# Patient Record
Sex: Male | Born: 1935 | Race: White | Hispanic: No | Marital: Married | Smoking: Former smoker
Health system: Southern US, Community
[De-identification: ages and names within clinical notes are randomized; demographics above are authoritative.]

## PROBLEM LIST (undated history)

## (undated) DIAGNOSIS — M19011 Primary osteoarthritis, right shoulder: Secondary | ICD-10-CM

## (undated) DIAGNOSIS — IMO0001 Reserved for inherently not codable concepts without codable children: Secondary | ICD-10-CM

## (undated) DIAGNOSIS — R2689 Other abnormalities of gait and mobility: Secondary | ICD-10-CM

## (undated) DIAGNOSIS — R058 Other specified cough: Secondary | ICD-10-CM

## (undated) DIAGNOSIS — R112 Nausea with vomiting, unspecified: Secondary | ICD-10-CM

## (undated) DIAGNOSIS — E785 Hyperlipidemia, unspecified: Secondary | ICD-10-CM

## (undated) DIAGNOSIS — M19012 Primary osteoarthritis, left shoulder: Secondary | ICD-10-CM

## (undated) DIAGNOSIS — J449 Chronic obstructive pulmonary disease, unspecified: Secondary | ICD-10-CM

## (undated) DIAGNOSIS — R251 Tremor, unspecified: Secondary | ICD-10-CM

## (undated) DIAGNOSIS — N400 Enlarged prostate without lower urinary tract symptoms: Secondary | ICD-10-CM

## (undated) DIAGNOSIS — J439 Emphysema, unspecified: Secondary | ICD-10-CM

## (undated) DIAGNOSIS — R350 Frequency of micturition: Secondary | ICD-10-CM

## (undated) DIAGNOSIS — I10 Essential (primary) hypertension: Secondary | ICD-10-CM

## (undated) DIAGNOSIS — J302 Other seasonal allergic rhinitis: Secondary | ICD-10-CM

## (undated) DIAGNOSIS — E039 Hypothyroidism, unspecified: Secondary | ICD-10-CM

## (undated) DIAGNOSIS — M2550 Pain in unspecified joint: Secondary | ICD-10-CM

## (undated) DIAGNOSIS — F419 Anxiety disorder, unspecified: Secondary | ICD-10-CM

## (undated) DIAGNOSIS — M1990 Unspecified osteoarthritis, unspecified site: Secondary | ICD-10-CM

## (undated) DIAGNOSIS — Z9289 Personal history of other medical treatment: Secondary | ICD-10-CM

## (undated) DIAGNOSIS — T8859XA Other complications of anesthesia, initial encounter: Secondary | ICD-10-CM

## (undated) DIAGNOSIS — T4145XA Adverse effect of unspecified anesthetic, initial encounter: Secondary | ICD-10-CM

## (undated) DIAGNOSIS — I2510 Atherosclerotic heart disease of native coronary artery without angina pectoris: Secondary | ICD-10-CM

## (undated) DIAGNOSIS — K5900 Constipation, unspecified: Secondary | ICD-10-CM

## (undated) DIAGNOSIS — R05 Cough: Secondary | ICD-10-CM

## (undated) DIAGNOSIS — Z9889 Other specified postprocedural states: Secondary | ICD-10-CM

## (undated) HISTORY — PX: CATARACT EXTRACTION: SUR2

## (undated) HISTORY — DX: Atherosclerotic heart disease of native coronary artery without angina pectoris: I25.10

## (undated) HISTORY — DX: Hyperlipidemia, unspecified: E78.5

## (undated) HISTORY — PX: HERNIA REPAIR: SHX51

---

## 1969-06-28 HISTORY — PX: OTHER SURGICAL HISTORY: SHX169

## 1993-08-28 HISTORY — PX: OTHER SURGICAL HISTORY: SHX169

## 2003-09-29 HISTORY — PX: LIPOMA EXCISION: SHX5283

## 2004-11-28 HISTORY — PX: PROSTATE BIOPSY: SHX241

## 2007-04-29 HISTORY — PX: COLONOSCOPY: SHX174

## 2008-11-28 HISTORY — PX: IRRIGATION AND DEBRIDEMENT SEBACEOUS CYST: SHX5255

## 2014-01-22 ENCOUNTER — Ambulatory Visit: Payer: Self-pay | Admitting: Cardiovascular Disease

## 2014-01-29 ENCOUNTER — Ambulatory Visit (INDEPENDENT_AMBULATORY_CARE_PROVIDER_SITE_OTHER): Payer: Managed Care, Other (non HMO) | Admitting: Cardiovascular Disease

## 2014-01-29 ENCOUNTER — Encounter: Payer: Self-pay | Admitting: Cardiovascular Disease

## 2014-01-29 VITALS — BP 121/70 | HR 62 | Ht 73.5 in | Wt 181.8 lb

## 2014-01-29 DIAGNOSIS — R079 Chest pain, unspecified: Secondary | ICD-10-CM

## 2014-01-29 DIAGNOSIS — R251 Tremor, unspecified: Secondary | ICD-10-CM

## 2014-01-29 DIAGNOSIS — R259 Unspecified abnormal involuntary movements: Secondary | ICD-10-CM

## 2014-01-29 DIAGNOSIS — I10 Essential (primary) hypertension: Secondary | ICD-10-CM | POA: Insufficient documentation

## 2014-01-29 DIAGNOSIS — I2510 Atherosclerotic heart disease of native coronary artery without angina pectoris: Secondary | ICD-10-CM | POA: Insufficient documentation

## 2014-01-29 MED ORDER — NITROGLYCERIN 0.4 MG SL SUBL: 0.4000 mg | SUBLINGUAL_TABLET | SUBLINGUAL | Status: DC | PRN

## 2014-01-29 NOTE — Assessment & Plan Note (Signed)
Fairly significant  In both Hands  Strong family history including brother with tremor No other signs of parkinsons  Offerred him referral to Dr Tat and he will let us know if he wants to be reevaluated

## 2014-01-29 NOTE — Assessment & Plan Note (Signed)
Distant history of cath with nonobstructive disease.  ASA and beta blocker.  No chest pain and normal ECG stable

## 2014-01-29 NOTE — Patient Instructions (Signed)
Your physician wants you to follow-up in:  6 MONTHS WITH DR NISHAN  You will receive a reminder letter in the mail two months in advance. If you don't receive a letter, please call our office to schedule the follow-up appointment. Your physician recommends that you continue on your current medications as directed. Please refer to the Current Medication list given to you today. 

## 2014-01-29 NOTE — Assessment & Plan Note (Signed)
Well controlled.  Continue current medications and low sodium Dash type diet.    

## 2014-01-29 NOTE — Progress Notes (Signed)
Patient ID: Jay Bradshaw, male   DOB: 06-Dec-1934, 79 y.o.   MRN: [id number]    79 yo moving from [place]  Wanted to "establish" with heart doctor.  Describes having cath about 8 years ago  No intervention done  ? Moderate disease.  No repeat cath since that time.  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  Has been on beta blocker for HTN and since heart cath.  Takes ASA  No dyspnea, palpitations or syncope Compliant with meds.  Does not smoke or drink  No chest pain      ROS: Denies fever, malais, weight loss, blurry vision, decreased visual acuity, cough, sputum, SOB, hemoptysis, pleuritic pain, palpitaitons, heartburn, abdominal pain, melena, lower extremity edema, claudication, or rash.  All other systems reviewed and negative   General: Affect appropriate Healthy:  appears stated age 79: normal Neck supple with no adenopathy JVP normal no bruits no thyromegaly Lungs clear with no wheezing and good diaphragmatic motion Heart:  S1/S2 no murmur,rub, gallop or click PMI normal Abdomen: benighn, BS positve, no tenderness, no AAA no bruit.  No HSM or HJR Distal pulses intact with no bruits No edema Neuro non-focal  Intention tremor both UEls  Skin warm and dry No muscular weakness  Medications Current Outpatient Prescriptions  Medication Sig Dispense Refill  . nitroGLYCERIN (NITROSTAT) 0.4 MG SL tablet Place 1 tablet (0.4 mg total) under the tongue every 5 (five) minutes as needed for chest pain.  25 tablet  4  . alendronate (FOSAMAX) 70 MG tablet Take 70 mg by mouth once a week. Take with a full glass of water on an empty stomach.      Marland Kitchen aspirin 81 MG tablet Take 81 mg by mouth daily.      Marland Kitchen CALCIUM-MAGNESIUM PO Take 500-600 mg by mouth 2 (two) times daily.      . Cholecalciferol (VITAMIN D-3 PO) Take 1,000 Units by mouth 2 (two)  times daily.      . Coenzyme Q10 (CO Q10) 100 MG CAPS Take 200 mg by mouth daily.      Marland Kitchen levothyroxine (SYNTHROID, LEVOTHROID) 88 MCG tablet Take 88 mcg by mouth daily before breakfast.      . lovastatin (MEVACOR) 20 MG tablet Take 20 mg by mouth at bedtime.      . LUTEIN PO Take 200 mg by mouth daily.      . metoprolol succinate (TOPROL-XL) 50 MG 24 hr tablet Take 50 mg by mouth daily. Take with or immediately following a meal.      . Multiple Vitamin (MULTIVITAMIN) capsule Take 1 capsule by mouth daily.      Marland Kitchen oxybutynin (DITROPAN) 5 MG tablet Take 2.5 mg by mouth 2 (two) times daily.      . PSYLLIUM HUSK PO Take by mouth. 3-6 per day      . saw palmetto 160 MG capsule Take 160 mg by mouth 3 (three) times daily.      . tamsulosin (FLOMAX) 0.4 MG CAPS capsule Take 0.4 mg by mouth daily.      . vitamin C (ASCORBIC ACID) 500 MG tablet Take 500 mg by mouth daily.      . vitamin E (VITAMIN E) 400 UNIT capsule Take 300 Units by mouth daily.       No  current facility-administered medications for this visit.    Allergies Review of patient's allergies indicates no known allergies.  Family History: Family History  Problem Relation Age of Onset  . Hypertension Mother     Social History: History   Social History  . Marital Status: Married    Spouse Name: N/A    Number of Children: N/A  . Years of Education: N/A   Occupational History  . Not on file.   Social History Main Topics  . Smoking status: Former Research scientist (life sciences)  . Smokeless tobacco: Not on file     Comment: quit 1977  . Alcohol Use: Not on file  . Drug Use: Not on file  . Sexual Activity: Not on file   Other Topics Concern  . Not on file   Social History Narrative  . No narrative on file    Electrocardiogram:  SR rate 62 normal ECG   Assessment and Plan

## 2014-08-18 ENCOUNTER — Ambulatory Visit: Payer: Managed Care, Other (non HMO) | Admitting: Cardiovascular Disease

## 2014-08-28 ENCOUNTER — Other Ambulatory Visit: Payer: Self-pay | Admitting: Orthopedic Surgery

## 2014-08-28 DIAGNOSIS — M25512 Pain in left shoulder: Secondary | ICD-10-CM

## 2014-08-28 DIAGNOSIS — M25511 Pain in right shoulder: Secondary | ICD-10-CM

## 2014-09-03 ENCOUNTER — Other Ambulatory Visit: Payer: Managed Care, Other (non HMO)

## 2014-09-03 ENCOUNTER — Ambulatory Visit
Admission: RE | Admit: 2014-09-03 | Discharge: 2014-09-03 | Disposition: A | Discharge status: Home / Self Care | Payer: Managed Care, Other (non HMO) | Source: Ambulatory Visit | Attending: Orthopedic Surgery | Admitting: Orthopedic Surgery

## 2014-09-03 ENCOUNTER — Other Ambulatory Visit: Payer: Self-pay | Admitting: Orthopedic Surgery

## 2014-09-03 DIAGNOSIS — M25512 Pain in left shoulder: Secondary | ICD-10-CM

## 2014-09-03 DIAGNOSIS — M25511 Pain in right shoulder: Secondary | ICD-10-CM

## 2014-09-17 ENCOUNTER — Encounter: Payer: Self-pay | Admitting: Cardiovascular Disease

## 2014-09-17 ENCOUNTER — Ambulatory Visit (INDEPENDENT_AMBULATORY_CARE_PROVIDER_SITE_OTHER): Payer: Managed Care, Other (non HMO) | Admitting: Cardiovascular Disease

## 2014-09-17 VITALS — BP 122/58 | HR 63 | Ht 73.0 in | Wt 176.0 lb

## 2014-09-17 DIAGNOSIS — Z01810 Encounter for preprocedural cardiovascular examination: Secondary | ICD-10-CM

## 2014-09-17 DIAGNOSIS — R251 Tremor, unspecified: Secondary | ICD-10-CM

## 2014-09-17 DIAGNOSIS — I2583 Coronary atherosclerosis due to lipid rich plaque: Secondary | ICD-10-CM

## 2014-09-17 DIAGNOSIS — I2510 Atherosclerotic heart disease of native coronary artery without angina pectoris: Secondary | ICD-10-CM

## 2014-09-17 DIAGNOSIS — I159 Secondary hypertension, unspecified: Secondary | ICD-10-CM

## 2014-09-17 NOTE — Assessment & Plan Note (Signed)
Well controlled.  Continue current medications and low sodium Dash type diet.    

## 2014-09-17 NOTE — Assessment & Plan Note (Signed)
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

## 2014-09-17 NOTE — Assessment & Plan Note (Signed)
Stable with no angina and good activity level.  Continue medical Rx Never documented to be obstructive

## 2014-09-17 NOTE — Assessment & Plan Note (Signed)
No symptoms, normal ECG and cardiac exam clear to have general anesthesia and shoulder surgery with DR Mardelle Matte

## 2014-09-17 NOTE — Patient Instructions (Signed)
Your physician wants you to follow-up in: YEAR WITH DR NISHAN  You will receive a reminder letter in the mail two months in advance. If you don't receive a letter, please call our office to schedule the follow-up appointment.  Your physician recommends that you continue on your current medications as directed. Please refer to the Current Medication list given to you today. 

## 2014-09-17 NOTE — Progress Notes (Signed)
Patient ID: Jay Bradshaw, male   DOB: 1935-03-15, 79 y.o.   MRN: [id number] 79 yo moving from [place] Wanted to "establish" with heart doctor. Describes having cath about 8 years ago No intervention done ? Moderate disease. No repeat cath since that time. 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 Has been on beta blocker for HTN and since heart cath. Takes ASA No dyspnea, palpitations or syncope Compliant with meds. Does not smoke or drink No chest pain    2/15 TC 147  LDL 87  Normal LFTls   Needs shoulder surgery with Dr Mardelle Matte in January Ok from cardiac standpoint    ROS: Denies fever, malais, weight loss, blurry vision, decreased visual acuity, cough, sputum, SOB, hemoptysis, pleuritic pain, palpitaitons, heartburn, abdominal pain, melena, lower extremity edema, claudication, or rash.  All other systems reviewed and negative  General: Affect appropriate Healthy:  appears stated age 79: normal Neck supple with no adenopathy JVP normal no bruits no thyromegaly Lungs clear with no wheezing and good diaphragmatic motion Heart:  S1/S2 no murmur, no rub, gallop or click PMI normal Abdomen: benighn, BS positve, no tenderness, no AAA no bruit.  No HSM or HJR Distal pulses intact with no bruits No edema Neuro non-focal UE tremor  Skin warm and dry Decreased ROM UE;s right greater than left    Current Outpatient Prescriptions  Medication Sig Dispense Refill  . alendronate (FOSAMAX) 70 MG tablet Take 70 mg by mouth once a week. Take with a full glass of water on an empty stomach.      Marland Kitchen aspirin 81 MG tablet Take 81 mg by mouth daily.      Marland Kitchen CALCIUM-MAGNESIUM PO Take 500-600 mg by mouth 2 (two) times daily.      . Cholecalciferol (VITAMIN D-3 PO) Take 1,000 Units by mouth 2 (two) times daily.      . Coenzyme Q10 (CO Q10) 100 MG CAPS Take 200 mg  by mouth daily.      Marland Kitchen levothyroxine (SYNTHROID, LEVOTHROID) 88 MCG tablet Take 88 mcg by mouth daily before breakfast.      . lovastatin (MEVACOR) 20 MG tablet Take 20 mg by mouth at bedtime.      . LUTEIN PO Take 200 mg by mouth daily.      . metoprolol succinate (TOPROL-XL) 50 MG 24 hr tablet Take 50 mg by mouth daily. Take with or immediately following a meal.      . Multiple Vitamin (MULTIVITAMIN) capsule Take 1 capsule by mouth daily.      . nitroGLYCERIN (NITROSTAT) 0.4 MG SL tablet Place 1 tablet (0.4 mg total) under the tongue every 5 (five) minutes as needed for chest pain.  25 tablet  4  . oxybutynin (DITROPAN) 5 MG tablet Take 2.5 mg by mouth 2 (two) times daily.      . PSYLLIUM HUSK PO Take by mouth. 3-6 per day      . saw palmetto 160 MG capsule Take 160 mg by mouth 3 (three) times daily.      . tamsulosin (FLOMAX) 0.4 MG CAPS capsule Take 0.4 mg by mouth daily.      . vitamin C (ASCORBIC ACID) 500 MG tablet Take 500 mg by mouth daily.      . vitamin E (VITAMIN E) 400 UNIT capsule Take 300 Units  by mouth daily.       No current facility-administered medications for this visit.    Allergies  Review of patient's allergies indicates no known allergies.  Electrocardiogram:  NSR normal ECG 2014  Today NSR rate 61 normal no change   Assessment and Plan

## 2014-09-18 ENCOUNTER — Telehealth: Payer: Self-pay | Admitting: Cardiovascular Disease

## 2014-09-18 NOTE — Telephone Encounter (Signed)
Received request from Nurse fax box, documents faxed for surgical clearance. To: Raliegh Ip  Fax number: [phone] Attention:  [phone]/[id number].[place]

## 2014-11-17 ENCOUNTER — Telehealth: Payer: Self-pay | Admitting: Cardiovascular Disease

## 2014-11-17 NOTE — Telephone Encounter (Signed)
ROI faxed to [facility] Dr.[facility] @ [phone] [place]

## 2014-12-02 ENCOUNTER — Telehealth: Payer: Self-pay | Admitting: Cardiovascular Disease

## 2014-12-02 NOTE — Telephone Encounter (Signed)
Records received from [facility] gave to Chart prep team

## 2014-12-02 NOTE — Pre-Procedure Instructions (Signed)
Jay Bradshaw  12/02/2014   Your procedure is scheduled on:  12/16/14  Report to [facility] Admitting at 530 AM.  Call this number if you have problems the morning of surgery: [phone]   Remember:   Do not eat food or drink liquids after midnight.   Take these medicines the morning of surgery with A SIP OF WATER: synthroid,metoprolol,ditropan,flomax   Do not wear jewelry, make-up or nail polish.  Do not wear lotions, powders, or perfumes. You may wear deodorant.  Do not shave 48 hours prior to surgery. Men may shave face and neck.  Do not bring valuables to the hospital.  [facility] is not responsible                  for any belongings or valuables.               Contacts, dentures or bridgework may not be worn into surgery.  Leave suitcase in the car. After surgery it may be brought to your room.  For patients admitted to the hospital, discharge time is determined by your                treatment team.               Patients discharged the day of surgery will not be allowed to drive  home.  Name and phone number of your driver: family  Special Instructions: Incentive Spirometry - Practice and bring it with you on the day of surgery.   Please read over the following fact sheets that you were given: Pain Booklet, Coughing and Deep Breathing, Blood Transfusion Information and MRSA Information

## 2014-12-03 ENCOUNTER — Encounter (HOSPITAL_COMMUNITY): Payer: Self-pay

## 2014-12-03 ENCOUNTER — Encounter (HOSPITAL_COMMUNITY)
Admission: RE | Admit: 2014-12-03 | Discharge: 2014-12-03 | Disposition: A | Discharge status: Home / Self Care | Payer: Medicare HMO | Source: Ambulatory Visit | Attending: Orthopedic Surgery | Admitting: Orthopedic Surgery

## 2014-12-03 DIAGNOSIS — Z01812 Encounter for preprocedural laboratory examination: Secondary | ICD-10-CM | POA: Insufficient documentation

## 2014-12-03 HISTORY — DX: Unspecified osteoarthritis, unspecified site: M19.90

## 2014-12-03 HISTORY — DX: Tremor, unspecified: R25.1

## 2014-12-03 HISTORY — DX: Chronic obstructive pulmonary disease, unspecified: J44.9

## 2014-12-03 HISTORY — DX: Reserved for inherently not codable concepts without codable children: IMO0001

## 2014-12-03 HISTORY — DX: Hypothyroidism, unspecified: E03.9

## 2014-12-03 LAB — CBC
HCT: 41.7 % (ref 39.0–52.0)
HEMOGLOBIN: 13.9 g/dL (ref 13.0–17.0)
MCH: 30.6 pg (ref 26.0–34.0)
MCHC: 33.3 g/dL (ref 30.0–36.0)
MCV: 91.9 fL (ref 78.0–100.0)
Platelets: 258 10*3/uL (ref 150–400)
RBC: 4.54 MIL/uL (ref 4.22–5.81)
RDW: 13.6 % (ref 11.5–15.5)
WBC: 6.7 10*3/uL (ref 4.0–10.5)

## 2014-12-03 LAB — BASIC METABOLIC PANEL
Anion gap: 5 (ref 5–15)
BUN: 25 mg/dL — ABNORMAL HIGH (ref 6–23)
CALCIUM: 9.2 mg/dL (ref 8.4–10.5)
CHLORIDE: 106 meq/L (ref 96–112)
CO2: 29 mmol/L (ref 19–32)
Creatinine, Ser: 0.98 mg/dL (ref 0.50–1.35)
GFR calc Af Amer: 90 mL/min — ABNORMAL LOW (ref >90)
GFR, EST NON AFRICAN AMERICAN: 77 mL/min — AB (ref >90)
Glucose, Bld: 89 mg/dL (ref 70–99)
Potassium: 4.6 mmol/L (ref 3.5–5.1)
SODIUM: 140 mmol/L (ref 135–145)

## 2014-12-03 LAB — SURGICAL PCR SCREEN
MRSA, PCR: NEGATIVE
STAPHYLOCOCCUS AUREUS: POSITIVE — AB

## 2014-12-15 MED ORDER — CEFAZOLIN SODIUM-DEXTROSE 2-3 GM-% IV SOLR
2.0000 g | INTRAVENOUS | Status: AC
Start: 2014-12-16 — End: 2014-12-16
  Administered 2014-12-16: 2 g via INTRAVENOUS
  Filled 2014-12-15: qty 50

## 2014-12-16 ENCOUNTER — Inpatient Hospital Stay (HOSPITAL_COMMUNITY): Payer: Medicare HMO

## 2014-12-16 ENCOUNTER — Inpatient Hospital Stay (HOSPITAL_COMMUNITY)
Admission: RE | Admit: 2014-12-16 | Discharge: 2014-12-18 | DRG: 483 | Disposition: A | Discharge status: Home Health | Payer: Medicare HMO | Source: Ambulatory Visit | Attending: Orthopedic Surgery | Admitting: Orthopedic Surgery

## 2014-12-16 ENCOUNTER — Inpatient Hospital Stay (HOSPITAL_COMMUNITY): Payer: Medicare HMO | Admitting: Anesthesiology

## 2014-12-16 ENCOUNTER — Encounter (HOSPITAL_COMMUNITY): Payer: Self-pay | Admitting: *Deleted

## 2014-12-16 ENCOUNTER — Encounter (HOSPITAL_COMMUNITY)
Admission: RE | Disposition: A | Discharge status: Home Health | Payer: Self-pay | Source: Ambulatory Visit | Attending: Orthopedic Surgery

## 2014-12-16 DIAGNOSIS — Z79899 Other long term (current) drug therapy: Secondary | ICD-10-CM | POA: Diagnosis not present

## 2014-12-16 DIAGNOSIS — Z7982 Long term (current) use of aspirin: Secondary | ICD-10-CM

## 2014-12-16 DIAGNOSIS — R338 Other retention of urine: Secondary | ICD-10-CM | POA: Diagnosis present

## 2014-12-16 DIAGNOSIS — I10 Essential (primary) hypertension: Secondary | ICD-10-CM | POA: Diagnosis present

## 2014-12-16 DIAGNOSIS — Z87891 Personal history of nicotine dependence: Secondary | ICD-10-CM | POA: Diagnosis not present

## 2014-12-16 DIAGNOSIS — N9989 Other postprocedural complications and disorders of genitourinary system: Secondary | ICD-10-CM | POA: Diagnosis present

## 2014-12-16 DIAGNOSIS — Z96611 Presence of right artificial shoulder joint: Secondary | ICD-10-CM

## 2014-12-16 DIAGNOSIS — Z8249 Family history of ischemic heart disease and other diseases of the circulatory system: Secondary | ICD-10-CM

## 2014-12-16 DIAGNOSIS — Z96619 Presence of unspecified artificial shoulder joint: Secondary | ICD-10-CM

## 2014-12-16 DIAGNOSIS — I2510 Atherosclerotic heart disease of native coronary artery without angina pectoris: Secondary | ICD-10-CM | POA: Diagnosis present

## 2014-12-16 DIAGNOSIS — J449 Chronic obstructive pulmonary disease, unspecified: Secondary | ICD-10-CM | POA: Diagnosis present

## 2014-12-16 DIAGNOSIS — M19011 Primary osteoarthritis, right shoulder: Secondary | ICD-10-CM | POA: Diagnosis present

## 2014-12-16 DIAGNOSIS — E785 Hyperlipidemia, unspecified: Secondary | ICD-10-CM | POA: Diagnosis present

## 2014-12-16 DIAGNOSIS — E039 Hypothyroidism, unspecified: Secondary | ICD-10-CM | POA: Diagnosis present

## 2014-12-16 HISTORY — PX: SHOULDER HEMI-ARTHROPLASTY: SHX5049

## 2014-12-16 HISTORY — PX: SHOULDER SURGERY: SHX246

## 2014-12-16 HISTORY — DX: Primary osteoarthritis, right shoulder: M19.011

## 2014-12-16 HISTORY — DX: Other complications of anesthesia, initial encounter: T88.59XA

## 2014-12-16 HISTORY — DX: Adverse effect of unspecified anesthetic, initial encounter: T41.45XA

## 2014-12-16 HISTORY — PX: TOTAL SHOULDER ARTHROPLASTY: SHX126

## 2014-12-16 HISTORY — DX: Other specified postprocedural states: Z98.890

## 2014-12-16 HISTORY — DX: Nausea with vomiting, unspecified: R11.2

## 2014-12-16 SURGERY — ARTHROPLASTY, SHOULDER, TOTAL
Anesthesia: Regional | Site: Shoulder | Laterality: Right

## 2014-12-16 MED ORDER — METOCLOPRAMIDE HCL 5 MG/ML IJ SOLN: 5.0000 mg | Freq: Three times a day (TID) | INTRAMUSCULAR | Status: DC | PRN

## 2014-12-16 MED ORDER — POTASSIUM CHLORIDE IN NACL 20-0.45 MEQ/L-% IV SOLN
INTRAVENOUS | Status: DC
  Administered 2014-12-16: 20:00:00 via INTRAVENOUS
  Filled 2014-12-16 (×6): qty 1000

## 2014-12-16 MED ORDER — ONDANSETRON HCL 4 MG/2ML IJ SOLN
INTRAMUSCULAR | Status: AC
  Filled 2014-12-16: qty 2

## 2014-12-16 MED ORDER — LEVOTHYROXINE SODIUM 88 MCG PO TABS
88.0000 ug | ORAL_TABLET | Freq: Every day | ORAL | Status: DC
  Administered 2014-12-16 – 2014-12-17 (×2): 88 ug via ORAL
  Filled 2014-12-16 (×4): qty 1

## 2014-12-16 MED ORDER — CEFAZOLIN SODIUM 1-5 GM-% IV SOLN
1.0000 g | Freq: Four times a day (QID) | INTRAVENOUS | Status: AC
  Administered 2014-12-16 – 2014-12-17 (×3): 1 g via INTRAVENOUS
  Filled 2014-12-16 (×3): qty 50

## 2014-12-16 MED ORDER — VITAMIN E 45 MG (100 UNIT) PO CAPS
300.0000 [IU] | ORAL_CAPSULE | ORAL | Status: DC
  Administered 2014-12-17: 300 [IU] via ORAL
  Filled 2014-12-16: qty 3

## 2014-12-16 MED ORDER — MIDAZOLAM HCL 5 MG/5ML IJ SOLN
INTRAMUSCULAR | Status: DC | PRN
  Administered 2014-12-16 (×2): 1 mg via INTRAVENOUS

## 2014-12-16 MED ORDER — LIDOCAINE HCL (CARDIAC) 20 MG/ML IV SOLN
INTRAVENOUS | Status: DC | PRN
  Administered 2014-12-16: 100 mg via INTRAVENOUS

## 2014-12-16 MED ORDER — GLYCOPYRROLATE 0.2 MG/ML IJ SOLN
INTRAMUSCULAR | Status: AC
  Filled 2014-12-16: qty 2

## 2014-12-16 MED ORDER — FENTANYL CITRATE 0.05 MG/ML IJ SOLN
INTRAMUSCULAR | Status: AC
  Filled 2014-12-16: qty 5

## 2014-12-16 MED ORDER — LIDOCAINE HCL (CARDIAC) 20 MG/ML IV SOLN
INTRAVENOUS | Status: AC
  Filled 2014-12-16: qty 5

## 2014-12-16 MED ORDER — ONDANSETRON HCL 4 MG PO TABS
4.0000 mg | ORAL_TABLET | Freq: Four times a day (QID) | ORAL | Status: DC | PRN
  Administered 2014-12-18: 4 mg via ORAL
  Filled 2014-12-16: qty 1

## 2014-12-16 MED ORDER — MAGNESIUM CITRATE PO SOLN: 1.0000 | Freq: Once | ORAL | Status: AC | PRN

## 2014-12-16 MED ORDER — METHOCARBAMOL 1000 MG/10ML IJ SOLN: 500.0000 mg | Freq: Four times a day (QID) | INTRAVENOUS | Status: DC | PRN

## 2014-12-16 MED ORDER — OXYBUTYNIN CHLORIDE 5 MG PO TABS
2.5000 mg | ORAL_TABLET | Freq: Two times a day (BID) | ORAL | Status: DC
  Filled 2014-12-16 (×7): qty 0.5

## 2014-12-16 MED ORDER — DEXAMETHASONE SODIUM PHOSPHATE 4 MG/ML IJ SOLN
INTRAMUSCULAR | Status: DC | PRN
  Administered 2014-12-16: 8 mg via INTRAVENOUS

## 2014-12-16 MED ORDER — GLYCOPYRROLATE 0.2 MG/ML IJ SOLN
INTRAMUSCULAR | Status: DC | PRN
  Administered 2014-12-16: 0.2 mg via INTRAVENOUS
  Administered 2014-12-16: 0.4 mg via INTRAVENOUS

## 2014-12-16 MED ORDER — MELATONIN 3 MG PO TABS: 1.0000 | ORAL_TABLET | Freq: Every evening | ORAL | Status: DC | PRN

## 2014-12-16 MED ORDER — ACETAMINOPHEN 650 MG RE SUPP: 650.0000 mg | Freq: Four times a day (QID) | RECTAL | Status: DC | PRN

## 2014-12-16 MED ORDER — METOPROLOL SUCCINATE ER 50 MG PO TB24
50.0000 mg | ORAL_TABLET | Freq: Every day | ORAL | Status: DC
  Administered 2014-12-16 – 2014-12-18 (×3): 50 mg via ORAL
  Filled 2014-12-16 (×3): qty 1

## 2014-12-16 MED ORDER — EPHEDRINE SULFATE 50 MG/ML IJ SOLN
INTRAMUSCULAR | Status: DC | PRN
  Administered 2014-12-16 (×5): 10 mg via INTRAVENOUS

## 2014-12-16 MED ORDER — ONDANSETRON HCL 4 MG/2ML IJ SOLN
4.0000 mg | Freq: Four times a day (QID) | INTRAMUSCULAR | Status: DC | PRN
  Administered 2014-12-16: 4 mg via INTRAVENOUS
  Filled 2014-12-16: qty 2

## 2014-12-16 MED ORDER — NITROGLYCERIN 0.4 MG SL SUBL: 0.4000 mg | SUBLINGUAL_TABLET | SUBLINGUAL | Status: DC | PRN

## 2014-12-16 MED ORDER — FENTANYL CITRATE 0.05 MG/ML IJ SOLN
INTRAMUSCULAR | Status: DC | PRN
  Administered 2014-12-16 (×2): 50 ug via INTRAVENOUS

## 2014-12-16 MED ORDER — MENTHOL 3 MG MT LOZG
1.0000 | LOZENGE | OROMUCOSAL | Status: DC | PRN
  Administered 2014-12-16: 1 via ORAL
  Filled 2014-12-16: qty 9

## 2014-12-16 MED ORDER — SENNA-DOCUSATE SODIUM 8.6-50 MG PO TABS: 2.0000 | ORAL_TABLET | Freq: Every day | ORAL | Status: DC

## 2014-12-16 MED ORDER — VITAMIN C 500 MG PO TABS
500.0000 mg | ORAL_TABLET | ORAL | Status: DC
  Administered 2014-12-17: 500 mg via ORAL
  Filled 2014-12-16: qty 1

## 2014-12-16 MED ORDER — OXYCODONE-ACETAMINOPHEN 10-325 MG PO TABS: 1.0000 | ORAL_TABLET | Freq: Four times a day (QID) | ORAL | Status: DC | PRN

## 2014-12-16 MED ORDER — NEOSTIGMINE METHYLSULFATE 10 MG/10ML IV SOLN
INTRAVENOUS | Status: DC | PRN
  Administered 2014-12-16: 3 mg via INTRAVENOUS

## 2014-12-16 MED ORDER — NEOSTIGMINE METHYLSULFATE 10 MG/10ML IV SOLN
INTRAVENOUS | Status: AC
  Filled 2014-12-16: qty 1

## 2014-12-16 MED ORDER — MIDAZOLAM HCL 2 MG/2ML IJ SOLN
INTRAMUSCULAR | Status: AC
  Filled 2014-12-16: qty 2

## 2014-12-16 MED ORDER — BUPIVACAINE-EPINEPHRINE (PF) 0.25% -1:200000 IJ SOLN
INTRAMUSCULAR | Status: AC
  Filled 2014-12-16: qty 30

## 2014-12-16 MED ORDER — ONDANSETRON HCL 4 MG/2ML IJ SOLN
INTRAMUSCULAR | Status: DC | PRN
  Administered 2014-12-16: 4 mg via INTRAVENOUS

## 2014-12-16 MED ORDER — SODIUM CHLORIDE 0.9 % IJ SOLN
INTRAMUSCULAR | Status: AC
  Filled 2014-12-16: qty 10

## 2014-12-16 MED ORDER — HYDROMORPHONE HCL 1 MG/ML IJ SOLN: 0.2500 mg | INTRAMUSCULAR | Status: DC | PRN

## 2014-12-16 MED ORDER — TAMSULOSIN HCL 0.4 MG PO CAPS
0.4000 mg | ORAL_CAPSULE | Freq: Every day | ORAL | Status: DC
  Administered 2014-12-17 – 2014-12-18 (×2): 0.4 mg via ORAL
  Filled 2014-12-16 (×3): qty 1

## 2014-12-16 MED ORDER — OXYCODONE HCL 5 MG PO TABS
5.0000 mg | ORAL_TABLET | ORAL | Status: DC | PRN
  Administered 2014-12-16 – 2014-12-17 (×4): 10 mg via ORAL
  Administered 2014-12-17 – 2014-12-18 (×3): 5 mg via ORAL
  Administered 2014-12-18: 10 mg via ORAL
  Filled 2014-12-16: qty 1
  Filled 2014-12-16: qty 2
  Filled 2014-12-16: qty 1
  Filled 2014-12-16 (×4): qty 2
  Filled 2014-12-16: qty 1

## 2014-12-16 MED ORDER — SENNA 8.6 MG PO TABS
1.0000 | ORAL_TABLET | Freq: Two times a day (BID) | ORAL | Status: DC
  Administered 2014-12-16 – 2014-12-18 (×4): 8.6 mg via ORAL
  Filled 2014-12-16 (×6): qty 1

## 2014-12-16 MED ORDER — ONDANSETRON HCL 4 MG PO TABS: 4.0000 mg | ORAL_TABLET | Freq: Three times a day (TID) | ORAL | Status: DC | PRN

## 2014-12-16 MED ORDER — BACLOFEN 10 MG PO TABS: 10.0000 mg | ORAL_TABLET | Freq: Three times a day (TID) | ORAL | Status: DC

## 2014-12-16 MED ORDER — PRAVASTATIN SODIUM 20 MG PO TABS
20.0000 mg | ORAL_TABLET | Freq: Every day | ORAL | Status: DC
  Administered 2014-12-17: 20 mg via ORAL
  Filled 2014-12-16 (×3): qty 1

## 2014-12-16 MED ORDER — DIPHENHYDRAMINE HCL 12.5 MG/5ML PO ELIX: 12.5000 mg | ORAL_SOLUTION | ORAL | Status: DC | PRN

## 2014-12-16 MED ORDER — ROCURONIUM BROMIDE 100 MG/10ML IV SOLN
INTRAVENOUS | Status: DC | PRN
  Administered 2014-12-16: 10 mg via INTRAVENOUS
  Administered 2014-12-16: 30 mg via INTRAVENOUS

## 2014-12-16 MED ORDER — METHOCARBAMOL 500 MG PO TABS
500.0000 mg | ORAL_TABLET | Freq: Four times a day (QID) | ORAL | Status: DC | PRN
  Administered 2014-12-16 – 2014-12-17 (×2): 500 mg via ORAL
  Filled 2014-12-16 (×2): qty 1

## 2014-12-16 MED ORDER — DOCUSATE SODIUM 100 MG PO CAPS
100.0000 mg | ORAL_CAPSULE | Freq: Two times a day (BID) | ORAL | Status: DC
  Administered 2014-12-16 – 2014-12-18 (×4): 100 mg via ORAL
  Filled 2014-12-16 (×4): qty 1

## 2014-12-16 MED ORDER — METHOCARBAMOL 1000 MG/10ML IJ SOLN
500.0000 mg | INTRAVENOUS | Status: DC
  Filled 2014-12-16: qty 5

## 2014-12-16 MED ORDER — HYDROMORPHONE HCL 1 MG/ML IJ SOLN: 0.5000 mg | INTRAMUSCULAR | Status: DC | PRN

## 2014-12-16 MED ORDER — ACETAMINOPHEN 325 MG PO TABS
650.0000 mg | ORAL_TABLET | Freq: Four times a day (QID) | ORAL | Status: DC | PRN
  Administered 2014-12-17: 650 mg via ORAL
  Filled 2014-12-16: qty 2

## 2014-12-16 MED ORDER — PHENYLEPHRINE HCL 10 MG/ML IJ SOLN
INTRAMUSCULAR | Status: DC | PRN
  Administered 2014-12-16 (×2): 80 ug via INTRAVENOUS

## 2014-12-16 MED ORDER — PROPOFOL 10 MG/ML IV BOLUS
INTRAVENOUS | Status: DC | PRN
  Administered 2014-12-16: 150 mg via INTRAVENOUS

## 2014-12-16 MED ORDER — POLYETHYLENE GLYCOL 3350 17 G PO PACK: 17.0000 g | PACK | Freq: Every day | ORAL | Status: DC | PRN

## 2014-12-16 MED ORDER — METOCLOPRAMIDE HCL 5 MG PO TABS
5.0000 mg | ORAL_TABLET | Freq: Three times a day (TID) | ORAL | Status: DC | PRN
  Filled 2014-12-16: qty 2

## 2014-12-16 MED ORDER — BISACODYL 10 MG RE SUPP: 10.0000 mg | Freq: Every day | RECTAL | Status: DC | PRN

## 2014-12-16 MED ORDER — PROPOFOL 10 MG/ML IV BOLUS
INTRAVENOUS | Status: AC
  Filled 2014-12-16: qty 20

## 2014-12-16 MED ORDER — PHENOL 1.4 % MT LIQD: 1.0000 | OROMUCOSAL | Status: DC | PRN

## 2014-12-16 MED ORDER — LACTATED RINGERS IV SOLN
INTRAVENOUS | Status: DC | PRN
  Administered 2014-12-16 (×2): via INTRAVENOUS

## 2014-12-16 MED ORDER — EPHEDRINE SULFATE 50 MG/ML IJ SOLN
INTRAMUSCULAR | Status: AC
  Filled 2014-12-16: qty 1

## 2014-12-16 MED ORDER — ROCURONIUM BROMIDE 50 MG/5ML IV SOLN
INTRAVENOUS | Status: AC
  Filled 2014-12-16: qty 1

## 2014-12-16 MED ORDER — PHENYLEPHRINE HCL 10 MG/ML IJ SOLN
10.0000 mg | INTRAVENOUS | Status: DC | PRN
  Administered 2014-12-16: 50 ug/min via INTRAVENOUS

## 2014-12-16 MED ORDER — ASPIRIN 81 MG PO CHEW
81.0000 mg | CHEWABLE_TABLET | Freq: Every day | ORAL | Status: DC
Start: 2014-12-16 — End: 2014-12-18
  Administered 2014-12-16 – 2014-12-18 (×3): 81 mg via ORAL
  Filled 2014-12-16 (×3): qty 1

## 2014-12-16 MED ORDER — ALUM & MAG HYDROXIDE-SIMETH 200-200-20 MG/5ML PO SUSP: 30.0000 mL | ORAL | Status: DC | PRN

## 2014-12-16 MED ORDER — ALENDRONATE SODIUM 70 MG PO TABS: 70.0000 mg | ORAL_TABLET | ORAL | Status: DC

## 2014-12-16 MED ORDER — DEXAMETHASONE SODIUM PHOSPHATE 4 MG/ML IJ SOLN
INTRAMUSCULAR | Status: AC
  Filled 2014-12-16: qty 2

## 2014-12-16 MED ORDER — SODIUM CHLORIDE 0.9 % IR SOLN
Status: DC | PRN
  Administered 2014-12-16: 1000 mL

## 2014-12-16 MED ORDER — OXYCODONE-ACETAMINOPHEN 5-325 MG PO TABS
1.0000 | ORAL_TABLET | ORAL | Status: DC | PRN
  Administered 2014-12-17: 2 via ORAL
  Filled 2014-12-16 (×2): qty 2

## 2014-12-16 SURGICAL SUPPLY — 66 items
BENZOIN TINCTURE PRP APPL 2/3 (GAUZE/BANDAGES/DRESSINGS) ×2 IMPLANT
BIT DRILL 5/64X5 DISP (BIT) ×2 IMPLANT
BLADE SAW SGTL MED 73X18.5 STR (BLADE) ×2 IMPLANT
BOWL SMART MIX CTS (DISPOSABLE) IMPLANT
BRUSH FEMORAL CANAL (MISCELLANEOUS) IMPLANT
CAPT SHOULDER PARTIAL 2 ×2 IMPLANT
CLSR STERI-STRIP ANTIMIC 1/2X4 (GAUZE/BANDAGES/DRESSINGS) ×2 IMPLANT
COVER SURGICAL LIGHT HANDLE (MISCELLANEOUS) ×2 IMPLANT
COVER TABLE BACK 60X90 (DRAPES) IMPLANT
DRAPE C-ARM 42X72 X-RAY (DRAPES) IMPLANT
DRAPE IMP U-DRAPE 54X76 (DRAPES) ×2 IMPLANT
DRAPE INCISE IOBAN 66X45 STRL (DRAPES) ×2 IMPLANT
DRAPE PROXIMA HALF (DRAPES) ×2 IMPLANT
DRAPE U-SHAPE 47X51 STRL (DRAPES) ×2 IMPLANT
DRSG MEPILEX BORDER 4X8 (GAUZE/BANDAGES/DRESSINGS) ×2 IMPLANT
DURAPREP 26ML APPLICATOR (WOUND CARE) ×2 IMPLANT
ELECT REM PT RETURN 9FT ADLT (ELECTROSURGICAL) ×2
ELECTRODE REM PT RTRN 9FT ADLT (ELECTROSURGICAL) ×1 IMPLANT
EVACUATOR 1/8 PVC DRAIN (DRAIN) IMPLANT
FACESHIELD WRAPAROUND (MASK) IMPLANT
GAUZE SPONGE 4X4 12PLY STRL (GAUZE/BANDAGES/DRESSINGS) ×2 IMPLANT
GLOVE BIOGEL PI IND STRL 8 (GLOVE) ×1 IMPLANT
GLOVE BIOGEL PI INDICATOR 8 (GLOVE) ×1
GLOVE BIOGEL PI ORTHO PRO SZ8 (GLOVE) ×1
GLOVE ORTHO TXT STRL SZ7.5 (GLOVE) ×6 IMPLANT
GLOVE PI ORTHO PRO STRL SZ8 (GLOVE) ×1 IMPLANT
GLOVE SURG ORTHO 8.0 STRL STRW (GLOVE) ×4 IMPLANT
GOWN STRL REUS W/ TWL LRG LVL3 (GOWN DISPOSABLE) ×1 IMPLANT
GOWN STRL REUS W/ TWL XL LVL3 (GOWN DISPOSABLE) ×1 IMPLANT
GOWN STRL REUS W/TWL 2XL LVL3 (GOWN DISPOSABLE) ×2 IMPLANT
GOWN STRL REUS W/TWL LRG LVL3 (GOWN DISPOSABLE) ×1
GOWN STRL REUS W/TWL XL LVL3 (GOWN DISPOSABLE) ×1
HANDPIECE INTERPULSE COAX TIP (DISPOSABLE)
HOOD PEEL AWAY FACE SHEILD DIS (HOOD) ×4 IMPLANT
KIT BASIN OR (CUSTOM PROCEDURE TRAY) ×2 IMPLANT
KIT ROOM TURNOVER OR (KITS) ×2 IMPLANT
MANIFOLD NEPTUNE II (INSTRUMENTS) ×2 IMPLANT
NEEDLE 1/2 CIR CATGUT .05X1.09 (NEEDLE) IMPLANT
NEEDLE HYPO 25GX1X1/2 BEV (NEEDLE) IMPLANT
NS IRRIG 1000ML POUR BTL (IV SOLUTION) ×2 IMPLANT
PACK SHOULDER (CUSTOM PROCEDURE TRAY) ×2 IMPLANT
PACK UNIVERSAL I (CUSTOM PROCEDURE TRAY) ×2 IMPLANT
PAD ARMBOARD 7.5X6 YLW CONV (MISCELLANEOUS) ×4 IMPLANT
PIN STEINMANN THREADED TIP (PIN) ×2 IMPLANT
SET HNDPC FAN SPRY TIP SCT (DISPOSABLE) IMPLANT
SLING ARM IMMOBILIZER LRG (SOFTGOODS) IMPLANT
SLING ARM IMMOBILIZER MED (SOFTGOODS) IMPLANT
SLING SWATHE LARGE (SOFTGOODS) ×2 IMPLANT
SMARTMIX MINI TOWER (MISCELLANEOUS)
SPONGE LAP 18X18 X RAY DECT (DISPOSABLE) ×2 IMPLANT
SUCTION FRAZIER TIP 10 FR DISP (SUCTIONS) ×2 IMPLANT
SUPPORT WRAP ARM LG (MISCELLANEOUS) ×2 IMPLANT
SUT FIBERWIRE #2 38 REV NDL BL (SUTURE)
SUT MAXBRAID (SUTURE) ×10 IMPLANT
SUT MNCRL AB 4-0 PS2 18 (SUTURE) IMPLANT
SUT VIC AB 0 CT1 27 (SUTURE) ×1
SUT VIC AB 0 CT1 27XBRD ANBCTR (SUTURE) ×1 IMPLANT
SUT VIC AB 2-0 CT1 27 (SUTURE)
SUT VIC AB 2-0 CT1 TAPERPNT 27 (SUTURE) IMPLANT
SUT VIC AB 3-0 SH 8-18 (SUTURE) ×2 IMPLANT
SUTURE FIBERWR#2 38 REV NDL BL (SUTURE) IMPLANT
SYR CONTROL 10ML LL (SYRINGE) IMPLANT
TOWEL OR 17X24 6PK STRL BLUE (TOWEL DISPOSABLE) ×2 IMPLANT
TOWEL OR 17X26 10 PK STRL BLUE (TOWEL DISPOSABLE) ×2 IMPLANT
TOWER SMARTMIX MINI (MISCELLANEOUS) IMPLANT
WATER STERILE IRR 1000ML POUR (IV SOLUTION) ×2 IMPLANT

## 2014-12-16 NOTE — Op Note (Signed)
12/16/2014  9:49 AM  PATIENT:  Jay Bradshaw    PRE-OPERATIVE DIAGNOSIS:  Primary localized osteoarthritis RIGHT SHOULDER  POST-OPERATIVE DIAGNOSIS:  Same  PROCEDURE:  RIGHT SHOULDER HEMI-ARTHROPLASTY  SURGEON:  Johnny Bridge, MD  PHYSICIAN ASSISTANT: Joya Gaskins, OPA-C, present and scrubbed throughout the case, critical for completion in a timely fashion, and for retraction, instrumentation, and closure.  ANESTHESIA:   General  PREOPERATIVE INDICATIONS:  [name] is a  80 y.o. male with a diagnosis of OA RIGHT SHOULDER who failed conservative measures and elected for surgical management.    The risks benefits and alternatives were discussed with the patient preoperatively including but not limited to the risks of infection, bleeding, nerve injury, cardiopulmonary complications, the need for revision surgery, dislocation, loosening, incomplete relief of pain, among others, and the patient was willing to proceed.   OPERATIVE IMPLANTS: Biomet size 15 mini press-fit humeral stem, size 46x21 Versa-dial humeral head, set in the E position with increased coverage posteriorly.  OPERATIVE FINDINGS: Advanced glenohumeral osteoarthritis involving the glenoid and the humeral head with substantial osteophyte formation inferiorly.  The rotator cuff was still present including both supraspinatus and infraspinatus and subscapularis.  The osteophyte formation was fairly circumferential. This is on both the glenoid and the humeral side. There was such severe wear on the glenoid that the bone loss was approximately to the level of the coracoid, and was not amenable to glenoid implantation due to loss of bone.   OPERATIVE PROCEDURE: The patient was brought to the operating room and placed in the supine position. General anesthesia was administered. IV antibiotics were given.  The upper extremity was prepped and draped in usual sterile fashion. The patient was in a beachchair position with all bony  prominences padded.   Time out was performed and a deltopectoral approach was carried out. The biceps tendon was tenodesed to the pectoralis tendon. The subscapularis was released, tagging it with a #2 MaxBraid, leaving a cuff of tendon for repair.   The inferior osteophyte was removed, which was extremely substantial, requiring an osteotome around the inferior neck, and release of the capsule off of the humeral side was completed. The head was dislocated, and I reamed sequentially. I placed the humeral cutting guide at 30 of retroversion, and then pinned this into place, and made my humeral neck cut. This was at the appropriate level.   I then placed deep retractors and exposed the glenoid. I removed the tag of the biceps at the superior labrum, and also removed the inferior glenoid osteophytes. I evaluated the integrity of the glenoid, and there was severe posterior wear, as well as medialization and loss of bone stock, such that I did not feel confident that I would have adequate fixation to support a glenoid implant. Therefore I elected to proceed with hemiarthroplasty.  I sequentially broached, up to the selected size, with the broach set at 30 of retroversion. I then placed the real stem. I trialed with multiple heads, and the above-named component was selected. Increased posterior coverage improved the coverage. The soft tissue tension was appropriate.   I then impacted the real humeral head into place, reduced the head, and irrigated copiously. Excellent stability and range of motion was achieved. I repaired the subscapularis with 4 #2 MaxBraid, as well as the rotator interval, and irrigated copiously once more. The subcutaneous tissue was closed with Vicryl including the deltopectoral fascia.   The skin was closed with Steri-Strips and sterile gauze was applied. He had a preoperative nerve  block. He tolerated the procedure well and there were no complications.

## 2014-12-16 NOTE — Transfer of Care (Signed)
Immediate Anesthesia Transfer of Care Note  Patient: Jay Bradshaw  Procedure(s) Performed: Procedure(s): RIGHT TOTAL SHOULDER ARTHROPLASTY (Right) SHOULDER HEMI-ARTHROPLASTY (Right)  Patient Location: PACU  Anesthesia Type:General and Regional  Level of Consciousness: awake, alert , oriented and patient cooperative  Airway & Oxygen Therapy: Patient Spontanous Breathing and Patient connected to nasal cannula oxygen  Post-op Assessment: Report given to PACU RN and Post -op Vital signs reviewed and stable  Post vital signs: Reviewed and stable  Complications: No apparent anesthesia complications

## 2014-12-16 NOTE — H&P (Addendum)
PREOPERATIVE H&P  Chief Complaint: OA RIGHT SHOULDER  HPI: Jay Bradshaw is a 80 y.o. male who presents for preoperative history and physical with a diagnosis of OA RIGHT SHOULDER. Symptoms are rated as moderate to severe, and have been worsening.  This is significantly impairing activities of daily living.  He has elected for surgical management. He has failed injections, activity modification, anti-inflammatories.  Preoperative X-rays demonstrate end stage degenerative changes with osteophyte formation, loss of joint space, subchondral sclerosis.   Past Medical History  Diagnosis Date  . Hyperlipidemia   . Thyroid disease     had tumer on thyroid, but removed  . Coronary artery disease     Lft Arterial Blockage was at 50% about 12 years ago  . Hypothyroidism   . Arthritis   . COPD (chronic obstructive pulmonary disease)   . Shortness of breath dyspnea   . Tremors of nervous system    Past Surgical History  Procedure Laterality Date  . Cataract extraction  Aug and Sep 2011    first left eye, then right eye  . Colonoscopy  June 2008    Dr. Lurena Joiner ref by [facility]  . Irrigation and debridement sebaceous cyst  2010    from the back, [employer]  . Prostate biopsy  2006    Dr. Nadara Mustard [place]  . Lipoma excision  Nov 2004    from neck, right side (near location of mandible cyst), Dr Lorrine Kin ref by [facility]  . Excision of fibroma  October 1994    in skin on back of neck right of midline, Dede Query ref by Lidia Collum  . Vacuolar cyst removal      in right mandible fixed/filled, Denyce Robert ref by Synetta Shadow, DDS  . Thyroid adenoma removal  August 1970    right side, [facility]  . Hernia repair     History   Social History  . Marital Status: Married    Spouse Name: N/A    Number of Children: N/A  . Years of Education: N/A   Social History Main Topics  . Smoking status: Former Research scientist (life sciences)  . Smokeless tobacco: None     Comment: quit 1977  . Alcohol  Use: Yes     Comment: weekly  . Drug Use: No  . Sexual Activity: None   Other Topics Concern  . None   Social History Narrative   Family History  Problem Relation Age of Onset  . Hypertension Mother    No Known Allergies Prior to Admission medications   Medication Sig Start Date End Date Taking? Authorizing Provider  acetaminophen (TYLENOL) 500 MG tablet Take 500 mg by mouth at bedtime as needed.   Yes Historical Provider, MD  alendronate (FOSAMAX) 70 MG tablet Take 70 mg by mouth once a week. Take with a full glass of water on an empty stomach.   Yes Historical Provider, MD  aspirin 81 MG tablet Take 81 mg by mouth daily.   Yes Historical Provider, MD  CALCIUM-MAGNESIUM PO Take 500-600 mg by mouth 2 (two) times daily.   Yes Historical Provider, MD  Cholecalciferol (VITAMIN D-3 PO) Take 1,000 Units by mouth 2 (two) times daily.   Yes Historical Provider, MD  Coenzyme Q10 (CO Q10) 100 MG CAPS Take 200 mg by mouth daily.   Yes Historical Provider, MD  ibuprofen (ADVIL,MOTRIN) 200 MG tablet Take 200 mg by mouth at bedtime as needed.   Yes Historical Provider, MD  levothyroxine (SYNTHROID, LEVOTHROID) 88 MCG tablet  Take 88 mcg by mouth daily before breakfast.   Yes Historical Provider, MD  lovastatin (MEVACOR) 20 MG tablet Take 20 mg by mouth at bedtime.   Yes Historical Provider, MD  LUTEIN PO Take 200 mg by mouth every other day.    Yes Historical Provider, MD  Melatonin 3 MG TABS Take 1 tablet by mouth at bedtime as needed (sleep).   Yes Historical Provider, MD  metoprolol succinate (TOPROL-XL) 50 MG 24 hr tablet Take 50 mg by mouth daily. Take with or immediately following a meal.   Yes Historical Provider, MD  Multiple Vitamin (MULTIVITAMIN) capsule Take 1 capsule by mouth every other day.    Yes Historical Provider, MD  naproxen sodium (ANAPROX) 220 MG tablet Take 220 mg by mouth at bedtime as needed (pain).   Yes Historical Provider, MD  oxybutynin (DITROPAN) 5 MG tablet Take 2.5 mg  by mouth 2 (two) times daily.   Yes Historical Provider, MD  PSYLLIUM HUSK PO Take by mouth. 3-6 per day   Yes Historical Provider, MD  saw palmetto 160 MG capsule Take 160 mg by mouth 3 (three) times daily.   Yes Historical Provider, MD  tamsulosin (FLOMAX) 0.4 MG CAPS capsule Take 0.4 mg by mouth daily.   Yes Historical Provider, MD  vitamin C (ASCORBIC ACID) 500 MG tablet Take 500 mg by mouth every other day.    Yes Historical Provider, MD  vitamin E (VITAMIN E) 400 UNIT capsule Take 300 Units by mouth every other day.    Yes Historical Provider, MD  nitroGLYCERIN (NITROSTAT) 0.4 MG SL tablet Place 1 tablet (0.4 mg total) under the tongue every 5 (five) minutes as needed for chest pain. 01/29/14   Josue Hector, MD     Positive ROS: All other systems have been reviewed and were otherwise negative with the exception of those mentioned in the HPI and as above.  Physical Exam: General: Alert, no acute distress Cardiovascular: No pedal edema Respiratory: No cyanosis, no use of accessory musculature GI: No organomegaly, abdomen is soft and non-tender Skin: No lesions in the area of chief complaint Neurologic: Sensation intact distally Psychiatric: Patient is competent for consent with normal mood and affect Lymphatic: No axillary or cervical lymphadenopathy  MUSCULOSKELETAL: right shoulder active rom 0-90 with crepitance and weakness  Assessment: OA RIGHT SHOULDER, possible involvement of the rotator cuff  Plan: Plan for Procedure(s): RIGHT TOTAL SHOULDER ARTHROPLASTY, question reverse vs. Hemi, may not be enough bone stock for glenoid component.  The risks benefits and alternatives were discussed with the patient including but not limited to the risks of nonoperative treatment, versus surgical intervention including infection, bleeding, nerve injury,  blood clots, cardiopulmonary complications, morbidity, mortality, among others, and they were willing to proceed.   Johnny Bridge,  MD Cell [phone]   12/16/2014 6:06 AM

## 2014-12-16 NOTE — Progress Notes (Signed)
Utilization Review Completed.Teana Lindahl T1/19/2016  

## 2014-12-16 NOTE — Plan of Care (Signed)
Problem: Consults Goal: Diagnosis - Shoulder Surgery Total Shoulder Arthroplasty: Right     

## 2014-12-16 NOTE — Anesthesia Procedure Notes (Signed)
Anesthesia Regional Block:  Interscalene brachial plexus block  Pre-Anesthetic Checklist: ,, timeout performed, Correct Patient, Correct Site, Correct Laterality, Correct Procedure, Correct Position, site marked, Risks and benefits discussed,  Surgical consent,  Pre-op evaluation,  At surgeon's request and post-op pain management  Laterality: Right  Prep: Maximum Sterile Barrier Precautions used, chloraprep and alcohol swabs       Needles:  Injection technique: Single-shot  Needle Type: Stimulator Needle - 40        Needle insertion depth: 4 cm   Additional Needles:  Procedures: nerve stimulator Interscalene brachial plexus block  Nerve Stimulator or Paresthesia:  Response: 0.5 mA, 0.1 ms, 4 cm  Additional Responses:   Narrative:  Start time: 12/16/2014 7:20 AM End time: 12/16/2014 7:25 AM Injection made incrementally with aspirations every 5 mL.  Performed by: Personally   Additional Notes: Pt accepts procedure w/ risks. 20cc 0.5% Marcaine w/ epi w/o discomfort or difficulty. GES

## 2014-12-16 NOTE — Discharge Instructions (Signed)
Diet: As you were doing prior to hospitalization   Shower:  May shower but keep the wounds dry, use an occlusive plastic wrap, NO SOAKING IN TUB.  If the bandage gets wet, change with a clean dry gauze.  Dressing:  You may change your dressing 3-5 days after surgery.  Then change the dressing daily with sterile gauze dressing.    There are sticky tapes (steri-strips) on your wounds and all the stitches are absorbable.  Leave the steri-strips in place when changing your dressings, they will peel off with time, usually 2-3 weeks.  Activity:  Increase activity slowly as tolerated, but follow the weight bearing instructions below.  No lifting or driving for 6 weeks.  Weight Bearing:   Sling at all times, no lifting with right arm..    To prevent constipation: you may use a stool softener such as -  Colace (over the counter) 100 mg by mouth twice a day  Drink plenty of fluids (prune juice may be helpful) and high fiber foods Miralax (over the counter) for constipation as needed.    Itching:  If you experience itching with your medications, try taking only a single pain pill, or even half a pain pill at a time.  You may take up to 10 pain pills per day, and you can also use benadryl over the counter for itching or also to help with sleep.   Precautions:  If you experience chest pain or shortness of breath - call 911 immediately for transfer to the hospital emergency department!!  If you develop a fever greater that 101 F, purulent drainage from wound, increased redness or drainage from wound, or calf pain -- Call the office at [phone]                                                Follow- Up Appointment:  Please call for an appointment to be seen in 2 weeks [place] - [phone]

## 2014-12-16 NOTE — Progress Notes (Signed)
Patient extremely uncomfortable and unable to void since arrival from PACU. Bladder scan revealed 986ml of urine in bladder. Per protocol in and out cath performed. Output of 948ml. Patient immediately relieved. Will continue to monitor for spontaneous void.

## 2014-12-16 NOTE — Anesthesia Preprocedure Evaluation (Addendum)
Anesthesia Evaluation  Patient identified by MRN, date of birth, ID band Patient awake    Reviewed: Allergy & Precautions, NPO status , Patient's Chart, lab work & pertinent test results, reviewed documented beta blocker date and time   History of Anesthesia Complications Negative for: history of anesthetic complications  Airway Mallampati: II  TM Distance: >3 FB Neck ROM: Full    Dental  (+) Teeth Intact, Dental Advisory Given   Pulmonary shortness of breath and with exertion, COPDformer smoker,  breath sounds clear to auscultation        Cardiovascular hypertension, Pt. on medications and Pt. on home beta blockers + CAD Rhythm:Regular Rate:Normal     Neuro/Psych negative neurological ROS  negative psych ROS   GI/Hepatic negative GI ROS, Neg liver ROS,   Endo/Other  Hypothyroidism   Renal/GU negative Renal ROS     Musculoskeletal  (+) Arthritis -, Osteoarthritis,    Abdominal   Peds  Hematology negative hematology ROS (+)   Anesthesia Other Findings Pt says crown feels loose on top left. Many caps and crowns.tb  Reproductive/Obstetrics negative OB ROS                         Anesthesia Physical Anesthesia Plan  ASA: III  Anesthesia Plan: General and Regional   Post-op Pain Management:    Induction: Intravenous  Airway Management Planned: Oral ETT  Additional Equipment:   Intra-op Plan:   Post-operative Plan: Extubation in OR  Informed Consent: I have reviewed the patients History and Physical, chart, labs and discussed the procedure including the risks, benefits and alternatives for the proposed anesthesia with the patient or authorized representative who has indicated his/her understanding and acceptance.   Dental advisory given  Plan Discussed with: CRNA and Anesthesiologist  Anesthesia Plan Comments:        Anesthesia Quick Evaluation

## 2014-12-16 NOTE — Progress Notes (Signed)
OT Cancellation Note  Patient Details Name: Jay Bradshaw MRN: [id number] DOB: 30-Apr-1935   Cancelled Treatment:    Reason Eval/Treat Not Completed: Other (comment) (pt has been nauseous. Will wait until tomorrow to evaluate.)  Benito Mccreedy OTR/L [phone] 12/16/2014, 3:53 PM

## 2014-12-17 ENCOUNTER — Encounter (HOSPITAL_COMMUNITY): Payer: Self-pay | Admitting: Orthopedic Surgery

## 2014-12-17 LAB — BASIC METABOLIC PANEL
Anion gap: 13 (ref 5–15)
BUN: 17 mg/dL (ref 6–23)
CHLORIDE: 104 meq/L (ref 96–112)
CO2: 19 mmol/L (ref 19–32)
Calcium: 8.3 mg/dL — ABNORMAL LOW (ref 8.4–10.5)
Creatinine, Ser: 0.85 mg/dL (ref 0.50–1.35)
GFR calc Af Amer: >90 mL/min (ref >90)
GFR calc non Af Amer: 82 mL/min — ABNORMAL LOW (ref >90)
Glucose, Bld: 117 mg/dL — ABNORMAL HIGH (ref 70–99)
Potassium: 4.4 mmol/L (ref 3.5–5.1)
Sodium: 136 mmol/L (ref 135–145)

## 2014-12-17 LAB — CBC
HEMATOCRIT: 34.1 % — AB (ref 39.0–52.0)
HEMOGLOBIN: 11.3 g/dL — AB (ref 13.0–17.0)
MCH: 29.4 pg (ref 26.0–34.0)
MCHC: 33.1 g/dL (ref 30.0–36.0)
MCV: 88.6 fL (ref 78.0–100.0)
Platelets: 202 10*3/uL (ref 150–400)
RBC: 3.85 MIL/uL — ABNORMAL LOW (ref 4.22–5.81)
RDW: 13.6 % (ref 11.5–15.5)
WBC: 8.6 10*3/uL (ref 4.0–10.5)

## 2014-12-17 NOTE — Progress Notes (Signed)
PT Cancellation Note  Patient Details Name: Jay Bradshaw MRN: [id number] DOB: [birth year]   Cancelled Treatment:    Reason Eval/Treat Not Completed: PT screened, no needs identified, will sign off. Spoke with OT, pt mobilizing without (A) at this time. Will sign off. Thanks.    Elie Confer [place], [place] 12/17/2014, 11:41 AM

## 2014-12-17 NOTE — Anesthesia Postprocedure Evaluation (Signed)
  Anesthesia Post-op Note  Patient: Jay Bradshaw  Procedure(s) Performed: Procedure(s): RIGHT TOTAL SHOULDER ARTHROPLASTY (Right) SHOULDER HEMI-ARTHROPLASTY (Right)  Patient Location: PACU  Anesthesia Type:General  Level of Consciousness: awake  Airway and Oxygen Therapy: Patient Spontanous Breathing  Post-op Pain: mild  Post-op Assessment: Post-op Vital signs reviewed  Post-op Vital Signs: Reviewed  Last Vitals:  Filed Vitals:   12/17/14 1400  BP: 117/46  Pulse: 67  Temp: 36.5 C  Resp: 18    Complications: No apparent anesthesia complications

## 2014-12-17 NOTE — Progress Notes (Signed)
Pt was DTV at 12 am 1/19. Attempted to void in bathroom multiple times, PO fluids were encouraged, receiving IVF @75 . Was unable to void. Bladder scan @ 1:45 showed 749 ccs. In and out cath performed @ 2 am with 950 ccs output. Will continue to monitor.

## 2014-12-17 NOTE — Progress Notes (Signed)
     Subjective:  Patient reports pain as mild.  Difficulty voiding, had to be i/o cath x2.    Objective:   VITALS:   Filed Vitals:   12/16/14 1049 12/16/14 2200 12/17/14 0157 12/17/14 0600  BP: 149/66 127/63 137/54 116/53  Pulse: 78 70 66 66  Temp: 98.1 F (36.7 C) 98.4 F (36.9 C) 98.2 F (36.8 C) 98.3 F (36.8 C)  TempSrc:      Resp: 15 16 16 16   Height:      Weight:      SpO2: 93% 100% 98% 98%    Neurologically intact Dorsiflexion/Plantar flexion intact Incision: dressing C/D/I and no drainage   Lab Results  Component Value Date   WBC 8.6 12/17/2014   HGB 11.3* 12/17/2014   HCT 34.1* 12/17/2014   MCV 88.6 12/17/2014   PLT 202 12/17/2014   BMET    Component Value Date/Time   NA 140 12/03/2014 1117   K 4.6 12/03/2014 1117   CL 106 12/03/2014 1117   CO2 29 12/03/2014 1117   GLUCOSE 89 12/03/2014 1117   BUN 25* 12/03/2014 1117   CREATININE 0.98 12/03/2014 1117   CALCIUM 9.2 12/03/2014 1117   GFRNONAA 77* 12/03/2014 1117   GFRAA 90* 12/03/2014 1117     Assessment/Plan: 1 Day Post-Op   Principal Problem:   Osteoarthritis of right shoulder, primary Active Problems:   S/P shoulder replacement post op urinary retention  Plan for discharge tomorrow Need to make sure he can void.  May need foley if not.  C/w flomax.   Michae Grimley P 12/17/2014, 8:33 AM   Marchia Bond, MD Cell [phone]

## 2014-12-17 NOTE — Progress Notes (Signed)
Occupational Therapy Evaluation Patient Details Name: Kostantinos Tallman MRN: [id number] DOB: 1935-01-12 Today's Date: 12/17/2014    History of Present Illness s/p R TSA   Clinical Impression   PTA, pt independent with ADL and mobility. Began educating pt on compensatory techniques for ADL and management of RUE s/p TSA. Pt given handout. Will return tomorrow @ 11:00 per pt in order to complete education with pt's wife to facilitate D/C home with 24/7 S. Pt will follow up with Dr. Mardelle Matte for further therapy needs.     Follow Up Recommendations  Other (comment);Supervision/Assistance - 24 hour    Equipment Recommendations  None recommended by OT    Recommendations for Other Services       Precautions / Restrictions Precautions Precautions: Shoulder Type of Shoulder Precautions: no shoulder movement. elbow/wrist/hand AROM only Precaution Booklet Issued: Yes (comment) Required Braces or Orthoses: Sling Restrictions Weight Bearing Restrictions: Yes RUE Weight Bearing: Non weight bearing      Mobility Bed Mobility Overal bed mobility: Modified Independent                Transfers Overall transfer level: Needs assistance   Transfers: Sit to/from Stand;Stand Pivot Transfers Sit to Stand: Supervision Stand pivot transfers: Min guard       General transfer comment: Pt states he feels unsteady due to pain meds    Balance Overall balance assessment: Needs assistance           Standing balance-Leahy Scale: Fair                              ADL Overall ADL's : Needs assistance/impaired Eating/Feeding: Set up   Grooming: Moderate assistance   Upper Body Bathing: Moderate assistance   Lower Body Bathing: Minimal assistance   Upper Body Dressing : Moderate assistance   Lower Body Dressing: Minimal assistance   Toilet Transfer: Min guard   Toileting- Clothing Manipulation and Hygiene: Min guard       Functional mobility during ADLs: Min guard  (due to pain meds) General ADL Comments: Began education on compensatory techniques. Need to educate wife tomorrow prior to D/C     Vision                     Perception     Praxis      Pertinent Vitals/Pain Pain Assessment: 0-10 Pain Score: 6  Pain Location: R shoulder Pain Descriptors / Indicators: Aching Pain Intervention(s): Limited activity within patient's tolerance;Monitored during session;Repositioned (declined ice)     Hand Dominance Right   Extremity/Trunk Assessment Upper Extremity Assessment Upper Extremity Assessment: RUE deficits/detail RUE Deficits / Details: elbow/wrist /hand ROM WFL RUE Coordination: decreased gross motor;decreased fine motor   Lower Extremity Assessment Lower Extremity Assessment: Overall WFL for tasks assessed   Cervical / Trunk Assessment Cervical / Trunk Assessment: Normal   Communication Communication Communication: No difficulties   Cognition Arousal/Alertness: Awake/alert Behavior During Therapy: WFL for tasks assessed/performed Overall Cognitive Status: Within Functional Limits for tasks assessed                     General Comments       Exercises Exercises: Shoulder     Shoulder Instructions Shoulder Instructions Donning/doffing shirt without moving shoulder: Moderate assistance Method for sponge bathing under operated UE: Moderate assistance Donning/doffing sling/immobilizer: Moderate assistance Correct positioning of sling/immobilizer: Moderate assistance ROM for elbow, wrist and digits of operated UE: Supervision/safety Sling  wearing schedule (on at all times/off for ADL's): Supervision/safety Proper positioning of operated UE when showering: Minimal assistance Positioning of UE while sleeping: Minimal assistance    Home Living Family/patient expects to be discharged to:: Private residence Living Arrangements: Spouse/significant other Available Help at Discharge: Family;Available 24  hours/day Type of Home: House Home Access: Stairs to enter     Home Layout: One level     Bathroom Shower/Tub: Tub/shower unit Shower/tub characteristics: Architectural technologist: Standard Bathroom Accessibility: Yes How Accessible: Accessible via walker Home Equipment: None          Prior Functioning/Environment Level of Independence: Independent             OT Diagnosis: Generalized weakness;Acute pain   OT Problem List: Decreased strength;Decreased range of motion;Decreased knowledge of use of DME or AE;Decreased knowledge of precautions;Pain;Impaired UE functional use   OT Treatment/Interventions: Self-care/ADL training;Therapeutic exercise;DME and/or AE instruction;Therapeutic activities;Patient/family education    OT Goals(Current goals can be found in the care plan section) Acute Rehab OT Goals Patient Stated Goal: to be able to use my arm again OT Goal Formulation: With patient Time For Goal Achievement: 12/24/14 Potential to Achieve Goals: Good  OT Frequency: Min 2X/week   Barriers to D/C:            Co-evaluation              End of Session Equipment Utilized During Treatment: Gait belt Nurse Communication: Mobility status  Activity Tolerance: Patient tolerated treatment well Patient left: in chair;with call bell/phone within reach   Time: 0850-0926 OT Time Calculation (min): 36 min Charges:  OT General Charges $OT Visit: 1 Procedure OT Evaluation $Initial OT Evaluation Tier I: 1 Procedure OT Treatments $Self Care/Home Management : 8-22 mins G-Codes:    Khrystian Schauf,HILLARY 2014/12/23, 9:50 AM   Maurie Boettcher, OTR/L  [phone] 2014-12-23

## 2014-12-17 NOTE — Progress Notes (Signed)
Pts bladder scanned at 11am after being I/O cathed at 2am. The scan showed 473 cc of urine. Pt refused to have a cath placed at this time and has continued to attempt to void through out the day as well as refuse a foley. At this time he finally agreed to have the foley placed. When I entered the room he was in the BR and has started to void a small amount. Pt now has asked for a little more time to try to void without having a foley placed. Will continue to monitor Pt and notify the oncoming RN of the Pts choice and refusal of having a foley placed.

## 2014-12-18 NOTE — Progress Notes (Signed)
Occupational Therapy Treatment Patient Details Name: Jay Bradshaw MRN: [id number] DOB: [birth year] Today's Date: 12/18/2014    History of present illness s/p R TSA   OT comments  Completed education with pt/wife regarding compensatory techniques for ADL and mobility adhering to Dr. Luanna Cole precautions of No shoulder movement. Written information given. Pt/wife able to return demonstrate. Pt appropriate for D/C home with 24/7 S and follow up with Dr. Mardelle Matte to advance exercises per his protocol.   Follow Up Recommendations  Other (comment);Supervision/Assistance - 24 hour (follow up per Dr. Luanna Cole instructions)    Equipment Recommendations  Tub/shower seat (pt to get on his own if desired)    Recommendations for Other Services      Precautions / Restrictions Precautions Precautions: Shoulder Type of Shoulder Precautions: no shoulder movement. elbow/wrist/hand AROM only Shoulder Interventions: Shoulder sling/immobilizer;At all times;Off for dressing/bathing/exercises Precaution Booklet Issued: Yes (comment) Precaution Comments: requires reminder to not actively use R shoulder Required Braces or Orthoses: Sling Restrictions RUE Weight Bearing: Non weight bearing       Mobility Bed Mobility                  Transfers Overall transfer level: Needs assistance   Transfers: Sit to/from Stand Sit to Stand: Supervision                                                 ADL                                       Functional mobility during ADLs: Supervision/safety General ADL Comments: Completed education with wife regarding ADL and management of R shoulder per precautions( no shoulder movement). REcommended for pt to use shower chair to reduce risk of falls. Wife verbalized understanding.                                      Cognition   Behavior During Therapy: WFL for tasks assessed/performed Overall Cognitive  Status: Within Functional Limits for tasks assessed                                      Exercises Shoulder Exercises Elbow Flexion: AROM;AAROM;Right;10 reps;Seated (also pronation/supination x 10 with elbow by side) Elbow Extension: AROM;AAROM;Right;10 reps;Seated Wrist Flexion: AROM;Right;10 reps;Seated Wrist Extension: AROM;Strengthening;10 reps;Seated Digit Composite Flexion: AROM;Right;10 reps Composite Extension: AROM;Right;10 reps Donning/doffing shirt without moving shoulder: Patient able to independently direct caregiver;Caregiver independent with task;Supervision/safety Method for sponge bathing under operated UE: Patient able to independently direct caregiver;Caregiver independent with task;Supervision/safety Donning/doffing sling/immobilizer: Caregiver independent with task;Patient able to independently direct caregiver;Supervision/safety Correct positioning of sling/immobilizer: Patient able to independently direct caregiver ROM for elbow, wrist and digits of operated UE: Independent Sling wearing schedule (on at all times/off for ADL's): Independent Proper positioning of operated UE when showering: Independent Positioning of UE while sleeping: Independent   Shoulder Instructions Shoulder Instructions Donning/doffing shirt without moving shoulder: Patient able to independently direct caregiver;Caregiver independent with task;Supervision/safety Method for sponge bathing under operated UE: Patient able to independently direct caregiver;Caregiver independent with task;Supervision/safety Donning/doffing sling/immobilizer: Caregiver independent with task;Patient able  to independently direct caregiver;Supervision/safety Correct positioning of sling/immobilizer: Patient able to independently direct caregiver ROM for elbow, wrist and digits of operated UE: Independent Sling wearing schedule (on at all times/off for ADL's): Independent Proper positioning of operated UE  when showering: Independent Positioning of UE while sleeping: Independent           Pertinent Vitals/ Pain       Pain Assessment: 0-10 Pain Score: 4  Pain Location: R shoulder Pain Descriptors / Indicators: Aching Pain Intervention(s): Limited activity within patient's tolerance;Monitored during session;Repositioned                                                          Frequency Min 2X/week     Progress Toward Goals  OT Goals(current goals can now be found in the care plan section)  Progress towards OT goals: Goals met/education completed, patient discharged from OT  Acute Rehab OT Goals Patient Stated Goal: to be able to use my arm again OT Goal Formulation: With patient Time For Goal Achievement: 12/24/14 Potential to Achieve Goals: Good ADL Goals Pt/caregiver will Perform Home Exercise Program: With written HEP provided;Independently (elbow flex/ext; sup/pro/wrist/hand AROM) Additional ADL Goal #1: Pt/caregiver verbalize understandingof management of RUE during ADL tasks Additional ADL Goal #2: pt/wife independent with sling management and adhering to NWB status during ADL and mobility  Plan Discharge plan remains appropriate    Co-evaluation                 End of Session     Activity Tolerance Patient tolerated treatment well   Patient Left in chair;with call bell/phone within reach;with family/visitor present   Nurse Communication Mobility status;Other (comment) (ready for D/C)        Time: 6282-4175 OT Time Calculation (min): 34 min  Charges: OT General Charges $OT Visit: 1 Procedure OT Treatments $Self Care/Home Management : 8-22 mins $Therapeutic Activity: 8-22 mins  Kendrick Haapala,H[place]ARY 12/18/2014, 12:06 PM   [facility], OTR/L  [phone] 12/18/2014

## 2014-12-18 NOTE — Progress Notes (Signed)
Patient ID: Jay Bradshaw, male   DOB: [birth year], 80 y.o.   MRN: [id number]     Subjective:  Patient reports pain as mild to moderate.  Patient reports improvement Denies any CP or SOB  Objective:   VITALS:   Filed Vitals:   12/17/14 0600 12/17/14 1400 12/17/14 2125 12/18/14 0600  BP: 116/53 117/46 140/57 137/54  Pulse: 66 67 61 70  Temp: 98.3 F (36.8 C) 97.7 F (36.5 C) 98.4 F (36.9 C) 98.4 F (36.9 C)  TempSrc:      Resp: 16 18 18 18   Height:      Weight:      SpO2: 98% 98% 99% 94%    ABD soft Sensation intact distally Dorsiflexion/Plantar flexion intact Incision: dressing C/D/I and no drainage Foley still in place Good wrist and hand function  Lab Results  Component Value Date   WBC 8.6 12/17/2014   HGB 11.3* 12/17/2014   HCT 34.1* 12/17/2014   MCV 88.6 12/17/2014   PLT 202 12/17/2014   BMET    Component Value Date/Time   NA 136 12/17/2014 0530   K 4.4 12/17/2014 0530   CL 104 12/17/2014 0530   CO2 19 12/17/2014 0530   GLUCOSE 117* 12/17/2014 0530   BUN 17 12/17/2014 0530   CREATININE 0.85 12/17/2014 0530   CALCIUM 8.3* 12/17/2014 0530   GFRNONAA 82* 12/17/2014 0530   GFRAA >90 12/17/2014 0530     Assessment/Plan: 2 Days Post-Op   Principal Problem:   Osteoarthritis of right shoulder, primary Active Problems:   S/P shoulder replacement   Advance diet Up with therapy Discharge home with home health Urinary retention post op: Keep foley until outpatient urology fu.  Has an appointment with urology next week Sling at all times nwb right upper ext   Remonia Richter 12/18/2014, 7:08 AM  Discussed and agree with above. Marchia Bond, MD Cell [phone]

## 2014-12-18 NOTE — Discharge Summary (Signed)
Physician Discharge Summary  Patient ID: Jay Bradshaw MRN: [id number] DOB/AGE: 78-Dec-1936 78 y.o.  Admit date: 12/16/2014 Discharge date: 12/18/2014  Admission Diagnoses:  Osteoarthritis of right shoulder  Discharge Diagnoses:  Principal Problem:   Osteoarthritis of right shoulder, primary Active Problems:   S/P shoulder replacement urinary retention requiring indwelling foley catheter at dc  Past Medical History  Diagnosis Date  . Hyperlipidemia   . Thyroid disease     had tumer on thyroid, but removed  . Coronary artery disease     Lft Arterial Blockage was at 50% about 12 years ago  . Hypothyroidism   . Arthritis   . COPD (chronic obstructive pulmonary disease)   . Shortness of breath dyspnea   . Tremors of nervous system   . Osteoarthritis of right shoulder, primary 12/16/2014  . Complication of anesthesia   . PONV (postoperative nausea and vomiting)     Surgeries: Procedure(s): RIGHT TOTAL SHOULDER ARTHROPLASTY SHOULDER HEMI-ARTHROPLASTY on 12/16/2014   Consultants (if any):    Discharged Condition: Improved  Hospital Course: Jay Bradshaw is an 80 y.o. male who was admitted 12/16/2014 with a diagnosis of Osteoarthritis of right shoulder and went to the operating room on 12/16/2014 and underwent the above named procedures.    He was given perioperative antibiotics:  Anti-infectives    Start     Dose/Rate Route Frequency Ordered Stop   12/16/14 1400  ceFAZolin (ANCEF) IVPB 1 g/50 mL premix     1 g100 mL/hr over 30 Minutes Intravenous Every 6 hours 12/16/14 1048 12/17/14 0144   12/16/14 0600  ceFAZolin (ANCEF) IVPB 2 g/50 mL premix     2 g100 mL/hr over 30 Minutes Intravenous On call to O.R. 12/15/14 1355 12/16/14 0751    .  He was given sequential compression devices, early ambulation for DVT prophylaxis.  He was unable to urinate independently after multiple i/o caths, and required indwelling cath and fu with urologist.  He benefited maximally from the  hospital stay and there were no complications.    Recent vital signs:  Filed Vitals:   12/18/14 0600  BP: 137/54  Pulse: 70  Temp: 98.4 F (36.9 C)  Resp: 18    Recent laboratory studies:  Lab Results  Component Value Date   HGB 11.3* 12/17/2014   HGB 13.9 12/03/2014   Lab Results  Component Value Date   WBC 8.6 12/17/2014   PLT 202 12/17/2014   No results found for: INR Lab Results  Component Value Date   NA 136 12/17/2014   K 4.4 12/17/2014   CL 104 12/17/2014   CO2 19 12/17/2014   BUN 17 12/17/2014   CREATININE 0.85 12/17/2014   GLUCOSE 117* 12/17/2014    Discharge Medications:     Medication List    STOP taking these medications        acetaminophen 500 MG tablet  Commonly known as:  TYLENOL     ibuprofen 200 MG tablet  Commonly known as:  ADVIL,MOTRIN     naproxen sodium 220 MG tablet  Commonly known as:  ANAPROX      TAKE these medications        alendronate 70 MG tablet  Commonly known as:  FOSAMAX  Take 70 mg by mouth once a week. Take with a full glass of water on an empty stomach.     aspirin 81 MG tablet  Take 81 mg by mouth daily.     baclofen 10 MG tablet  Commonly known as:  LIORESAL  Take 1 tablet (10 mg total) by mouth 3 (three) times daily. As needed for muscle spasm     CALCIUM-MAGNESIUM PO  Take 500-600 mg by mouth 2 (two) times daily.     Co Q10 100 MG Caps  Take 200 mg by mouth daily.     levothyroxine 88 MCG tablet  Commonly known as:  SYNTHROID, LEVOTHROID  Take 88 mcg by mouth daily before breakfast.     lovastatin 20 MG tablet  Commonly known as:  MEVACOR  Take 20 mg by mouth at bedtime.     LUTEIN PO  Take 200 mg by mouth every other day.     Melatonin 3 MG Tabs  Take 1 tablet by mouth at bedtime as needed (sleep).     metoprolol succinate 50 MG 24 hr tablet  Commonly known as:  TOPROL-XL  Take 50 mg by mouth daily. Take with or immediately following a meal.     multivitamin capsule  Take 1 capsule by  mouth every other day.     nitroGLYCERIN 0.4 MG SL tablet  Commonly known as:  NITROSTAT  Place 1 tablet (0.4 mg total) under the tongue every 5 (five) minutes as needed for chest pain.     ondansetron 4 MG tablet  Commonly known as:  ZOFRAN  Take 1 tablet (4 mg total) by mouth every 8 (eight) hours as needed for nausea or vomiting.     oxybutynin 5 MG tablet  Commonly known as:  DITROPAN  Take 2.5 mg by mouth 2 (two) times daily.     oxyCODONE-acetaminophen 10-325 MG per tablet  Commonly known as:  PERCOCET  Take 1-2 tablets by mouth every 6 (six) hours as needed for pain. MAXIMUM TOTAL ACETAMINOPHEN DOSE IS 4000 MG PER DAY     PSYLLIUM HUSK PO  Take by mouth. 3-6 per day     saw palmetto 160 MG capsule  Take 160 mg by mouth 3 (three) times daily.     sennosides-docusate sodium 8.6-50 MG tablet  Commonly known as:  SENOKOT-S  Take 2 tablets by mouth daily.     tamsulosin 0.4 MG Caps capsule  Commonly known as:  FLOMAX  Take 0.4 mg by mouth daily.     vitamin C 500 MG tablet  Commonly known as:  ASCORBIC ACID  Take 500 mg by mouth every other day.     VITAMIN D-3 PO  Take 1,000 Units by mouth 2 (two) times daily.     vitamin E 400 UNIT capsule  Generic drug:  vitamin E  Take 300 Units by mouth every other day.        Diagnostic Studies: Dg Shoulder Right  12/16/2014   CLINICAL DATA:  Right shoulder replacement surgery  EXAM: RIGHT SHOULDER - 2+ VIEW  COMPARISON:  CT 09/03/2014  FINDINGS: Single portable image shows placement of a right humeral head prosthesis projecting in expected location. Regional subcutaneous gas is noted. No evidence of fracture or dislocation on this single projection. The glenoid is not profiled.  IMPRESSION: Right shoulder arthroplasty   Electronically Signed   By: Arne Cleveland M.D.   On: 12/16/2014 10:55    Disposition: home        Follow-up Information    Follow up with Johnny Bridge, MD. Schedule an appointment as soon as  possible for a visit in 2 weeks.   Specialty:  Orthopedic Surgery   Contact information:   [place]  [place] [phone]        Signed: Johnny Bridge 12/18/2014, 11:36 AM

## 2014-12-18 NOTE — Progress Notes (Signed)
D/C instructions and scripts given to Pt. Pt verbalized understanding of home care and felt comfortable with his discharge. Removed IV and gave Pt a leg bag for his cath. A home health RN was set up to assist Pt with cath care at home. Pts wife is at the bedside and his daughter is on the way to take the Pt home.

## 2014-12-18 NOTE — Progress Notes (Signed)
CARE MANAGEMENT NOTE 12/18/2014  Patient:  Jay Bradshaw, Jay Bradshaw   Account Number:  [id number]  Date Initiated:  12/18/2014  Documentation initiated by:  [facility]  Subjective/Objective Assessment:   s/p rt TSA     Action/Plan:   OT eval- no follow up recommended  [facility] for follow up with foley   Anticipated DC Date:  12/18/2014   Anticipated DC Plan:  [place]  CM consult      [facility] Choice  HOME HEALTH   Choice offered to / List presented to:  C-1 Patient        [place] arranged  HH-1 RN      [place].   Status of service:  Completed, signed off Medicare Important Message given?   (If response is "NO", the following Medicare IM given date fields will be blank) Date Medicare IM given:   Medicare IM given by:   Date Additional Medicare IM given:   Additional Medicare IM given by:    Discharge Disposition:  [place]  Per UR Regulation:  Reviewed for med. necessity/level of care/duration of stay  If discussed at [place] of Stay Meetings, dates discussed:    Comments:  12/18/14 Spoke with patient about HHC, he selected [facility]. Contacted [facility], they do not work with [employer]. Patient selected [facility].Contacted Miranda at  [facility] and set up [facility] for foley care.

## 2015-09-08 DIAGNOSIS — E785 Hyperlipidemia, unspecified: Secondary | ICD-10-CM | POA: Diagnosis not present

## 2015-09-08 DIAGNOSIS — I2510 Atherosclerotic heart disease of native coronary artery without angina pectoris: Secondary | ICD-10-CM | POA: Diagnosis not present

## 2015-09-08 DIAGNOSIS — Z23 Encounter for immunization: Secondary | ICD-10-CM | POA: Diagnosis not present

## 2015-09-15 DIAGNOSIS — E890 Postprocedural hypothyroidism: Secondary | ICD-10-CM | POA: Diagnosis not present

## 2015-09-15 DIAGNOSIS — Z0000 Encounter for general adult medical examination without abnormal findings: Secondary | ICD-10-CM | POA: Diagnosis not present

## 2015-09-15 DIAGNOSIS — R109 Unspecified abdominal pain: Secondary | ICD-10-CM | POA: Diagnosis not present

## 2015-09-15 DIAGNOSIS — E785 Hyperlipidemia, unspecified: Secondary | ICD-10-CM | POA: Diagnosis not present

## 2015-09-15 DIAGNOSIS — R69 Illness, unspecified: Secondary | ICD-10-CM | POA: Diagnosis not present

## 2015-09-15 DIAGNOSIS — M25512 Pain in left shoulder: Secondary | ICD-10-CM | POA: Diagnosis not present

## 2015-09-15 DIAGNOSIS — M7030 Other bursitis of elbow, unspecified elbow: Secondary | ICD-10-CM | POA: Diagnosis not present

## 2015-09-15 DIAGNOSIS — Z6824 Body mass index (BMI) 24.0-24.9, adult: Secondary | ICD-10-CM | POA: Diagnosis not present

## 2015-09-15 DIAGNOSIS — I2510 Atherosclerotic heart disease of native coronary artery without angina pectoris: Secondary | ICD-10-CM | POA: Diagnosis not present

## 2015-09-15 DIAGNOSIS — N401 Enlarged prostate with lower urinary tract symptoms: Secondary | ICD-10-CM | POA: Diagnosis not present

## 2015-09-15 NOTE — Progress Notes (Signed)
Patient ID: Jay Bradshaw, male   DOB: [birth year], 80 y.o.   MRN: [id number] 79 y.o.  moving from [place] Wanted to "establish" with heart doctor. Describes having cath about 8 years ago No intervention done ? Moderate disease. No repeat cath since that time. 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 Has been on beta blocker for HTN and since heart cath. Takes ASA No dyspnea, palpitations or syncope Compliant with meds. Does not smoke or drink No chest pain    2/15 TC 147  LDL 87  Normal LFTls   Needs shoulder surgery with Dr Mardelle Matte Right shoulder done January without issue.  But has had increasing chest pains since February.  Not always exertional Central pressure.  Wife was hit by a run away car at rest stop in [place] and he has had to care for her Stress of this seems to have ppt more pains  Last myovue in [place] reviewed normal no ischemia EF 70%   ROS: Denies fever, malais, weight loss, blurry vision, decreased visual acuity, cough, sputum, SOB, hemoptysis, pleuritic pain, palpitaitons, heartburn, abdominal pain, melena, lower extremity edema, claudication, or rash.  All other systems reviewed and negative  General: Affect appropriate Healthy:  appears stated age 48: normal Neck supple with no adenopathy JVP normal no bruits no thyromegaly Lungs clear with no wheezing and good diaphragmatic motion Heart:  S1/S2 no murmur, no rub, gallop or click PMI normal Abdomen: benighn, BS positve, no tenderness, no AAA no bruit.  No HSM or HJR Distal pulses intact with no bruits No edema Neuro non-focal UE tremor  Skin warm and dry Decreased ROM UE;s right greater than left    Current Outpatient Prescriptions  Medication Sig Dispense Refill  . alendronate (FOSAMAX) 70 MG tablet Take 70 mg by mouth once a week. Take with a full glass of water on an  empty stomach.    Marland Kitchen aspirin 81 MG tablet Take 81 mg by mouth daily.    Marland Kitchen CALCIUM-MAGNESIUM PO Take 500-600 mg by mouth 2 (two) times daily.    . cholecalciferol (VITAMIN D) 400 UNITS TABS tablet Take 400 Units by mouth 2 (two) times daily.    . Coenzyme Q10 (CO Q10) 100 MG CAPS Take 200 mg by mouth daily.    Marland Kitchen docusate sodium (COLACE) 100 MG capsule Take 100 mg by mouth daily as needed for mild constipation.    Marland Kitchen levothyroxine (SYNTHROID, LEVOTHROID) 88 MCG tablet Take 88 mcg by mouth daily before breakfast.    . lovastatin (MEVACOR) 20 MG tablet Take 20 mg by mouth at bedtime.    . Melatonin 3 MG TABS Take 1 tablet by mouth at bedtime as needed (sleep).    . metoprolol succinate (TOPROL-XL) 50 MG 24 hr tablet Take 50 mg by mouth daily. Take with or immediately following a meal.    . Multiple Vitamin (MULTIVITAMIN) capsule Take 1 capsule by mouth every other day.     . Multiple Vitamins-Minerals (PRESERVISION AREDS 2) CAPS Take 1 capsule by mouth every other day.    . nitroGLYCERIN (NITROSTAT) 0.4 MG SL tablet Place 0.4 mg under the tongue every 5 (five) minutes as needed for chest pain (up to 3 doses MAX).    Marland Kitchen oxybutynin (DITROPAN) 5 MG tablet Take 1.25 mg by mouth 2 (two) times daily.     Marland Kitchen  PSYLLIUM HUSK PO Take 1 capsule by mouth 3 (three) times daily as needed (depending on meals he ate that day).     . saw palmetto 160 MG capsule Take 160 mg by mouth 3 (three) times daily.    . tamsulosin (FLOMAX) 0.4 MG CAPS capsule Take 0.4 mg by mouth daily.    . vitamin C (ASCORBIC ACID) 500 MG tablet Take 500 mg by mouth every other day.     . vitamin E (VITAMIN E) 400 UNIT capsule Take 300 Units by mouth every other day.      No current facility-administered medications for this visit.    Allergies  Review of patient's allergies indicates no known allergies.  Electrocardiogram:  NSR normal ECG 2014  NSR rate 58 normal no change   Assessment and Plan CAD: Moderate non ischemic myovue 2012  Preop shoulder surgery F/U exercise myovue in light of more frequent chest pains Tremor: seems better continue beta blocker  Thyroid:  On replacement TSH normal Ortho:  Has had good result with right shoulder surgery ok for left if myovue normal  [facility] [place]

## 2015-09-16 ENCOUNTER — Other Ambulatory Visit: Payer: Self-pay | Admitting: Orthopedic Surgery

## 2015-09-17 ENCOUNTER — Ambulatory Visit (INDEPENDENT_AMBULATORY_CARE_PROVIDER_SITE_OTHER): Payer: Medicare HMO | Admitting: Cardiovascular Disease

## 2015-09-17 ENCOUNTER — Encounter: Payer: Self-pay | Admitting: Cardiovascular Disease

## 2015-09-17 VITALS — BP 132/74 | HR 58 | Ht 73.0 in | Wt 178.0 lb

## 2015-09-17 DIAGNOSIS — I2510 Atherosclerotic heart disease of native coronary artery without angina pectoris: Secondary | ICD-10-CM

## 2015-09-17 DIAGNOSIS — R079 Chest pain, unspecified: Secondary | ICD-10-CM

## 2015-09-17 DIAGNOSIS — I159 Secondary hypertension, unspecified: Secondary | ICD-10-CM

## 2015-09-17 NOTE — Patient Instructions (Signed)
Medication Instructions:  No changes  Labwork: NONE  Testing/Procedures: Your physician has requested that you have en exercise stress myoview. For further information please visit [email or web address]. Please follow instruction sheet, as given.   Follow-Up: Your physician wants you to follow-up in: [place] will receive a reminder letter in the mail two months in advance. If you don't receive a letter, please call our office to schedule the follow-up appointment.  Any Other Special Instructions Will Be Listed Below (If Applicable).

## 2015-09-18 ENCOUNTER — Encounter (HOSPITAL_COMMUNITY): Payer: Self-pay

## 2015-09-18 ENCOUNTER — Encounter (HOSPITAL_COMMUNITY)
Admission: RE | Admit: 2015-09-18 | Discharge: 2015-09-18 | Disposition: A | Discharge status: Home / Self Care | Payer: Medicare HMO | Source: Ambulatory Visit | Attending: Orthopedic Surgery | Admitting: Orthopedic Surgery

## 2015-09-18 ENCOUNTER — Telehealth (HOSPITAL_COMMUNITY): Payer: Self-pay

## 2015-09-18 ENCOUNTER — Encounter (HOSPITAL_COMMUNITY)
Admission: RE | Admit: 2015-09-18 | Discharge: 2015-09-18 | Disposition: A | Discharge status: Home / Self Care | Payer: Medicare HMO | Source: Ambulatory Visit | Attending: Anesthesiology | Admitting: Anesthesiology

## 2015-09-18 DIAGNOSIS — R918 Other nonspecific abnormal finding of lung field: Secondary | ICD-10-CM | POA: Insufficient documentation

## 2015-09-18 DIAGNOSIS — Z7982 Long term (current) use of aspirin: Secondary | ICD-10-CM | POA: Insufficient documentation

## 2015-09-18 DIAGNOSIS — I2510 Atherosclerotic heart disease of native coronary artery without angina pectoris: Secondary | ICD-10-CM | POA: Diagnosis not present

## 2015-09-18 DIAGNOSIS — M19012 Primary osteoarthritis, left shoulder: Secondary | ICD-10-CM | POA: Diagnosis not present

## 2015-09-18 DIAGNOSIS — Z87891 Personal history of nicotine dependence: Secondary | ICD-10-CM | POA: Insufficient documentation

## 2015-09-18 DIAGNOSIS — Z79899 Other long term (current) drug therapy: Secondary | ICD-10-CM | POA: Insufficient documentation

## 2015-09-18 DIAGNOSIS — Z01818 Encounter for other preprocedural examination: Secondary | ICD-10-CM | POA: Diagnosis not present

## 2015-09-18 DIAGNOSIS — E785 Hyperlipidemia, unspecified: Secondary | ICD-10-CM | POA: Insufficient documentation

## 2015-09-18 DIAGNOSIS — R05 Cough: Secondary | ICD-10-CM | POA: Insufficient documentation

## 2015-09-18 DIAGNOSIS — J439 Emphysema, unspecified: Secondary | ICD-10-CM | POA: Insufficient documentation

## 2015-09-18 DIAGNOSIS — R058 Other specified cough: Secondary | ICD-10-CM

## 2015-09-18 DIAGNOSIS — Z01812 Encounter for preprocedural laboratory examination: Secondary | ICD-10-CM | POA: Diagnosis not present

## 2015-09-18 DIAGNOSIS — Z96611 Presence of right artificial shoulder joint: Secondary | ICD-10-CM | POA: Diagnosis not present

## 2015-09-18 HISTORY — DX: Frequency of micturition: R35.0

## 2015-09-18 HISTORY — DX: Emphysema, unspecified: J43.9

## 2015-09-18 HISTORY — DX: Pain in unspecified joint: M25.50

## 2015-09-18 HISTORY — DX: Benign prostatic hyperplasia without lower urinary tract symptoms: N40.0

## 2015-09-18 HISTORY — DX: Constipation, unspecified: K59.00

## 2015-09-18 HISTORY — DX: Cough: R05

## 2015-09-18 HISTORY — DX: Other abnormalities of gait and mobility: R26.89

## 2015-09-18 HISTORY — DX: Other specified cough: R05.8

## 2015-09-18 LAB — CBC
HEMATOCRIT: 39.8 % (ref 39.0–52.0)
Hemoglobin: 12.9 g/dL — ABNORMAL LOW (ref 13.0–17.0)
MCH: 29.7 pg (ref 26.0–34.0)
MCHC: 32.4 g/dL (ref 30.0–36.0)
MCV: 91.5 fL (ref 78.0–100.0)
Platelets: 245 10*3/uL (ref 150–400)
RBC: 4.35 MIL/uL (ref 4.22–5.81)
RDW: 13.7 % (ref 11.5–15.5)
WBC: 5.7 10*3/uL (ref 4.0–10.5)

## 2015-09-18 LAB — BASIC METABOLIC PANEL
ANION GAP: 6 (ref 5–15)
BUN: 19 mg/dL (ref 6–20)
CHLORIDE: 103 mmol/L (ref 101–111)
CO2: 29 mmol/L (ref 22–32)
Calcium: 9.2 mg/dL (ref 8.9–10.3)
Creatinine, Ser: 0.97 mg/dL (ref 0.61–1.24)
GFR calc Af Amer: >60 mL/min (ref >60)
GLUCOSE: 79 mg/dL (ref 65–99)
POTASSIUM: 4 mmol/L (ref 3.5–5.1)
Sodium: 138 mmol/L (ref 135–145)

## 2015-09-18 LAB — SURGICAL PCR SCREEN
MRSA, PCR: NEGATIVE
STAPHYLOCOCCUS AUREUS: NEGATIVE

## 2015-09-18 NOTE — Pre-Procedure Instructions (Signed)
Jaedyn Lard  09/18/2015      [facility] - [address], [address] [place] [address] Phone: [phone] Fax: [phone]    Your procedure is scheduled on Tues, Nov 1 @ 7:30 AM  Report to [facility] Admitting at 5:30 AM  Call this number if you have problems the morning of surgery:  [phone]   Remember:  Do not eat food or drink liquids after midnight.  Take these medicines the morning of surgery with A SIP OF WATER Synthroid(Levothyroxine),Metoprolol(Toprol),Oxybutynin(Ditropan),and Tamsulosin(Flomax)               Stop taking your Aspirin,COQ10,Vitamins,and any Herbal Medications. No Goody's,BC's,Aleve,or Ibuprofen.   Do not wear jewelry.  Do not wear lotions, powders, or colognes.  You may wear deodorant.             Men may shave face and neck.  Do not bring valuables to the hospital.  [facility] is not responsible for any belongings or valuables.  Contacts, dentures or bridgework may not be worn into surgery.  Leave your suitcase in the car.  After surgery it may be brought to your room.  For patients admitted to the hospital, discharge time will be determined by your treatment team.  Patients discharged the day of surgery will not be allowed to drive home.    Special instructions:  [place] - Preparing for Surgery  Before surgery, you can play an important role.  Because skin is not sterile, your skin needs to be as free of germs as possible.  You can reduce the number of germs on you skin by washing with CHG (chlorahexidine gluconate) soap before surgery.  CHG is an antiseptic cleaner which kills germs and bonds with the skin to continue killing germs even after washing.  Please DO NOT use if you have an allergy to CHG or antibacterial soaps.  If your skin becomes reddened/irritated stop using the CHG and inform your nurse when you arrive at Short Stay.  Do not shave (including legs and  underarms) for at least 48 hours prior to the first CHG shower.  You may shave your face.  Please follow these instructions carefully:   1.  Shower with CHG Soap the night before surgery and the                                morning of Surgery.  2.  If you choose to wash your hair, wash your hair first as usual with your       normal shampoo.  3.  After you shampoo, rinse your hair and body thoroughly to remove the                      Shampoo.  4.  Use CHG as you would any other liquid soap.  You can apply chg directly       to the skin and wash gently with scrungie or a clean washcloth.  5.  Apply the CHG Soap to your body ONLY FROM THE NECK DOWN.        Do not use on open wounds or open sores.  Avoid contact with your eyes,       ears, mouth and genitals (private parts).  Wash genitals (private parts)       with your normal soap.  6.  Wash thoroughly, paying special attention to the area where your surgery        will be performed.  7.  Thoroughly rinse your body with warm water from the neck down.  8.  DO NOT shower/wash with your normal soap after using and rinsing off       the CHG Soap.  9.  Pat yourself dry with a clean towel.            10.  Wear clean pajamas.            11.  Place clean sheets on your bed the night of your first shower and do not        sleep with pets.  Day of Surgery  Do not apply any lotions/deoderants the morning of surgery.  Please wear clean clothes to the hospital/surgery center.    Please read over the following fact sheets that you were given. Pain Booklet, Coughing and Deep Breathing, MRSA Information and Surgical Site Infection Prevention

## 2015-09-18 NOTE — Telephone Encounter (Signed)
Encounter complete. 

## 2015-09-18 NOTE — Progress Notes (Addendum)
Medical Md is Dr.[facility]  Cardiologist is Johnsie Cancel with last visit in epic from 09-15-15  Stress test in epic from 2012  Heart cath report in epic from 2010  Echo thinks he has had this done around 2012  EKG states was done at [place] office 09-15-15  CXR denies having one in the past yr  Scheduled for another Stress test on Oct 25

## 2015-09-21 NOTE — Progress Notes (Addendum)
Anesthesia Chart Review:  Pt is 80 year old male scheduled for L total shoulder arthroplasty on 09/29/2015 with Dr. Mardelle Matte  Cardiologist is Dr. Johnsie Cancel.   PMH includes: CAD, hyperlipidemia, COPD, emphysema, hypothyroidism, post-op N/V. Former smoker. BMI 23. S/p R total shoulder arthroplasty 12/16/14.   Medications include: ASA, levothyroxine, lovastatin, metoprolol.   Preoperative labs reviewed.    Chest x-ray 09/18/2015 reviewed. Possible pulmonary nodules. Comparison with prior studies if available is recommended. If none are available, CT can be performed.  EKG 09/17/2015: sinus bradycardia (58 bpm)  Cardiac cath 01/21/2009: -Nonobstructive CAD -Normal LV function  Pt is scheduled for stress test 09/22/15. Will revisit chart when results available.   Willeen Cass, FNP-BC [facility] Short Stay Surgical Center/Anesthesiology Phone: [phone] 09/21/2015 2:51 PM  Addendum:  09/22/15 Nuclear stress test: -The left ventricular ejection fraction is normal (55-65%). - Nuclear stress EF: 65%. - ST segment depression was noted during stress in the II, III, aVF, V5 and V6 leads. - This is a low risk study. Low risk stress nuclear study with ECG changes; small, moderate intensity, reversible inferior basal defect consistent with mild inferior ischemia; EF 65 with normal wall motion.  Based on stress test results, Dr. Johnsie Cancel recommended a LHC. 09/25/15 LHC (Dr. Angelena Form):  - Prox RCA lesion, 20% stenosed. - Mid RCA lesion, 20% stenosed. - Dist LAD lesion, 20% stenosed. - The left ventricular systolic function is normal. - 1st Mrg lesion, 60% stenosed. The vessel is small to moderate in size. The stenosis is eccentric and does not appear to be flow limiting. - Recommendations: He has a moderate eccentric stenosis in the first OM branch. This is a small to medium sized vessel. The lesion does not appear to be flow limiting. With upcoming surgery, we have discussed medical management of his  CAD for now. He would prefer to attempt medical management for now. Will proceed with surgery next week. If he has chest pain c/w angina going forward, could bring him back for re-look cath and PCI of the OM which would be technically easy to do.   Cardiologist Dr. Angelena Form has recommended patient proceed with surgery with plans for continued medical management for now.  Further evaluation on the day of surgery to ensure no acute cardiopulmonary issues. Of note, patient is getting a chest CT on 09/28/15 per Dr. Velna Hatchet as he had a question of pulmonary nodules on his 09/18/15 CXR.   George [facility] Short Stay Center/Anesthesiology Phone [phone] 09/25/2015 5:10 PM

## 2015-09-22 ENCOUNTER — Ambulatory Visit (HOSPITAL_COMMUNITY)
Admission: RE | Admit: 2015-09-22 | Discharge: 2015-09-22 | Disposition: A | Discharge status: Home / Self Care | Payer: Medicare HMO | Source: Ambulatory Visit | Attending: Cardiology | Admitting: Cardiology

## 2015-09-22 ENCOUNTER — Telehealth: Payer: Self-pay | Admitting: *Deleted

## 2015-09-22 ENCOUNTER — Other Ambulatory Visit: Payer: Self-pay | Admitting: Cardiovascular Disease

## 2015-09-22 DIAGNOSIS — R079 Chest pain, unspecified: Secondary | ICD-10-CM | POA: Insufficient documentation

## 2015-09-22 DIAGNOSIS — R9439 Abnormal result of other cardiovascular function study: Secondary | ICD-10-CM | POA: Diagnosis not present

## 2015-09-22 DIAGNOSIS — Z01810 Encounter for preprocedural cardiovascular examination: Secondary | ICD-10-CM

## 2015-09-22 DIAGNOSIS — Z01812 Encounter for preprocedural laboratory examination: Secondary | ICD-10-CM

## 2015-09-22 DIAGNOSIS — Z1212 Encounter for screening for malignant neoplasm of rectum: Secondary | ICD-10-CM | POA: Diagnosis not present

## 2015-09-22 DIAGNOSIS — I10 Essential (primary) hypertension: Secondary | ICD-10-CM | POA: Insufficient documentation

## 2015-09-22 DIAGNOSIS — Z87891 Personal history of nicotine dependence: Secondary | ICD-10-CM | POA: Diagnosis not present

## 2015-09-22 LAB — MYOCARDIAL PERFUSION IMAGING
CHL RATE OF PERCEIVED EXERTION: 16
CSEPED: 7 min
CSEPEW: 7.9 METS
CSEPHR: 88 %
CSEPPHR: 127 {beats}/min
Exercise duration (sec): 45 s
LV dias vol: 72 mL
LVSYSVOL: 25 mL
MPHR: 143 {beats}/min
Rest HR: 54 {beats}/min
SDS: 5
SRS: 1
SSS: 6
TID: 1.15

## 2015-09-22 MED ORDER — TECHNETIUM TC 99M SESTAMIBI GENERIC - CARDIOLITE
10.8000 | Freq: Once | INTRAVENOUS | Status: AC | PRN
  Administered 2015-09-22: 11 via INTRAVENOUS

## 2015-09-22 MED ORDER — TECHNETIUM TC 99M SESTAMIBI GENERIC - CARDIOLITE
32.1000 | Freq: Once | INTRAVENOUS | Status: AC | PRN
  Administered 2015-09-22: 32.1 via INTRAVENOUS

## 2015-09-22 NOTE — Telephone Encounter (Signed)
Patient aware and scheduled for cath Friday 09/25/15 Cath instructions reviewed with patient You are scheduled for a cardiac catheterization on 09/25/15 with Dr. Angelena Form    Go to [facility] [place] on 09/25/15 at 8:30  Enter thru the [facility] entrance A No food or drink after midnight night before  You may take your medications with a sip of water on the day of your procedure.    Notes Recorded by Earvin Hansen on 09/22/2015 at 5:41 PM Josue Hector, MD Thompson Grayer, RN   That would be fine to get it scheduled for this week with someone else    Notes Recorded by Josue Hector, MD on 09/22/2015 at 1:06 PM Abnormal myovue should probably have cath before shoulder surgery  Set up for me Tuesday November 1st in morning can have labs Monday

## 2015-09-23 ENCOUNTER — Other Ambulatory Visit (INDEPENDENT_AMBULATORY_CARE_PROVIDER_SITE_OTHER): Payer: Medicare HMO | Admitting: *Deleted

## 2015-09-23 ENCOUNTER — Other Ambulatory Visit: Payer: Self-pay | Admitting: Internal Medicine

## 2015-09-23 DIAGNOSIS — Z01812 Encounter for preprocedural laboratory examination: Secondary | ICD-10-CM

## 2015-09-23 DIAGNOSIS — Z01810 Encounter for preprocedural cardiovascular examination: Secondary | ICD-10-CM | POA: Diagnosis not present

## 2015-09-23 DIAGNOSIS — R911 Solitary pulmonary nodule: Secondary | ICD-10-CM

## 2015-09-23 DIAGNOSIS — I2510 Atherosclerotic heart disease of native coronary artery without angina pectoris: Secondary | ICD-10-CM | POA: Diagnosis not present

## 2015-09-23 NOTE — Telephone Encounter (Addendum)
Spoke with patient and informed patient that he can have the rest of his lab work done at cath lab at the time of his procedure. Patient wishes to have his lab work drawn earlier so he can pick up instruction letter. Letter made and lab appointment scheduled this afternoon. Patient understands to pick up his instruction letter at check-in.

## 2015-09-24 LAB — PROTIME-INR
INR: 1.03 (ref <1.50)
PROTHROMBIN TIME: 13.6 s (ref 11.6–15.2)

## 2015-09-24 LAB — APTT: aPTT: 33 seconds (ref 24–37)

## 2015-09-25 ENCOUNTER — Encounter (HOSPITAL_COMMUNITY): Payer: Self-pay | Admitting: Cardiovascular Disease

## 2015-09-25 ENCOUNTER — Ambulatory Visit (HOSPITAL_COMMUNITY)
Admission: RE | Admit: 2015-09-25 | Discharge: 2015-09-25 | Disposition: A | Discharge status: Home / Self Care | Payer: Medicare HMO | Source: Ambulatory Visit | Attending: Cardiovascular Disease | Admitting: Cardiovascular Disease

## 2015-09-25 ENCOUNTER — Encounter (HOSPITAL_COMMUNITY)
Admission: RE | Disposition: A | Discharge status: Home / Self Care | Payer: Self-pay | Source: Ambulatory Visit | Attending: Cardiovascular Disease

## 2015-09-25 DIAGNOSIS — Z7982 Long term (current) use of aspirin: Secondary | ICD-10-CM | POA: Insufficient documentation

## 2015-09-25 DIAGNOSIS — I2510 Atherosclerotic heart disease of native coronary artery without angina pectoris: Secondary | ICD-10-CM | POA: Diagnosis not present

## 2015-09-25 DIAGNOSIS — G250 Essential tremor: Secondary | ICD-10-CM | POA: Diagnosis not present

## 2015-09-25 DIAGNOSIS — R079 Chest pain, unspecified: Secondary | ICD-10-CM | POA: Insufficient documentation

## 2015-09-25 DIAGNOSIS — I10 Essential (primary) hypertension: Secondary | ICD-10-CM | POA: Insufficient documentation

## 2015-09-25 DIAGNOSIS — I25110 Atherosclerotic heart disease of native coronary artery with unstable angina pectoris: Secondary | ICD-10-CM | POA: Diagnosis not present

## 2015-09-25 DIAGNOSIS — R9439 Abnormal result of other cardiovascular function study: Secondary | ICD-10-CM | POA: Insufficient documentation

## 2015-09-25 DIAGNOSIS — R072 Precordial pain: Secondary | ICD-10-CM | POA: Insufficient documentation

## 2015-09-25 HISTORY — PX: CARDIAC CATHETERIZATION: SHX172

## 2015-09-25 SURGERY — LEFT HEART CATH AND CORONARY ANGIOGRAPHY
Anesthesia: LOCAL

## 2015-09-25 SURGERY — Surgical Case
Anesthesia: *Unknown

## 2015-09-25 MED ORDER — SODIUM CHLORIDE 0.9 % IJ SOLN: 3.0000 mL | INTRAMUSCULAR | Status: DC | PRN

## 2015-09-25 MED ORDER — MIDAZOLAM HCL 2 MG/2ML IJ SOLN
INTRAMUSCULAR | Status: DC | PRN
  Administered 2015-09-25: 1 mg via INTRAVENOUS

## 2015-09-25 MED ORDER — SODIUM CHLORIDE 0.9 % WEIGHT BASED INFUSION
3.0000 mL/kg/h | INTRAVENOUS | Status: DC
  Administered 2015-09-25: 3 mL/kg/h via INTRAVENOUS

## 2015-09-25 MED ORDER — FENTANYL CITRATE (PF) 100 MCG/2ML IJ SOLN
INTRAMUSCULAR | Status: DC | PRN
  Administered 2015-09-25: 25 ug via INTRAVENOUS

## 2015-09-25 MED ORDER — SODIUM CHLORIDE 0.9 % WEIGHT BASED INFUSION: 1.0000 mL/kg/h | INTRAVENOUS | Status: DC

## 2015-09-25 MED ORDER — SODIUM CHLORIDE 0.9 % IJ SOLN: 3.0000 mL | Freq: Two times a day (BID) | INTRAMUSCULAR | Status: DC

## 2015-09-25 MED ORDER — SODIUM CHLORIDE 0.9 % IV SOLN: 250.0000 mL | INTRAVENOUS | Status: DC | PRN

## 2015-09-25 MED ORDER — VERAPAMIL HCL 2.5 MG/ML IV SOLN
INTRAVENOUS | Status: AC
  Filled 2015-09-25: qty 2

## 2015-09-25 MED ORDER — FENTANYL CITRATE (PF) 100 MCG/2ML IJ SOLN
INTRAMUSCULAR | Status: AC
  Filled 2015-09-25: qty 4

## 2015-09-25 MED ORDER — LIDOCAINE HCL (PF) 1 % IJ SOLN
INTRAMUSCULAR | Status: AC
  Filled 2015-09-25: qty 30

## 2015-09-25 MED ORDER — SODIUM CHLORIDE 0.9 % IV SOLN: INTRAVENOUS | Status: AC

## 2015-09-25 MED ORDER — HEPARIN SODIUM (PORCINE) 1000 UNIT/ML IJ SOLN
INTRAMUSCULAR | Status: DC | PRN
  Administered 2015-09-25: 4000 [IU] via INTRAVENOUS

## 2015-09-25 MED ORDER — LIDOCAINE HCL (PF) 1 % IJ SOLN
INTRAMUSCULAR | Status: DC | PRN
  Administered 2015-09-25: 2 mL

## 2015-09-25 MED ORDER — HEPARIN (PORCINE) IN NACL 2-0.9 UNIT/ML-% IJ SOLN
INTRAMUSCULAR | Status: DC | PRN
  Administered 2015-09-25: 11:00:00

## 2015-09-25 MED ORDER — MIDAZOLAM HCL 2 MG/2ML IJ SOLN
INTRAMUSCULAR | Status: AC
  Filled 2015-09-25: qty 4

## 2015-09-25 MED ORDER — VERAPAMIL HCL 2.5 MG/ML IV SOLN
INTRAVENOUS | Status: DC | PRN
  Administered 2015-09-25: 10 mL via INTRA_ARTERIAL

## 2015-09-25 MED ORDER — IOHEXOL 350 MG/ML SOLN
INTRAVENOUS | Status: DC | PRN
  Administered 2015-09-25: 90 mL via INTRAVENOUS

## 2015-09-25 MED ORDER — ASPIRIN 81 MG PO CHEW
81.0000 mg | CHEWABLE_TABLET | ORAL | Status: AC
  Administered 2015-09-25: 81 mg via ORAL

## 2015-09-25 MED ORDER — HEPARIN SODIUM (PORCINE) 1000 UNIT/ML IJ SOLN
INTRAMUSCULAR | Status: AC
  Filled 2015-09-25: qty 1

## 2015-09-25 MED ORDER — HEPARIN (PORCINE) IN NACL 2-0.9 UNIT/ML-% IJ SOLN
INTRAMUSCULAR | Status: AC
  Filled 2015-09-25: qty 1000

## 2015-09-25 MED ORDER — ASPIRIN 81 MG PO CHEW
CHEWABLE_TABLET | ORAL | Status: AC
  Administered 2015-09-25: 81 mg via ORAL
  Filled 2015-09-25: qty 1

## 2015-09-25 SURGICAL SUPPLY — 12 items
CATH INFINITI 5F JL4 125CM (CATHETERS) ×2 IMPLANT
CATH INFINITI 5F PIG 125CM (CATHETERS) ×2 IMPLANT
CATH INFINITI JR4 5F (CATHETERS) ×2 IMPLANT
DEVICE RAD COMP TR BAND LRG (VASCULAR PRODUCTS) ×2 IMPLANT
GLIDESHEATH SLEND SS 6F .021 (SHEATH) ×2 IMPLANT
KIT ENCORE 26 ADVANTAGE (KITS) IMPLANT
KIT HEART LEFT (KITS) ×2 IMPLANT
PACK CARDIAC CATHETERIZATION (CUSTOM PROCEDURE TRAY) ×2 IMPLANT
SYR MEDRAD MARK V 150ML (SYRINGE) ×2 IMPLANT
TRANSDUCER W/STOPCOCK (MISCELLANEOUS) ×2 IMPLANT
TUBING CIL FLEX 10 FLL-RA (TUBING) ×2 IMPLANT
WIRE SAFE-T 1.5MM-J .035X260CM (WIRE) ×2 IMPLANT

## 2015-09-25 NOTE — Interval H&P Note (Signed)
History and Physical Interval Note:  09/25/2015 10:45 AM  Jay Bradshaw  has presented today for cardiac cath with the diagnosis of abnormal stress test, unstable angina. The various methods of treatment have been discussed with the patient and family. After consideration of risks, benefits and other options for treatment, the patient has consented to  Procedure(s): Left Heart Cath and Coronary Angiography (N/A) as a surgical intervention .  The patient's history has been reviewed, patient examined, no change in status, stable for surgery.  I have reviewed the patient's chart and labs.  Questions were answered to the patient's satisfaction.    Cath Lab Visit (complete for each Cath Lab visit)  Clinical Evaluation Leading to the Procedure:   ACS: Yes.    Non-ACS:    Anginal Classification: CCS III  Anti-ischemic medical therapy: Minimal Therapy (1 class of medications)  Non-Invasive Test Results: Low-risk stress test findings: cardiac mortality <1%/year  Prior CABG: No previous CABG        Isaac Dubie

## 2015-09-25 NOTE — H&P (View-Only) (Signed)
Patient ID: Jay Bradshaw, male   DOB: 12-09-1934, 80 y.o.   MRN: [id number] 79 y.o.  moving from [place] Wanted to "establish" with heart doctor. Describes having cath about 8 years ago No intervention done ? Moderate disease. No repeat cath since that time. 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 Has been on beta blocker for HTN and since heart cath. Takes ASA No dyspnea, palpitations or syncope Compliant with meds. Does not smoke or drink No chest pain    2/15 TC 147  LDL 87  Normal LFTls   Needs shoulder surgery with Dr Mardelle Matte Right shoulder done January without issue.  But has had increasing chest pains since February.  Not always exertional Central pressure.  Wife was hit by a run away car at rest stop in [place] and he has had to care for her Stress of this seems to have ppt more pains  Last myovue in [place] reviewed normal no ischemia EF 70%   ROS: Denies fever, malais, weight loss, blurry vision, decreased visual acuity, cough, sputum, SOB, hemoptysis, pleuritic pain, palpitaitons, heartburn, abdominal pain, melena, lower extremity edema, claudication, or rash.  All other systems reviewed and negative  General: Affect appropriate Healthy:  appears stated age 24: normal Neck supple with no adenopathy JVP normal no bruits no thyromegaly Lungs clear with no wheezing and good diaphragmatic motion Heart:  S1/S2 no murmur, no rub, gallop or click PMI normal Abdomen: benighn, BS positve, no tenderness, no AAA no bruit.  No HSM or HJR Distal pulses intact with no bruits No edema Neuro non-focal UE tremor  Skin warm and dry Decreased ROM UE;s right greater than left    Current Outpatient Prescriptions  Medication Sig Dispense Refill  . alendronate (FOSAMAX) 70 MG tablet Take 70 mg by mouth once a week. Take with a full glass of water on an  empty stomach.    Marland Kitchen aspirin 81 MG tablet Take 81 mg by mouth daily.    Marland Kitchen CALCIUM-MAGNESIUM PO Take 500-600 mg by mouth 2 (two) times daily.    . cholecalciferol (VITAMIN D) 400 UNITS TABS tablet Take 400 Units by mouth 2 (two) times daily.    . Coenzyme Q10 (CO Q10) 100 MG CAPS Take 200 mg by mouth daily.    Marland Kitchen docusate sodium (COLACE) 100 MG capsule Take 100 mg by mouth daily as needed for mild constipation.    Marland Kitchen levothyroxine (SYNTHROID, LEVOTHROID) 88 MCG tablet Take 88 mcg by mouth daily before breakfast.    . lovastatin (MEVACOR) 20 MG tablet Take 20 mg by mouth at bedtime.    . Melatonin 3 MG TABS Take 1 tablet by mouth at bedtime as needed (sleep).    . metoprolol succinate (TOPROL-XL) 50 MG 24 hr tablet Take 50 mg by mouth daily. Take with or immediately following a meal.    . Multiple Vitamin (MULTIVITAMIN) capsule Take 1 capsule by mouth every other day.     . Multiple Vitamins-Minerals (PRESERVISION AREDS 2) CAPS Take 1 capsule by mouth every other day.    . nitroGLYCERIN (NITROSTAT) 0.4 MG SL tablet Place 0.4 mg under the tongue every 5 (five) minutes as needed for chest pain (up to 3 doses MAX).    Marland Kitchen oxybutynin (DITROPAN) 5 MG tablet Take 1.25 mg by mouth 2 (two) times daily.     Marland Kitchen  PSYLLIUM HUSK PO Take 1 capsule by mouth 3 (three) times daily as needed (depending on meals he ate that day).     . saw palmetto 160 MG capsule Take 160 mg by mouth 3 (three) times daily.    . tamsulosin (FLOMAX) 0.4 MG CAPS capsule Take 0.4 mg by mouth daily.    . vitamin C (ASCORBIC ACID) 500 MG tablet Take 500 mg by mouth every other day.     . vitamin E (VITAMIN E) 400 UNIT capsule Take 300 Units by mouth every other day.      No current facility-administered medications for this visit.    Allergies  Review of patient's allergies indicates no known allergies.  Electrocardiogram:  NSR normal ECG 2014  NSR rate 58 normal no change   Assessment and Plan CAD: Moderate non ischemic myovue 2012  Preop shoulder surgery F/U exercise myovue in light of more frequent chest pains Tremor: seems better continue beta blocker  Thyroid:  On replacement TSH normal Ortho:  Has had good result with right shoulder surgery ok for left if myovue normal  Jenkins Rouge

## 2015-09-25 NOTE — Discharge Instructions (Signed)
Radial Site Care °Refer to this sheet in the next few weeks. These instructions provide you with information about caring for yourself after your procedure. Your health care provider may also give you more specific instructions. Your treatment has been planned according to current medical practices, but problems sometimes occur. Call your health care provider if you have any problems or questions after your procedure. °WHAT TO EXPECT AFTER THE PROCEDURE °After your procedure, it is typical to have the following: °· Bruising at the radial site that usually fades within 1-2 weeks. °· Blood collecting in the tissue (hematoma) that may be painful to the touch. It should usually decrease in size and tenderness within 1-2 weeks. °HOME CARE INSTRUCTIONS °· Take medicines only as directed by your health care provider. °· You may shower 24-48 hours after the procedure or as directed by your health care provider. Remove the bandage (dressing) and gently wash the site with plain soap and water. Pat the area dry with a clean towel. Do not rub the site, because this may cause bleeding. °· Do not take baths, swim, or use a hot tub until your health care provider approves. °· Check your insertion site every day for redness, swelling, or drainage. °· Do not apply powder or lotion to the site. °· Do not flex or bend the affected arm for 24 hours or as directed by your health care provider. °· Do not push or pull heavy objects with the affected arm for 24 hours or as directed by your health care provider. °· Do not lift over 10 lb (4.5 kg) for 5 days after your procedure or as directed by your health care provider. °· Ask your health care provider when it is okay to: °¨ Return to work or school. °¨ Resume usual physical activities or sports. °¨ Resume sexual activity. °· Do not drive home if you are discharged the same day as the procedure. Have someone else drive you. °· You may drive 24 hours after the procedure unless otherwise  instructed by your health care provider. °· Do not operate machinery or power tools for 24 hours after the procedure. °· If your procedure was done as an outpatient procedure, which means that you went home the same day as your procedure, a responsible adult should be with you for the first 24 hours after you arrive home. °· Keep all follow-up visits as directed by your health care provider. This is important. °SEEK MEDICAL CARE IF: °· You have a fever. °· You have chills. °· You have increased bleeding from the radial site. Hold pressure on the site. °SEEK IMMEDIATE MEDICAL CARE IF: °· You have unusual pain at the radial site. °· You have redness, warmth, or swelling at the radial site. °· You have drainage (other than a small amount of blood on the dressing) from the radial site. °· The radial site is bleeding, and the bleeding does not stop after 30 minutes of holding steady pressure on the site. °· Your arm or hand becomes pale, cool, tingly, or numb. °  °This information is not intended to replace advice given to you by your health care provider. Make sure you discuss any questions you have with your health care provider. °  °Document Released: 12/17/2010 Document Revised: 12/05/2014 Document Reviewed: 06/02/2014 °Elsevier Interactive Patient Education ©2016 [employer] ° °

## 2015-09-28 ENCOUNTER — Ambulatory Visit
Admission: RE | Admit: 2015-09-28 | Discharge: 2015-09-28 | Disposition: A | Discharge status: Home / Self Care | Payer: Medicare HMO | Source: Ambulatory Visit | Attending: Internal Medicine | Admitting: Internal Medicine

## 2015-09-28 DIAGNOSIS — R911 Solitary pulmonary nodule: Secondary | ICD-10-CM | POA: Diagnosis not present

## 2015-09-28 MED ORDER — CEFAZOLIN SODIUM-DEXTROSE 2-3 GM-% IV SOLR
2.0000 g | INTRAVENOUS | Status: AC
  Administered 2015-09-29: 2 g via INTRAVENOUS
  Filled 2015-09-28: qty 50

## 2015-09-29 ENCOUNTER — Inpatient Hospital Stay (HOSPITAL_COMMUNITY): Payer: Medicare HMO | Admitting: Anesthesiology

## 2015-09-29 ENCOUNTER — Encounter (HOSPITAL_COMMUNITY)
Admission: RE | Disposition: A | Discharge status: Home / Self Care | Payer: Self-pay | Source: Ambulatory Visit | Attending: Orthopedic Surgery

## 2015-09-29 ENCOUNTER — Encounter (HOSPITAL_COMMUNITY): Payer: Self-pay | Admitting: *Deleted

## 2015-09-29 ENCOUNTER — Inpatient Hospital Stay (HOSPITAL_COMMUNITY): Payer: Medicare HMO

## 2015-09-29 ENCOUNTER — Inpatient Hospital Stay (HOSPITAL_COMMUNITY)
Admission: RE | Admit: 2015-09-29 | Discharge: 2015-10-02 | DRG: 483 | Disposition: A | Discharge status: Home / Self Care | Payer: Medicare HMO | Source: Ambulatory Visit | Attending: Orthopedic Surgery | Admitting: Orthopedic Surgery

## 2015-09-29 ENCOUNTER — Inpatient Hospital Stay (HOSPITAL_COMMUNITY): Payer: Medicare HMO | Admitting: Emergency Medicine

## 2015-09-29 DIAGNOSIS — E785 Hyperlipidemia, unspecified: Secondary | ICD-10-CM | POA: Diagnosis present

## 2015-09-29 DIAGNOSIS — Z7983 Long term (current) use of bisphosphonates: Secondary | ICD-10-CM

## 2015-09-29 DIAGNOSIS — Z7982 Long term (current) use of aspirin: Secondary | ICD-10-CM

## 2015-09-29 DIAGNOSIS — R4182 Altered mental status, unspecified: Secondary | ICD-10-CM | POA: Diagnosis not present

## 2015-09-29 DIAGNOSIS — Z96612 Presence of left artificial shoulder joint: Secondary | ICD-10-CM | POA: Diagnosis not present

## 2015-09-29 DIAGNOSIS — Z9841 Cataract extraction status, right eye: Secondary | ICD-10-CM | POA: Diagnosis not present

## 2015-09-29 DIAGNOSIS — I2510 Atherosclerotic heart disease of native coronary artery without angina pectoris: Secondary | ICD-10-CM | POA: Diagnosis not present

## 2015-09-29 DIAGNOSIS — G8918 Other acute postprocedural pain: Secondary | ICD-10-CM | POA: Diagnosis not present

## 2015-09-29 DIAGNOSIS — Z9842 Cataract extraction status, left eye: Secondary | ICD-10-CM | POA: Diagnosis not present

## 2015-09-29 DIAGNOSIS — N400 Enlarged prostate without lower urinary tract symptoms: Secondary | ICD-10-CM | POA: Diagnosis present

## 2015-09-29 DIAGNOSIS — M19012 Primary osteoarthritis, left shoulder: Principal | ICD-10-CM

## 2015-09-29 DIAGNOSIS — Z79899 Other long term (current) drug therapy: Secondary | ICD-10-CM

## 2015-09-29 DIAGNOSIS — K5900 Constipation, unspecified: Secondary | ICD-10-CM | POA: Diagnosis present

## 2015-09-29 DIAGNOSIS — R350 Frequency of micturition: Secondary | ICD-10-CM | POA: Diagnosis present

## 2015-09-29 DIAGNOSIS — J449 Chronic obstructive pulmonary disease, unspecified: Secondary | ICD-10-CM | POA: Diagnosis not present

## 2015-09-29 DIAGNOSIS — E039 Hypothyroidism, unspecified: Secondary | ICD-10-CM | POA: Diagnosis present

## 2015-09-29 DIAGNOSIS — Y92239 Unspecified place in hospital as the place of occurrence of the external cause: Secondary | ICD-10-CM | POA: Diagnosis not present

## 2015-09-29 DIAGNOSIS — Z471 Aftercare following joint replacement surgery: Secondary | ICD-10-CM | POA: Diagnosis not present

## 2015-09-29 DIAGNOSIS — Z87891 Personal history of nicotine dependence: Secondary | ICD-10-CM | POA: Diagnosis not present

## 2015-09-29 DIAGNOSIS — T402X5A Adverse effect of other opioids, initial encounter: Secondary | ICD-10-CM | POA: Diagnosis not present

## 2015-09-29 DIAGNOSIS — Z96619 Presence of unspecified artificial shoulder joint: Secondary | ICD-10-CM

## 2015-09-29 HISTORY — DX: Other seasonal allergic rhinitis: J30.2

## 2015-09-29 HISTORY — PX: TOTAL SHOULDER ARTHROPLASTY: SHX126

## 2015-09-29 HISTORY — DX: Primary osteoarthritis, left shoulder: M19.012

## 2015-09-29 SURGERY — ARTHROPLASTY, SHOULDER, TOTAL
Anesthesia: Regional | Site: Shoulder | Laterality: Left

## 2015-09-29 MED ORDER — METHOCARBAMOL 1000 MG/10ML IJ SOLN: 500.0000 mg | Freq: Four times a day (QID) | INTRAVENOUS | Status: DC | PRN

## 2015-09-29 MED ORDER — DOCUSATE SODIUM 100 MG PO CAPS
100.0000 mg | ORAL_CAPSULE | Freq: Two times a day (BID) | ORAL | Status: DC
  Administered 2015-09-29 – 2015-10-02 (×5): 100 mg via ORAL
  Filled 2015-09-29 (×6): qty 1

## 2015-09-29 MED ORDER — FENTANYL CITRATE (PF) 100 MCG/2ML IJ SOLN
INTRAMUSCULAR | Status: DC | PRN
  Administered 2015-09-29: 100 ug via INTRAVENOUS

## 2015-09-29 MED ORDER — METOCLOPRAMIDE HCL 5 MG/ML IJ SOLN: 5.0000 mg | Freq: Three times a day (TID) | INTRAMUSCULAR | Status: DC | PRN

## 2015-09-29 MED ORDER — LIDOCAINE HCL 4 % MT SOLN
OROMUCOSAL | Status: DC | PRN
  Administered 2015-09-29: 4 mL via TOPICAL

## 2015-09-29 MED ORDER — ONDANSETRON HCL 4 MG/2ML IJ SOLN
INTRAMUSCULAR | Status: AC
  Administered 2015-09-29: 15:00:00
  Filled 2015-09-29: qty 2

## 2015-09-29 MED ORDER — PHENOL 1.4 % MT LIQD
1.0000 | OROMUCOSAL | Status: DC | PRN
Start: 2015-09-29 — End: 2015-10-02

## 2015-09-29 MED ORDER — OXYBUTYNIN CHLORIDE 5 MG/5ML PO SYRP
1.2500 mg | ORAL_SOLUTION | Freq: Two times a day (BID) | ORAL | Status: DC
  Administered 2015-09-29: 1.25 mg via ORAL
  Filled 2015-09-29 (×4): qty 1.3

## 2015-09-29 MED ORDER — ONDANSETRON HCL 4 MG PO TABS: 4.0000 mg | ORAL_TABLET | Freq: Three times a day (TID) | ORAL | Status: DC | PRN

## 2015-09-29 MED ORDER — MIDAZOLAM HCL 2 MG/2ML IJ SOLN
INTRAMUSCULAR | Status: AC
  Filled 2015-09-29: qty 4

## 2015-09-29 MED ORDER — ONDANSETRON HCL 4 MG/2ML IJ SOLN: 4.0000 mg | Freq: Four times a day (QID) | INTRAMUSCULAR | Status: DC | PRN

## 2015-09-29 MED ORDER — ACETAMINOPHEN 650 MG RE SUPP: 650.0000 mg | Freq: Four times a day (QID) | RECTAL | Status: DC | PRN

## 2015-09-29 MED ORDER — PSYLLIUM 95 % PO PACK: 1.0000 | PACK | Freq: Three times a day (TID) | ORAL | Status: DC | PRN

## 2015-09-29 MED ORDER — OXYCODONE HCL 5 MG PO TABS
5.0000 mg | ORAL_TABLET | ORAL | Status: DC | PRN
  Administered 2015-09-29: 10 mg via ORAL
  Administered 2015-09-29: 5 mg via ORAL
  Administered 2015-09-30: 10 mg via ORAL
  Administered 2015-09-30: 5 mg via ORAL
  Filled 2015-09-29: qty 2
  Filled 2015-09-29 (×2): qty 1
  Filled 2015-09-29: qty 2

## 2015-09-29 MED ORDER — PROPOFOL 10 MG/ML IV BOLUS: INTRAVENOUS | Status: DC | PRN

## 2015-09-29 MED ORDER — PROSIGHT PO TABS
1.0000 | ORAL_TABLET | ORAL | Status: DC
  Administered 2015-09-29 – 2015-10-01 (×2): 1 via ORAL
  Filled 2015-09-29 (×2): qty 1

## 2015-09-29 MED ORDER — FENTANYL CITRATE (PF) 100 MCG/2ML IJ SOLN
INTRAMUSCULAR | Status: AC
  Administered 2015-09-29: 15:00:00
  Filled 2015-09-29: qty 2

## 2015-09-29 MED ORDER — PRAVASTATIN SODIUM 10 MG PO TABS
20.0000 mg | ORAL_TABLET | Freq: Every day | ORAL | Status: DC
  Administered 2015-09-29 – 2015-10-01 (×3): 20 mg via ORAL
  Filled 2015-09-29 (×3): qty 2

## 2015-09-29 MED ORDER — SUGAMMADEX SODIUM 200 MG/2ML IV SOLN
INTRAVENOUS | Status: DC | PRN
  Administered 2015-09-29: 200 mg via INTRAVENOUS

## 2015-09-29 MED ORDER — METHOCARBAMOL 500 MG PO TABS: 500.0000 mg | ORAL_TABLET | Freq: Four times a day (QID) | ORAL | Status: DC | PRN

## 2015-09-29 MED ORDER — SUGAMMADEX SODIUM 200 MG/2ML IV SOLN
INTRAVENOUS | Status: AC
  Filled 2015-09-29: qty 2

## 2015-09-29 MED ORDER — POLYETHYLENE GLYCOL 3350 17 G PO PACK: 17.0000 g | PACK | Freq: Every day | ORAL | Status: DC | PRN

## 2015-09-29 MED ORDER — ONDANSETRON HCL 4 MG/2ML IJ SOLN
INTRAMUSCULAR | Status: DC | PRN
  Administered 2015-09-29: 4 mg via INTRAVENOUS

## 2015-09-29 MED ORDER — SENNA 8.6 MG PO TABS
1.0000 | ORAL_TABLET | Freq: Two times a day (BID) | ORAL | Status: DC
Start: 2015-09-29 — End: 2015-10-02
  Administered 2015-09-29 – 2015-10-02 (×6): 8.6 mg via ORAL
  Filled 2015-09-29 (×6): qty 1

## 2015-09-29 MED ORDER — DOCUSATE SODIUM 100 MG PO CAPS: 100.0000 mg | ORAL_CAPSULE | Freq: Every day | ORAL | Status: DC | PRN

## 2015-09-29 MED ORDER — CALCIUM-MAGNESIUM 100-50 MG PO TABS: ORAL_TABLET | Freq: Two times a day (BID) | ORAL | Status: DC

## 2015-09-29 MED ORDER — VITAMIN C 500 MG PO TABS
500.0000 mg | ORAL_TABLET | ORAL | Status: DC
  Administered 2015-09-29 – 2015-10-01 (×2): 500 mg via ORAL
  Filled 2015-09-29 (×2): qty 1

## 2015-09-29 MED ORDER — CO Q10 100 MG PO CAPS: 200.0000 mg | ORAL_CAPSULE | Freq: Every day | ORAL | Status: DC

## 2015-09-29 MED ORDER — LACTATED RINGERS IV SOLN
INTRAVENOUS | Status: DC | PRN
  Administered 2015-09-29 (×2): via INTRAVENOUS

## 2015-09-29 MED ORDER — METOPROLOL SUCCINATE ER 50 MG PO TB24
50.0000 mg | ORAL_TABLET | Freq: Every day | ORAL | Status: DC
  Administered 2015-09-29 – 2015-10-01 (×3): 50 mg via ORAL
  Filled 2015-09-29 (×3): qty 1

## 2015-09-29 MED ORDER — TAMSULOSIN HCL 0.4 MG PO CAPS
0.4000 mg | ORAL_CAPSULE | Freq: Every day | ORAL | Status: DC
  Administered 2015-09-30 – 2015-10-02 (×3): 0.4 mg via ORAL
  Filled 2015-09-29 (×4): qty 1

## 2015-09-29 MED ORDER — FENTANYL CITRATE (PF) 100 MCG/2ML IJ SOLN
25.0000 ug | INTRAMUSCULAR | Status: DC | PRN
  Administered 2015-09-29: 50 ug via INTRAVENOUS

## 2015-09-29 MED ORDER — OXYCODONE-ACETAMINOPHEN 5-325 MG PO TABS: 1.0000 | ORAL_TABLET | Freq: Four times a day (QID) | ORAL | Status: DC | PRN

## 2015-09-29 MED ORDER — SENNA-DOCUSATE SODIUM 8.6-50 MG PO TABS: 2.0000 | ORAL_TABLET | Freq: Every day | ORAL | Status: DC

## 2015-09-29 MED ORDER — METOCLOPRAMIDE HCL 5 MG PO TABS: 5.0000 mg | ORAL_TABLET | Freq: Three times a day (TID) | ORAL | Status: DC | PRN

## 2015-09-29 MED ORDER — PROPOFOL 10 MG/ML IV BOLUS
INTRAVENOUS | Status: AC
  Filled 2015-09-29: qty 20

## 2015-09-29 MED ORDER — MELATONIN 3 MG PO TABS
1.0000 | ORAL_TABLET | Freq: Every evening | ORAL | Status: DC | PRN
  Filled 2015-09-29: qty 1

## 2015-09-29 MED ORDER — BISACODYL 10 MG RE SUPP: 10.0000 mg | Freq: Every day | RECTAL | Status: DC | PRN

## 2015-09-29 MED ORDER — LIDOCAINE HCL (CARDIAC) 20 MG/ML IV SOLN
INTRAVENOUS | Status: DC | PRN
  Administered 2015-09-29: 100 mg via INTRAVENOUS

## 2015-09-29 MED ORDER — SODIUM CHLORIDE 0.9 % IR SOLN
Status: DC | PRN
  Administered 2015-09-29: 3000 mL
  Administered 2015-09-29: 1000 mL

## 2015-09-29 MED ORDER — ONDANSETRON HCL 4 MG/2ML IJ SOLN
4.0000 mg | Freq: Once | INTRAMUSCULAR | Status: AC | PRN
Start: 2015-09-29 — End: 2015-09-29
  Administered 2015-09-29: 4 mg via INTRAVENOUS

## 2015-09-29 MED ORDER — VITAMIN E 45 MG (100 UNIT) PO CAPS
300.0000 [IU] | ORAL_CAPSULE | ORAL | Status: DC
  Administered 2015-09-29 – 2015-10-01 (×2): 300 [IU] via ORAL
  Filled 2015-09-29 (×3): qty 3

## 2015-09-29 MED ORDER — ACETAMINOPHEN 325 MG PO TABS
650.0000 mg | ORAL_TABLET | Freq: Four times a day (QID) | ORAL | Status: DC | PRN
  Administered 2015-09-30 – 2015-10-01 (×3): 650 mg via ORAL
  Filled 2015-09-29 (×3): qty 2

## 2015-09-29 MED ORDER — BUPIVACAINE HCL (PF) 0.25 % IJ SOLN
INTRAMUSCULAR | Status: AC
  Filled 2015-09-29: qty 30

## 2015-09-29 MED ORDER — POTASSIUM CHLORIDE IN NACL 20-0.45 MEQ/L-% IV SOLN
INTRAVENOUS | Status: DC
Start: 2015-09-29 — End: 2015-10-02
  Administered 2015-09-29: 16:00:00 via INTRAVENOUS
  Filled 2015-09-29 (×7): qty 1000

## 2015-09-29 MED ORDER — PROPOFOL 10 MG/ML IV BOLUS
INTRAVENOUS | Status: DC | PRN
  Administered 2015-09-29: 150 mg via INTRAVENOUS

## 2015-09-29 MED ORDER — ADULT MULTIVITAMIN W/MINERALS CH
1.0000 | ORAL_TABLET | ORAL | Status: DC
  Administered 2015-09-29 – 2015-10-01 (×2): 1 via ORAL
  Filled 2015-09-29 (×2): qty 1

## 2015-09-29 MED ORDER — LEVOTHYROXINE SODIUM 88 MCG PO TABS
88.0000 ug | ORAL_TABLET | Freq: Every day | ORAL | Status: DC
  Administered 2015-09-30 – 2015-10-02 (×3): 88 ug via ORAL
  Filled 2015-09-29 (×3): qty 1

## 2015-09-29 MED ORDER — HYDROMORPHONE HCL 1 MG/ML IJ SOLN
0.5000 mg | INTRAMUSCULAR | Status: DC | PRN
  Administered 2015-09-30: 0.5 mg via INTRAVENOUS
  Filled 2015-09-29: qty 1

## 2015-09-29 MED ORDER — CHOLECALCIFEROL 10 MCG (400 UNIT) PO TABS
400.0000 [IU] | ORAL_TABLET | Freq: Two times a day (BID) | ORAL | Status: DC
  Administered 2015-09-29 – 2015-10-02 (×7): 400 [IU] via ORAL
  Filled 2015-09-29 (×8): qty 1

## 2015-09-29 MED ORDER — DIPHENHYDRAMINE HCL 12.5 MG/5ML PO ELIX: 12.5000 mg | ORAL_SOLUTION | ORAL | Status: DC | PRN

## 2015-09-29 MED ORDER — ALUM & MAG HYDROXIDE-SIMETH 200-200-20 MG/5ML PO SUSP: 30.0000 mL | ORAL | Status: DC | PRN

## 2015-09-29 MED ORDER — CEFAZOLIN SODIUM 1-5 GM-% IV SOLN
INTRAVENOUS | Status: AC
  Administered 2015-09-29: 1000 mg
  Filled 2015-09-29: qty 50

## 2015-09-29 MED ORDER — PHENYLEPHRINE HCL 10 MG/ML IJ SOLN
10.0000 mg | INTRAVENOUS | Status: DC | PRN
  Administered 2015-09-29: 30 ug/min via INTRAVENOUS

## 2015-09-29 MED ORDER — ONDANSETRON HCL 4 MG PO TABS
4.0000 mg | ORAL_TABLET | Freq: Four times a day (QID) | ORAL | Status: DC | PRN
  Administered 2015-09-29: 4 mg via ORAL
  Filled 2015-09-29: qty 1

## 2015-09-29 MED ORDER — MENTHOL 3 MG MT LOZG: 1.0000 | LOZENGE | OROMUCOSAL | Status: DC | PRN

## 2015-09-29 MED ORDER — BUPIVACAINE-EPINEPHRINE (PF) 0.5% -1:200000 IJ SOLN
INTRAMUSCULAR | Status: DC | PRN
  Administered 2015-09-29: 20 mL via PERINEURAL

## 2015-09-29 MED ORDER — CEFAZOLIN SODIUM 1-5 GM-% IV SOLN
1.0000 g | Freq: Four times a day (QID) | INTRAVENOUS | Status: AC
  Administered 2015-09-29 – 2015-09-30 (×3): 1 g via INTRAVENOUS
  Filled 2015-09-29 (×3): qty 50

## 2015-09-29 MED ORDER — ROCURONIUM BROMIDE 100 MG/10ML IV SOLN
INTRAVENOUS | Status: DC | PRN
  Administered 2015-09-29: 50 mg via INTRAVENOUS

## 2015-09-29 MED ORDER — NITROGLYCERIN 0.4 MG SL SUBL: 0.4000 mg | SUBLINGUAL_TABLET | SUBLINGUAL | Status: DC | PRN

## 2015-09-29 MED ORDER — FENTANYL CITRATE (PF) 250 MCG/5ML IJ SOLN
INTRAMUSCULAR | Status: AC
  Filled 2015-09-29: qty 5

## 2015-09-29 MED ORDER — ASPIRIN 81 MG PO CHEW
81.0000 mg | CHEWABLE_TABLET | Freq: Every day | ORAL | Status: DC
  Administered 2015-09-29 – 2015-10-02 (×4): 81 mg via ORAL
  Filled 2015-09-29 (×4): qty 1

## 2015-09-29 MED ORDER — EPHEDRINE SULFATE 50 MG/ML IJ SOLN
INTRAMUSCULAR | Status: DC | PRN
  Administered 2015-09-29: 10 mg via INTRAVENOUS
  Administered 2015-09-29: 5 mg via INTRAVENOUS
  Administered 2015-09-29: 10 mg via INTRAVENOUS
  Administered 2015-09-29: 15 mg via INTRAVENOUS

## 2015-09-29 MED ORDER — MAGNESIUM CITRATE PO SOLN
1.0000 | Freq: Once | ORAL | Status: DC | PRN
  Filled 2015-09-29: qty 296

## 2015-09-29 MED ORDER — BACLOFEN 10 MG PO TABS: 10.0000 mg | ORAL_TABLET | Freq: Three times a day (TID) | ORAL | Status: DC

## 2015-09-29 MED ORDER — SAW PALMETTO (SERENOA REPENS) 160 MG PO CAPS: 160.0000 mg | ORAL_CAPSULE | Freq: Three times a day (TID) | ORAL | Status: DC

## 2015-09-29 MED ORDER — MIDAZOLAM HCL 5 MG/5ML IJ SOLN
INTRAMUSCULAR | Status: DC | PRN
  Administered 2015-09-29: 1 mg via INTRAVENOUS

## 2015-09-29 SURGICAL SUPPLY — 63 items
BIT DRILL 5/64X5 DISP (BIT) ×3 IMPLANT
BLADE SAW SGTL MED 73X18.5 STR (BLADE) ×3 IMPLANT
BRUSH FEMORAL CANAL (MISCELLANEOUS) IMPLANT
CAPT SHLDR TOTAL 2 ×3 IMPLANT
CEMENT BONE DEPUY (Cement) ×3 IMPLANT
CLOSURE STERI-STRIP 1/2X4 (GAUZE/BANDAGES/DRESSINGS) ×1
CLSR STERI-STRIP ANTIMIC 1/2X4 (GAUZE/BANDAGES/DRESSINGS) ×2 IMPLANT
COVER SURGICAL LIGHT HANDLE (MISCELLANEOUS) ×3 IMPLANT
COVER TABLE BACK 60X90 (DRAPES) IMPLANT
DRAPE ORTHO SPLIT 77X108 STRL (DRAPES) ×4
DRAPE PROXIMA HALF (DRAPES) ×3 IMPLANT
DRAPE SURG ORHT 6 SPLT 77X108 (DRAPES) ×2 IMPLANT
DRAPE U-SHAPE 47X51 STRL (DRAPES) ×3 IMPLANT
DRSG MEPILEX BORDER 4X8 (GAUZE/BANDAGES/DRESSINGS) ×3 IMPLANT
DURAPREP 26ML APPLICATOR (WOUND CARE) ×3 IMPLANT
ELECT REM PT RETURN 9FT ADLT (ELECTROSURGICAL) ×3
ELECTRODE REM PT RTRN 9FT ADLT (ELECTROSURGICAL) ×1 IMPLANT
EVACUATOR 1/8 PVC DRAIN (DRAIN) IMPLANT
FACESHIELD WRAPAROUND (MASK) ×3 IMPLANT
GLOVE BIOGEL PI IND STRL 8 (GLOVE) ×1 IMPLANT
GLOVE BIOGEL PI INDICATOR 8 (GLOVE) ×2
GLOVE BIOGEL PI ORTHO PRO SZ8 (GLOVE) ×2
GLOVE ORTHO TXT STRL SZ7.5 (GLOVE) ×3 IMPLANT
GLOVE PI ORTHO PRO STRL SZ8 (GLOVE) ×1 IMPLANT
GLOVE SURG ORTHO 8.0 STRL STRW (GLOVE) ×6 IMPLANT
GOWN STRL REUS W/ TWL LRG LVL3 (GOWN DISPOSABLE) ×1 IMPLANT
GOWN STRL REUS W/ TWL XL LVL3 (GOWN DISPOSABLE) ×1 IMPLANT
GOWN STRL REUS W/TWL 2XL LVL3 (GOWN DISPOSABLE) ×3 IMPLANT
GOWN STRL REUS W/TWL LRG LVL3 (GOWN DISPOSABLE) ×2
GOWN STRL REUS W/TWL XL LVL3 (GOWN DISPOSABLE) ×2
HANDPIECE INTERPULSE COAX TIP (DISPOSABLE) ×2
HOOD PEEL AWAY FACE SHEILD DIS (HOOD) ×3 IMPLANT
KIT BASIN OR (CUSTOM PROCEDURE TRAY) ×3 IMPLANT
KIT ROOM TURNOVER OR (KITS) ×3 IMPLANT
MANIFOLD NEPTUNE II (INSTRUMENTS) ×3 IMPLANT
NEEDLE 1/2 CIR CATGUT .05X1.09 (NEEDLE) IMPLANT
NEEDLE HYPO 25GX1X1/2 BEV (NEEDLE) IMPLANT
NS IRRIG 1000ML POUR BTL (IV SOLUTION) ×3 IMPLANT
PACK SHOULDER (CUSTOM PROCEDURE TRAY) ×3 IMPLANT
PAD ARMBOARD 7.5X6 YLW CONV (MISCELLANEOUS) ×6 IMPLANT
PIN HUMERAL STMN 3.2MMX9IN (INSTRUMENTS) ×3 IMPLANT
PIN STEINMANN THREADED TIP (PIN) ×3 IMPLANT
SET HNDPC FAN SPRY TIP SCT (DISPOSABLE) ×1 IMPLANT
SLING ARM IMMOBILIZER LRG (SOFTGOODS) IMPLANT
SLING ARM IMMOBILIZER MED (SOFTGOODS) IMPLANT
SMARTMIX MINI TOWER (MISCELLANEOUS) ×3
SPONGE LAP 18X18 X RAY DECT (DISPOSABLE) ×3 IMPLANT
SUCTION FRAZIER TIP 10 FR DISP (SUCTIONS) ×3 IMPLANT
SUPPORT WRAP ARM LG (MISCELLANEOUS) ×3 IMPLANT
SUT FIBERWIRE #2 38 REV NDL BL (SUTURE)
SUT MAXBRAID (SUTURE) IMPLANT
SUT MNCRL AB 4-0 PS2 18 (SUTURE) IMPLANT
SUT VIC AB 0 CT1 27 (SUTURE) ×2
SUT VIC AB 0 CT1 27XBRD ANBCTR (SUTURE) ×1 IMPLANT
SUT VIC AB 2-0 CT1 27 (SUTURE)
SUT VIC AB 2-0 CT1 TAPERPNT 27 (SUTURE) IMPLANT
SUT VIC AB 3-0 SH 8-18 (SUTURE) ×3 IMPLANT
SUTURE FIBERWR#2 38 REV NDL BL (SUTURE) IMPLANT
SYR CONTROL 10ML LL (SYRINGE) IMPLANT
TOWEL OR 17X24 6PK STRL BLUE (TOWEL DISPOSABLE) ×3 IMPLANT
TOWEL OR 17X26 10 PK STRL BLUE (TOWEL DISPOSABLE) ×3 IMPLANT
TOWER SMARTMIX MINI (MISCELLANEOUS) ×1 IMPLANT
WATER STERILE IRR 1000ML POUR (IV SOLUTION) ×3 IMPLANT

## 2015-09-29 NOTE — Anesthesia Preprocedure Evaluation (Addendum)
Anesthesia Evaluation  Patient identified by MRN, date of birth, ID band Patient awake    Reviewed: Allergy & Precautions, NPO status , Patient's Chart, lab work & pertinent test results, reviewed documented beta blocker date and time   History of Anesthesia Complications (+) PONV and history of anesthetic complications  Airway Mallampati: II  TM Distance: >3 FB Neck ROM: Full    Dental  (+) Teeth Intact, Dental Advisory Given   Pulmonary shortness of breath and with exertion, COPD, former smoker,    Pulmonary exam normal breath sounds clear to auscultation       Cardiovascular hypertension, Pt. on medications and Pt. on home beta blockers (-) angina+ CAD  Normal cardiovascular exam Rhythm:Regular Rate:Normal     Neuro/Psych negative neurological ROS  negative psych ROS   GI/Hepatic negative GI ROS, Neg liver ROS,   Endo/Other  Hypothyroidism   Renal/GU negative Renal ROS     Musculoskeletal  (+) Arthritis , Osteoarthritis,    Abdominal   Peds  Hematology negative hematology ROS (+)   Anesthesia Other Findings Pt says crown feels loose on top left. Many caps and crowns.  Reproductive/Obstetrics negative OB ROS                           Anesthesia Physical Anesthesia Plan  ASA: III  Anesthesia Plan: General and Regional   Post-op Pain Management: GA combined w/ Regional for post-op pain   Induction: Intravenous  Airway Management Planned: Oral ETT  Additional Equipment:   Intra-op Plan:   Post-operative Plan: Extubation in OR  Informed Consent: I have reviewed the patients History and Physical, chart, labs and discussed the procedure including the risks, benefits and alternatives for the proposed anesthesia with the patient or authorized representative who has indicated his/her understanding and acceptance.   Dental advisory given  Plan Discussed with: CRNA and  Anesthesiologist  Anesthesia Plan Comments: (Risks/benefits of general anesthesia discussed with patient including risk of damage to teeth, lips, gum, and tongue, nausea/vomiting, allergic reactions to medications, and the possibility of heart attack, stroke and death.  All patient questions answered.  Patient wishes to proceed.  Discussed risks and benefits of interscalene block including failure, bleeding, infection, nerve damage, weakness, shortness of breath, pneumothorax. Questions answered. Patient consents to block. )        Anesthesia Quick Evaluation 09/22/15 Nuclear stress test: -The left ventricular ejection fraction is normal (55-65%). - Nuclear stress EF: 65%. - ST segment depression was noted during stress in the II, III, aVF, V5 and V6 leads. - This is a low risk study. Low risk stress nuclear study with ECG changes; small, moderate intensity, reversible inferior basal defect consistent with mild inferior ischemia; EF 65 with normal wall motion.  Based on stress test results, Dr. Johnsie Cancel recommended a LHC. 09/25/15 LHC (Dr. Angelena Form):  - Prox RCA lesion, 20% stenosed. - Mid RCA lesion, 20% stenosed. - Dist LAD lesion, 20% stenosed. - The left ventricular systolic function is normal. - 1st Mrg lesion, 60% stenosed. The vessel is small to moderate in size. The stenosis is eccentric and does not appear to be flow limiting. - Recommendations: He has a moderate eccentric stenosis in the first OM branch. This is a small to medium sized vessel. The lesion does not appear to be flow limiting. With upcoming surgery, we have discussed medical management of his CAD for now. He would prefer to attempt medical management for now. Will proceed with  surgery next week. If he has chest pain c/w angina going forward, could bring him back for re-look cath and PCI of the OM which would be technically easy to do.

## 2015-09-29 NOTE — Op Note (Signed)
09/29/2015  9:53 AM  PATIENT:  Jay Bradshaw    PRE-OPERATIVE DIAGNOSIS:  DJD LT SHOULDER  POST-OPERATIVE DIAGNOSIS:  Same  PROCEDURE:  TOTAL LEFT SHOULDER ARTHROPLASTY  SURGEON:  Johnny Bridge, MD  PHYSICIAN ASSISTANT: Joya Gaskins, OPA-C, present and scrubbed throughout the case, critical for completion in a timely fashion, and for retraction, instrumentation, and closure.  ANESTHESIA:   General  PREOPERATIVE INDICATIONS:  Jay Bradshaw is a  80 y.o. male with a diagnosis of DJD LT SHOULDER who failed conservative measures and elected for surgical management.    The risks benefits and alternatives were discussed with the patient preoperatively including but not limited to the risks of infection, bleeding, nerve injury, cardiopulmonary complications, the need for revision surgery, dislocation, loosening, incomplete relief of pain, among others, and the patient was willing to proceed.   OPERATIVE IMPLANTS: Biomet size 15 mini press-fit humeral stem, size 46+21 Versa-dial humeral head, set in the E position with increased coverage superiorly, with a medium cemented glenoid polyethylene 3 peg implant with a central regenerex noncemented post.   OPERATIVE FINDINGS: Advanced glenohumeral osteoarthritis involving the glenoid and the humeral head with substantial osteophyte formation inferiorly. There was severe flattening of the glenoid with severe posterior wear. There was a moderate amount of bone loss, but I was able to achieve  the central regenerex post and all 3 peripheral posts contained within the vault. There was a fair amount of redundancy to the subscapularis, and repair was quite easily obtained without significant tension. The humeral head translated quite easily to 50% posteriorly after all implants were in.  OPERATIVE PROCEDURE: The patient was brought to the operating room and placed in the supine position. General anesthesia was administered. IV antibiotics were given.  The  upper extremity was prepped and draped in usual sterile fashion. The patient was in a beachchair position with all bony prominences padded.  A Foley was placed given his previous urinary retention issues.  Time out was performed and a deltopectoral approach was carried out. The biceps tendon was tenodesed to the pectoralis tendon. The subscapularis was released, tagging it with a #2 MaxBraid, leaving a cuff of tendon for repair.   The inferior osteophyte was removed, and release of the capsule off of the humeral side was completed. The head was dislocated, and I reamed sequentially. I placed the humeral cutting guide at 30 of retroversion, and then pinned this into place, and made my humeral neck cut. This was at the appropriate level.   I then placed deep retractors and exposed the glenoid. I excised the labrum circumferentially, taking care to protect the axillary nerve inferiorly.   I then placed a guidewire into the center position, controlling appropriate version and inclination. I then reamed over the guidewire with the small reamer, and was satisfied with the preparation. I preserved the subchondral bone in order to maximize the strength and minimize the risk for subsequent subsidence.   I then drilled the central hole for the regenerex peg, and then placed the guide, and then drilled the 3 peripheral peg holes. I had excellent bony circumferential contact.  The posterior hole was quite difficult to drill, and I suspect that I was just at the cortex, but I was deep enough to get full seating of the real implant.  I then cleaned the glenoid, irrigated it copiously, and then dried it and cemented the prosthesis into place. Excellent seating was achieved. I had full exposure. The cement cured, and while I turned my attention  to the humeral side.   I sequentially broached, up to the selected size, with the broach set at 30 of retroversion. I then placed the real stem. I trialed with multiple  heads, and the above-named component was selected. Increased posterior coverage improved the coverage. The soft tissue tension was appropriate.   I then impacted the real humeral head into place, reduced the head, and irrigated copiously. Excellent stability and range of motion was achieved. I repaired the subscapularis with 4 #2 MaxBraid, as well as the rotator interval, and irrigated copiously once more. The subcutaneous tissue was closed with Vicryl including the deltopectoral fascia.   The skin was closed with Steri-Strips and sterile gauze was applied. He had a preoperative nerve block. He tolerated the procedure well and there were no complications.

## 2015-09-29 NOTE — Transfer of Care (Signed)
Immediate Anesthesia Transfer of Care Note  Patient: Collins Kerby  Procedure(s) Performed: Procedure(s): TOTAL LEFT SHOULDER ARTHROPLASTY (Left)  Patient Location: PACU  Anesthesia Type:General and Regional  Level of Consciousness: awake, alert  and oriented  Airway & Oxygen Therapy: Patient Spontanous Breathing and Patient connected to nasal cannula oxygen  Post-op Assessment: Report given to RN and Post -op Vital signs reviewed and stable  Post vital signs: Reviewed and stable  Last Vitals:  Filed Vitals:   09/29/15 0622  BP: 115/68  Pulse: 63  Temp: 36 C  Resp: 16    Complications: No apparent anesthesia complications

## 2015-09-29 NOTE — Discharge Instructions (Signed)

## 2015-09-29 NOTE — Anesthesia Postprocedure Evaluation (Signed)
  Anesthesia Post-op Note  Patient: Jay Bradshaw  Procedure(s) Performed: Procedure(s): TOTAL LEFT SHOULDER ARTHROPLASTY (Left)  Patient Location: PACU  Anesthesia Type:GA combined with regional for post-op pain  Level of Consciousness: awake, alert , oriented and patient cooperative  Airway and Oxygen Therapy: Patient connected to nasal cannula oxygen  Post-op Pain: mild  Post-op Assessment: Post-op Vital signs reviewed, Patient's Cardiovascular Status Stable, Respiratory Function Stable, Patent Airway, No signs of Nausea or vomiting and Pain level controlled              Post-op Vital Signs: Reviewed and stable  Last Vitals:  Filed Vitals:   09/29/15 1200  BP: 123/72  Pulse: 65  Temp:   Resp: 14    Complications: No apparent anesthesia complications

## 2015-09-29 NOTE — H&P (Signed)
PREOPERATIVE H&P  Chief Complaint: DJD LT SHOULDER  HPI: Jay Bradshaw is a 80 y.o. male who presents for preoperative history and physical with a diagnosis of DJD LT SHOULDER. Symptoms are rated as moderate to severe, and have been worsening.  This is significantly impairing activities of daily living.  He has elected for surgical management.   He has failed injections, activity modification, anti-inflammatories, and assistive devices.  Preoperative X-rays demonstrate end stage degenerative changes with osteophyte formation, loss of joint space, subchondral sclerosis.  He had his other side done and has done well except he had urinary retention post-op.  Past Medical History  Diagnosis Date  . Hyperlipidemia     takes Lovastatin daily  . Arthritis   . COPD (chronic obstructive pulmonary disease) ([place])   . Shortness of breath dyspnea   . Tremors of nervous system   . Osteoarthritis of right shoulder, primary 12/16/2014  . Complication of anesthesia   . PONV (postoperative nausea and vomiting)   . Hypothyroidism     takes Synthroid daily  . Constipation     takes Colace daily as needed  . Urinary frequency     takes Ditropan daily  . Enlarged prostate     takes Tamsulosin daily  . Coronary artery disease     takes Metoprolol daily  . Emphysema lung ([place])   . Productive cough   . Balance problem   . Joint pain    Past Surgical History  Procedure Laterality Date  . Cataract extraction  Aug and Sep 2011    first left eye, then right eye  . Colonoscopy  June 2008    Dr. Lurena Joiner ref by [facility] Cancer  . Irrigation and debridement sebaceous cyst  2010    from the back, [employer]  . Prostate biopsy  2006    Dr. Nadara Mustard [place]  . Lipoma excision  Nov 2004    from neck, right side (near location of mandible cyst), Dr Lorrine Kin ref by [place]  . Excision of fibroma  October 1994    in skin on back of neck right of midline, Dede Query ref by Lidia Collum  . Vacuolar cyst  removal      in right mandible fixed/filled, Denyce Robert ref by Synetta Shadow, DDS  . Thyroid adenoma removal  August 1970    right side, [facility]  . Hernia repair    . Shoulder surgery Right 12/16/2014    hemi-arthroplasty    dr Mardelle Matte  . Total shoulder arthroplasty Right 12/16/2014    Procedure: RIGHT TOTAL SHOULDER ARTHROPLASTY;  Surgeon: Johnny Bridge, MD;  Location: [facility];  Service: Orthopedics;  Laterality: Right;  . Shoulder hemi-arthroplasty Right 12/16/2014    Procedure: SHOULDER HEMI-ARTHROPLASTY;  Surgeon: Johnny Bridge, MD;  Location: [facility];  Service: Orthopedics;  Laterality: Right;  . Wisdom teeth extracted    . Cardiac catheterization N/A 09/25/2015    Procedure: Left Heart Cath and Coronary Angiography;  Surgeon: Burnell Blanks, MD;  Location: [facility];  Service: Cardiovascular;  Laterality: N/A;   Social History   Social History  . Marital Status: Married    Spouse Name: N/A  . Number of Children: N/A  . Years of Education: N/A   Social History Main Topics  . Smoking status: Former Research scientist (life sciences)  . Smokeless tobacco: Never Used     Comment: quit smoking 12yrs ago  . Alcohol Use: Yes     Comment: weekly  . Drug Use: No  .  Sexual Activity: No   Other Topics Concern  . None   Social History Narrative   Family History  Problem Relation Age of Onset  . Hypertension Mother    No Known Allergies Prior to Admission medications   Medication Sig Start Date End Date Taking? Authorizing Provider  alendronate (FOSAMAX) 70 MG tablet Take 70 mg by mouth once a week. Take with a full glass of water on an empty stomach.   Yes Historical Provider, MD  aspirin 81 MG tablet Take 81 mg by mouth daily.   Yes Historical Provider, MD  CALCIUM-MAGNESIUM PO Take 500-600 mg by mouth 2 (two) times daily.   Yes Historical Provider, MD  cholecalciferol (VITAMIN D) 400 UNITS TABS tablet Take 400 Units by mouth 2 (two) times daily.   Yes Historical  Provider, MD  Coenzyme Q10 (CO Q10) 100 MG CAPS Take 200 mg by mouth daily.   Yes Historical Provider, MD  docusate sodium (COLACE) 100 MG capsule Take 100 mg by mouth daily as needed for mild constipation.   Yes Historical Provider, MD  levothyroxine (SYNTHROID, LEVOTHROID) 88 MCG tablet Take 88 mcg by mouth daily before breakfast.   Yes Historical Provider, MD  lovastatin (MEVACOR) 20 MG tablet Take 20 mg by mouth at bedtime.   Yes Historical Provider, MD  Melatonin 3 MG TABS Take 1 tablet by mouth at bedtime as needed (sleep).   Yes Historical Provider, MD  metoprolol succinate (TOPROL-XL) 50 MG 24 hr tablet Take 50 mg by mouth at bedtime. Patient reports that he takes when he gets up in the middle of the night. Take with or immediately following a meal.   Yes Historical Provider, MD  Multiple Vitamin (MULTIVITAMIN) capsule Take 1 capsule by mouth every other day.    Yes Historical Provider, MD  Multiple Vitamins-Minerals (PRESERVISION AREDS 2) CAPS Take 1 capsule by mouth every other day.   Yes Historical Provider, MD  oxybutynin (DITROPAN) 5 MG tablet Take 1.25 mg by mouth 2 (two) times daily.    Yes Historical Provider, MD  PSYLLIUM HUSK PO Take 1 capsule by mouth 3 (three) times daily as needed (depending on meals he ate that day).    Yes Historical Provider, MD  saw palmetto 160 MG capsule Take 160 mg by mouth 3 (three) times daily.   Yes Historical Provider, MD  tamsulosin (FLOMAX) 0.4 MG CAPS capsule Take 0.4 mg by mouth daily.   Yes Historical Provider, MD  vitamin C (ASCORBIC ACID) 500 MG tablet Take 500 mg by mouth every other day.    Yes Historical Provider, MD  vitamin E (VITAMIN E) 400 UNIT capsule Take 300 Units by mouth every other day.    Yes Historical Provider, MD  nitroGLYCERIN (NITROSTAT) 0.4 MG SL tablet Place 0.4 mg under the tongue every 5 (five) minutes as needed for chest pain (up to 3 doses MAX).    Historical Provider, MD     Positive ROS: All other systems have been  reviewed and were otherwise negative with the exception of those mentioned in the HPI and as above.  Physical Exam: General: Alert, no acute distress Cardiovascular: No pedal edema Respiratory: No cyanosis, no use of accessory musculature GI: No organomegaly, abdomen is soft and non-tender Skin: No lesions in the area of chief complaint Neurologic: Sensation intact distally Psychiatric: Patient is competent for consent with normal mood and affect Lymphatic: No axillary or cervical lymphadenopathy  MUSCULOSKELETAL: left shoulder AROM 0-80 with 0 ER with crepitance.  Assessment: DJD LT SHOULDER   Plan: Plan for Procedure(s): TOTAL LEFT SHOULDER ARTHROPLASTY  The risks benefits and alternatives were discussed with the patient including but not limited to the risks of nonoperative treatment, versus surgical intervention including infection, bleeding, nerve injury,  blood clots, cardiopulmonary complications, morbidity, mortality, among others, and they were willing to proceed.   Will also plan for foley until 11/2.  Johnny Bridge, MD Cell [phone]   09/29/2015 7:23 AM

## 2015-09-29 NOTE — Anesthesia Procedure Notes (Addendum)
Anesthesia Regional Block:  Interscalene brachial plexus block  Pre-Anesthetic Checklist: ,, timeout performed, Correct Patient, Correct Site, Correct Laterality, Correct Procedure, Correct Position, site marked, Risks and benefits discussed,  Surgical consent,  Pre-op evaluation,  At surgeon's request and post-op pain management  Laterality: Left  Prep: chloraprep       Needles:  Injection technique: Single-shot  Needle Type: Echogenic Stimulator Needle     Needle Length: 5cm 5 cm Needle Gauge: 22 and 22 G    Additional Needles:  Procedures: ultrasound guided (picture in chart) Interscalene brachial plexus block Narrative:  Injection made incrementally with aspirations every 5 mL.  Performed by: Personally  Anesthesiologist: Catalina Gravel  Additional Notes: Functioning IV was confirmed and monitors were applied.  A 66mm 22ga Arrow echogenic stimulator needle was used. Sterile prep and drape,hand hygiene and sterile gloves were used.  Negative aspiration and negative test dose prior to incremental administration of local anesthetic. The patient tolerated the procedure well.  Ultrasound guidance: relevent anatomy identified, needle position confirmed, local anesthetic spread visualized around nerve(s), vascular puncture avoided.  Image printed for medical record.    Procedure Name: Intubation Date/Time: 09/29/2015 7:33 AM Performed by: Mariea Clonts Pre-anesthesia Checklist: Emergency Drugs available, Timeout performed, Suction available, Patient identified and Patient being monitored Patient Re-evaluated:Patient Re-evaluated prior to inductionOxygen Delivery Method: Circle system utilized Preoxygenation: Pre-oxygenation with 100% oxygen Intubation Type: IV induction Ventilation: Mask ventilation without difficulty Laryngoscope Size: Miller and 3 Grade View: Grade II Tube type: Oral Tube size: 7.5 mm Number of attempts: 1 Placement Confirmation: ETT inserted  through vocal cords under direct vision,  breath sounds checked- equal and bilateral and positive ETCO2 Tube secured with: Tape Dental Injury: Teeth and Oropharynx as per pre-operative assessment

## 2015-09-29 NOTE — Progress Notes (Signed)
Orthopedic Tech Progress Note Patient Details:  Jay Bradshaw 10-29-1935 [id number]  Ortho Devices Type of Ortho Device: Sling immobilizer   Jay Bradshaw 09/29/2015, 4:01 PM

## 2015-09-30 ENCOUNTER — Encounter (HOSPITAL_COMMUNITY): Payer: Self-pay | Admitting: Orthopedic Surgery

## 2015-09-30 LAB — BASIC METABOLIC PANEL
Anion gap: 10 (ref 5–15)
BUN: 14 mg/dL (ref 6–20)
CO2: 22 mmol/L (ref 22–32)
Calcium: 8.2 mg/dL — ABNORMAL LOW (ref 8.9–10.3)
Chloride: 100 mmol/L — ABNORMAL LOW (ref 101–111)
Creatinine, Ser: 0.93 mg/dL (ref 0.61–1.24)
GFR calc Af Amer: >60 mL/min (ref >60)
GLUCOSE: 117 mg/dL — AB (ref 65–99)
POTASSIUM: 4.5 mmol/L (ref 3.5–5.1)
Sodium: 132 mmol/L — ABNORMAL LOW (ref 135–145)

## 2015-09-30 LAB — CBC
HEMATOCRIT: 35.4 % — AB (ref 39.0–52.0)
Hemoglobin: 11.5 g/dL — ABNORMAL LOW (ref 13.0–17.0)
MCH: 29.3 pg (ref 26.0–34.0)
MCHC: 32.5 g/dL (ref 30.0–36.0)
MCV: 90.1 fL (ref 78.0–100.0)
PLATELETS: 205 10*3/uL (ref 150–400)
RBC: 3.93 MIL/uL — AB (ref 4.22–5.81)
RDW: 13.6 % (ref 11.5–15.5)
WBC: 7.5 10*3/uL (ref 4.0–10.5)

## 2015-09-30 MED ORDER — BETHANECHOL CHLORIDE 10 MG PO TABS
10.0000 mg | ORAL_TABLET | Freq: Three times a day (TID) | ORAL | Status: DC
  Administered 2015-09-30 – 2015-10-02 (×7): 10 mg via ORAL
  Filled 2015-09-30 (×9): qty 1

## 2015-09-30 NOTE — Progress Notes (Signed)
Patient ID: Jay Bradshaw, male   DOB: 1935/04/21, 81 y.o.   MRN: [id number]     Subjective:  Patient reports pain as mild.  Patient in bed and in no acute distress and would like to work with PT asap to be safe ambulating.  Objective:   VITALS:   Filed Vitals:   09/29/15 1444 09/29/15 2108 09/30/15 0121 09/30/15 0533  BP: 123/65 116/54 118/56 120/56  Pulse: 70 73 79 79  Temp: 97.9 F (36.6 C) 98.2 F (36.8 C) 98.6 F (37 C) 98.4 F (36.9 C)  TempSrc: Oral Oral Oral Oral  Resp: 16 15 16 18   Height: 6\' 2"  (1.88 m)     Weight: 80.75 kg (178 lb 0.3 oz)     SpO2: 100% 98% 94% 93%    ABD soft Sensation intact distally Dorsiflexion/Plantar flexion intact Incision: dressing C/D/I and no drainage Good hand and wrist motion  Foley cath. In place  Lab Results  Component Value Date   WBC 7.5 09/30/2015   HGB 11.5* 09/30/2015   HCT 35.4* 09/30/2015   MCV 90.1 09/30/2015   PLT 205 09/30/2015   BMET    Component Value Date/Time   NA 132* 09/30/2015 0435   K 4.5 09/30/2015 0435   CL 100* 09/30/2015 0435   CO2 22 09/30/2015 0435   GLUCOSE 117* 09/30/2015 0435   BUN 14 09/30/2015 0435   CREATININE 0.93 09/30/2015 0435   CALCIUM 8.2* 09/30/2015 0435   GFRNONAA >60 09/30/2015 0435   GFRAA >60 09/30/2015 0435     Assessment/Plan: 1 Day Post-Op   Principal Problem:   Osteoarthritis of left shoulder Active Problems:   S/P shoulder replacement   Advance diet Up with therapy Sling at all times  nwb left upper ext DC foley  Plan for DC home tomorrow   Remonia Richter 09/30/2015, 7:20 AM  Seen and agree with above.  Will change to bethanochol per urology.   Marchia Bond, MD Cell [phone]

## 2015-09-30 NOTE — Evaluation (Signed)
Physical Therapy Evaluation Patient Details Name: Jay Bradshaw MRN: [id number] DOB: 06-Aug-1935 Today's Date: 09/30/2015   History of Present Illness  80 y.o. male s/p Lt total shoulder arthroplasty. PMH: hyperlipdemia, arthritis, COPD, SOB, tremors and OA rt shoulder s/p R TSA in Jan 2016.  Clinical Impression  Pt is mildly unsteady on his feet, but I believe this will improve with increased mobility and decreased pain meds.  I did recommend he use a cane at discharge and we will attempt to see him before possible d/c tomorrow to try a cane and see if this makes him safer/more stable.   PT to follow acutely for deficits listed below.       Follow Up Recommendations Outpatient PT;Supervision for mobility/OOB    Equipment Recommendations  None recommended by PT    Recommendations for Other Services   NA    Precautions / Restrictions Precautions Precautions: Fall;Shoulder Shoulder Interventions: Shoulder sling/immobilizer;Off for dressing/bathing/exercises Required Braces or Orthoses: Sling Restrictions Weight Bearing Restrictions: Yes LUE Weight Bearing: Non weight bearing      Mobility  Bed Mobility Overal bed mobility: Needs Assistance Bed Mobility: Supine to Sit;Sit to Supine     Supine to sit: Supervision Sit to supine: Supervision   General bed mobility comments: Pt able to get to sitting EOB on the right side of the bed with supervision and got into the left side of bed and was able to go down to flat bed controlling descent with ab muscles and right elbow.   Transfers Overall transfer level: Needs assistance Equipment used: None Transfers: Sit to/from Stand Sit to Stand: Min guard         General transfer comment: Min guard assist for safety.   Ambulation/Gait Ambulation/Gait assistance: Min guard Ambulation Distance (Feet): 300 Feet Assistive device: None Gait Pattern/deviations: Step-through pattern;Staggering left;Staggering right     General Gait  Details: pt with mildly staggering gait pattern, had to stop when answering questions.  Pt reports he does feel a bit more unsteady than at baseline and is agreeable to using a cane if he needs to at home.          Balance Overall balance assessment: Needs assistance Sitting-balance support: Feet supported;No upper extremity supported Sitting balance-Leahy Scale: Good     Standing balance support: No upper extremity supported Standing balance-Leahy Scale: Fair                               Pertinent Vitals/Pain Pain Assessment: 0-10 Pain Score: 2  Pain Location: left shoulder Pain Descriptors / Indicators: Aching;Burning Pain Intervention(s): Limited activity within patient's tolerance;Monitored during session;Repositioned    Home Living Family/patient expects to be discharged to:: Private residence Living Arrangements: Spouse/significant other (wife walks with a cane) Available Help at Discharge: Family;Available 24 hours/day Type of Home: House Home Access: Ramped entrance     Home Layout: One level Home Equipment: Walker - 2 wheels;Bedside commode;Wheelchair - manual;Tub bench;Grab bars - tub/shower;Adaptive equipment Additional Comments: Wife present during session.    Prior Function Level of Independence: Independent         Comments: Pt reports no h/o      Hand Dominance   Dominant Hand: Right    Extremity/Trunk Assessment   Upper Extremity Assessment: Defer to OT evaluation           Lower Extremity Assessment: Overall WFL for tasks assessed (5/5 strength, normal sensation)      Cervical /  Trunk Assessment: Normal  Communication   Communication: No difficulties  Cognition Arousal/Alertness: Awake/alert Behavior During Therapy: WFL for tasks assessed/performed Overall Cognitive Status:  (pt easily flusterd, had difficulty multi tasking)                         Exercises Donning/doffing shirt without moving shoulder:  Minimal assistance Method for sponge bathing under operated UE: Minimal assistance Donning/doffing sling/immobilizer: Minimal assistance Correct positioning of sling/immobilizer: Independent Sling wearing schedule (on at all times/off for ADL's): Independent Proper positioning of operated UE when showering: Independent Positioning of UE while sleeping: Independent      Assessment/Plan    PT Assessment Patient needs continued PT services  PT Diagnosis Difficulty walking;Abnormality of gait;Generalized weakness;Acute pain   PT Problem List Decreased strength;Decreased activity tolerance;Decreased balance;Decreased mobility;Decreased knowledge of use of DME;Pain  PT Treatment Interventions DME instruction;Gait training;Functional mobility training;Therapeutic activities;Therapeutic exercise;Neuromuscular re-education;Balance training;Patient/family education;Modalities   PT Goals (Current goals can be found in the Care Plan section) Acute Rehab PT Goals Patient Stated Goal: to go home PT Goal Formulation: With patient Time For Goal Achievement: 10/07/15 Potential to Achieve Goals: Good    Frequency Min 5X/week   Barriers to discharge Decreased caregiver support wife is walking with a cane       End of Session Equipment Utilized During Treatment: Gait belt Activity Tolerance: Patient tolerated treatment well Patient left: in bed;with call bell/phone within reach;with bed alarm set Nurse Communication: Mobility status         Time: 0076-2263 PT Time Calculation (min) (ACUTE ONLY): 27 min   Charges:   PT Evaluation $Initial PT Evaluation Tier I: 1 Procedure PT Treatments $Gait Training: 8-22 mins        Siomara Burkel B. Brinn Westby, PT, DPT [phone]   09/30/2015, 6:00 PM

## 2015-09-30 NOTE — Care Management Note (Signed)
Case Management Note  Patient Details  Name: Brandell Maready MRN: [id number] Date of Birth: 04/23/1935  Subjective/Objective:                    Action/Plan:  Initial UR completed . Awaiting PT/OT notes  Expected Discharge Date:                  Expected Discharge Plan:     In-House Referral:     Discharge planning Services     Post Acute Care Choice:    Choice offered to:     DME Arranged:    DME Agency:     HH Arranged:    HH Agency:     Status of Service:  In process, will continue to follow  Medicare Important Message Given:    Date Medicare IM Given:    Medicare IM give by:    Date Additional Medicare IM Given:    Additional Medicare Important Message give by:     If discussed at [place] of Stay Meetings, dates discussed:    Additional Comments:  Marilu Favre, RN 09/30/2015, 8:43 AM

## 2015-09-30 NOTE — Progress Notes (Signed)
Paged Ortho tech for trapeze overhead frame for patient but no response yet.

## 2015-09-30 NOTE — Progress Notes (Signed)
Orthopedic Tech Progress Note Patient Details:  Jay Bradshaw 1934-11-29 [id number]  Ortho Devices Type of Ortho Device: Arm sling Ortho Device/Splint Location: lue Ortho Device/Splint Interventions: Application   Hildred Priest 09/30/2015, 4:37 PM

## 2015-09-30 NOTE — Evaluation (Signed)
Occupational Therapy Evaluation Patient Details Name: Jay Bradshaw MRN: [id number] DOB: 04/23/1935 Today's Date: 09/30/2015    History of Present Illness 80 y.o. male s/p Lt total shoulder arthroplasty. PMH: hyperlipdemia, arthritis, COPD, SOB, tremors and OA rt shoulder   Clinical Impression   Pt admitted to hospital due to reason stated above. Pt currently with functional limitiations due to the deficits listed below (see OT problem list). Prior to admission pt was independent with ADLs. Pt currently requires supervision to minimal assistance for safety with ADLs. Pt wife was in recent accident and needs pt to be at modified independent level with ADLs when returning home. Pt will benefit from skilled OT to increase his independence and safety with ADLs and balance to allow safe discharge home.    Follow Up Recommendations  Outpatient OT (when applicable)    Equipment Recommendations  None recommended by OT    Recommendations for Other Services       Precautions / Restrictions Precautions Precautions: Fall;Shoulder Shoulder Interventions: Shoulder sling/immobilizer;Off for dressing/bathing/exercises Precaution Booklet Issued: Yes (comment) Precaution Comments: reviewed shoulder precautions and NWB status Required Braces or Orthoses: Sling Restrictions Weight Bearing Restrictions: Yes LUE Weight Bearing: Non weight bearing      Mobility Bed Mobility Overal bed mobility: Needs Assistance Bed Mobility: Supine to Sit     Supine to sit: Min assist     General bed mobility comments: Min assist to bring trunk into upright position  Transfers Overall transfer level: Needs assistance Equipment used: 1 person hand held assist Transfers: Sit to/from Stand Sit to Stand: Min assist         General transfer comment: Pt requires increase time and hand held assist used to complete transfer.     Balance Overall balance assessment: Needs assistance Sitting-balance support:  No upper extremity supported;Feet supported Sitting balance-Leahy Scale: Fair     Standing balance support: Single extremity supported;During functional activity Standing balance-Leahy Scale: Poor Standing balance comment: required UE support while performing groom task at sink                            ADL Overall ADL's : Needs assistance/impaired     Grooming: Oral care;Min guard;Standing     Upper Body Bathing Details (indicate cue type and reason): educated pt and pt's wife in seesaw method for cleaning Lt UE and completing bathing task in seated position for increase safety       Upper Body Dressing Details (indicate cue type and reason): educated pt and pt's wife in one handed technique for dressing and how to properly don Lt UE first. Pt educated in donning and doffing of sling.   Lower Body Dressing Details (indicate cue type and reason): pt educated sitting while donning undergarments and pants and to stand only when applicable to increase pt's safety with LB dressing Toilet Transfer: Min guard;Ambulation;Comfort height toilet   Toileting- Clothing Manipulation and Hygiene: Supervision/safety;Sitting/lateral lean     Tub/Shower Transfer Details (indicate cue type and reason): pt reports wife has tub transfer bench at home, recommended pt utilizt tub bench to increase safety and decrease pt's risk of falling Functional mobility during ADLs: Min guard General ADL Comments: Pt reports wife has adaptive equipment he can utilize to assist with ADLs.     Vision     Perception     Praxis      Pertinent Vitals/Pain Pain Assessment: 0-10 Pain Score: 2  Pain Location: Lt shoulder Pain  Descriptors / Indicators: Pressure Pain Intervention(s): Monitored during session;Repositioned;Ice applied     Hand Dominance Right   Extremity/Trunk Assessment Upper Extremity Assessment Upper Extremity Assessment: LUE deficits/detail LUE Deficits / Details: recent Lt  TSA LUE: Unable to fully assess due to immobilization LUE Coordination: decreased gross motor   Lower Extremity Assessment Lower Extremity Assessment: Defer to PT evaluation   Cervical / Trunk Assessment Cervical / Trunk Assessment: Normal   Communication Communication Communication: No difficulties   Cognition Arousal/Alertness: Awake/alert Behavior During Therapy: WFL for tasks assessed/performed Overall Cognitive Status: Within Functional Limits for tasks assessed                     General Comments       Exercises Exercises: Shoulder     Shoulder Instructions Shoulder Instructions Donning/doffing shirt without moving shoulder: Minimal assistance Method for sponge bathing under operated UE: Minimal assistance Donning/doffing sling/immobilizer: Minimal assistance Correct positioning of sling/immobilizer: Independent Sling wearing schedule (on at all times/off for ADL's): Independent Proper positioning of operated UE when showering: Independent Positioning of UE while sleeping: [place] expects to be discharged to:: Private residence Living Arrangements: Spouse/significant other Available Help at Discharge: Family;Available 24 hours/day Type of Home: House Home Access: Ramped entrance     Home Layout: One level     Bathroom Shower/Tub: Tub/shower unit Shower/tub characteristics: Curtain Bathroom Toilet: Handicapped height     Home Equipment: Environmental consultant - 2 wheels;Bedside commode;Wheelchair - manual;Tub bench;Grab bars - tub/shower;Adaptive equipment Adaptive Equipment: Reacher;Sock aid;Long-handled shoe horn;Long-handled sponge Additional Comments: Wife present during session.      Prior Functioning/Environment Level of Independence: Independent             OT Diagnosis: Generalized weakness;Acute pain   OT Problem List: Decreased strength;Decreased range of motion;Impaired balance (sitting and/or  standing);Decreased knowledge of use of DME or AE;Pain   OT Treatment/Interventions: Self-care/ADL training;Therapeutic exercise;DME and/or AE instruction;Therapeutic activities;Patient/family education;Balance training    OT Goals(Current goals can be found in the care plan section) Acute Rehab OT Goals Patient Stated Goal: none stated OT Goal Formulation: With patient Time For Goal Achievement: 10/14/15 Potential to Achieve Goals: Good ADL Goals Pt Will Perform Grooming: with modified independence;standing Pt Will Perform Upper Body Bathing: with caregiver independent in assisting;sitting Pt Will Perform Lower Body Bathing: with min assist;with adaptive equipment;sitting/lateral leans Pt Will Perform Upper Body Dressing: with supervision;sitting Pt Will Perform Lower Body Dressing: with min assist;with adaptive equipment;sit to/from stand Pt Will Perform Tub/Shower Transfer: Tub transfer;with supervision;ambulating;tub bench Additional ADL Goal #1: Pt will be independent with donning and doffing sling  OT Frequency: Min 2X/week   Barriers to D/C:            Co-evaluation              End of Session Equipment Utilized During Treatment: Gait belt;Other (comment) (sling)  Activity Tolerance: Patient tolerated treatment well Patient left: in bed;with call bell/phone within reach;with family/visitor present   Time: 3710-6269 OT Time Calculation (min): 40 min Charges:  OT General Charges $OT Visit: 1 Procedure OT Evaluation $Initial OT Evaluation Tier I: 1 Procedure OT Treatments $Self Care/Home Management : 23-37 mins G-Codes:    Lin Landsman 10/12/15, 3:29 PM

## 2015-10-01 MED ORDER — BETHANECHOL CHLORIDE 10 MG PO TABS: 10.0000 mg | ORAL_TABLET | Freq: Three times a day (TID) | ORAL | Status: DC

## 2015-10-01 MED ORDER — HYDROCODONE-ACETAMINOPHEN 10-325 MG PO TABS: 1.0000 | ORAL_TABLET | Freq: Four times a day (QID) | ORAL | Status: DC | PRN

## 2015-10-01 NOTE — Care Management Note (Signed)
Case Management Note  Patient Details  Name: Jay Bradshaw MRN: [id number] Date of Birth: 03-Sep-1935  Subjective/Objective:                    Action/Plan:  PT recommending outpatient PT , OT recommending  OP OT when applicable .  Referral form for Outpatient PT and outpatient OT on shadow chart . MD if you would like outpatient therapy set up before discharge from hosptial please sign referral form and call case manager . Thanks Magdalen Spatz RN BSN [phone]  Expected Discharge Date:                  Expected Discharge Plan:     In-House Referral:     Discharge planning Services     Post Acute Care Choice:    Choice offered to:     DME Arranged:    DME Agency:     HH Arranged:    HH Agency:     Status of Service:  In process, will continue to follow  Medicare Important Message Given:    Date Medicare IM Given:    Medicare IM give by:    Date Additional Medicare IM Given:    Additional Medicare Important Message give by:     If discussed at [place] Meetings, dates discussed:    Additional Comments:  Marilu Favre, RN 10/01/2015, 9:37 AM

## 2015-10-01 NOTE — Progress Notes (Signed)
Physical Therapy Treatment Patient Details Name: Jay Bradshaw MRN: [id number] DOB: 08-Nov-1935 Today's Date: 10/01/2015    History of Present Illness 80 y.o. male s/p Lt total shoulder arthroplasty. PMH: hyperlipdemia, arthritis, COPD, SOB, tremors and OA rt shoulder s/p R TSA in Jan 2016.    PT Comments    Gait trial with cane today. Pt apprehensive about using it on level surfaces feeling like it gets more in his way.  Discussed safety and use of cane on unlevel surfaces.  Pt is hoping to d/c home today.  Follow Up Recommendations  Outpatient PT;Supervision for mobility/OOB     Equipment Recommendations  None recommended by PT    Recommendations for Other Services       Precautions / Restrictions Precautions Precautions: Fall;Shoulder Shoulder Interventions: Shoulder sling/immobilizer;Off for dressing/bathing/exercises Required Braces or Orthoses: Sling Restrictions LUE Weight Bearing: Non weight bearing    Mobility  Bed Mobility               General bed mobility comments: Pt up in recliner upon arrival  Transfers Overall transfer level: Needs assistance Equipment used: None Transfers: Sit to/from Stand Sit to Stand: Supervision            Ambulation/Gait Ambulation/Gait assistance: Min guard;Supervision Ambulation Distance (Feet): 350 Feet Assistive device: Straight cane;None Gait Pattern/deviations: Step-through pattern;Staggering left     General Gait Details: Occasional stagger even with cane, but also tends to carry cane and needed cues to use it.  Pt feels that cane will be more in his way on level surfaces, but was open to it on unlevel surfaces.   Stairs            Wheelchair Mobility    Modified Rankin (Stroke Patients Only)       Balance Overall balance assessment: Needs assistance Sitting-balance support: No upper extremity supported Sitting balance-Leahy Scale: Good     Standing balance support: No upper extremity  supported Standing balance-Leahy Scale: Fair Standing balance comment: essential tremor noted                    Cognition Arousal/Alertness: Awake/alert Behavior During Therapy: WFL for tasks assessed/performed Overall Cognitive Status: Within Functional Limits for tasks assessed                      Exercises      General Comments        Pertinent Vitals/Pain Pain Assessment: 0-10 Pain Score: 3  Pain Location: L shoulder Pain Descriptors / Indicators: Aching Pain Intervention(s): Monitored during session    Home Living                      Prior Function            PT Goals (current goals can now be found in the care plan section) Acute Rehab PT Goals Patient Stated Goal: to go home PT Goal Formulation: With patient Time For Goal Achievement: 10/07/15 Potential to Achieve Goals: Good Progress towards PT goals: Progressing toward goals    Frequency  Min 5X/week    PT Plan Current plan remains appropriate    Co-evaluation             End of Session Equipment Utilized During Treatment: Gait belt Activity Tolerance: Patient tolerated treatment well Patient left: in chair;with call bell/phone within reach;with family/visitor present     Time: 2505-3976 PT Time Calculation (min) (ACUTE ONLY): 18 min  Charges:  $Gait Training:  8-22 mins                    G Codes:      Lamorris Knoblock LUBECK 10/01/2015, 1:33 PM

## 2015-10-01 NOTE — Progress Notes (Signed)
Patient ID: Jay Bradshaw, male   DOB: 1934/12/30, 80 y.o.   MRN: [id number]     Subjective:  Patient reports pain as mild.  Patient still having trouble with voiding bladder percocet giving altered mental status   Objective:   VITALS:   Filed Vitals:   09/30/15 1318 09/30/15 1630 09/30/15 2145 10/01/15 0452  BP: 130/65 142/68 142/73 153/73  Pulse: 73 75 102 78  Temp: 98.4 F (36.9 C)  98.4 F (36.9 C) 98 F (36.7 C)  TempSrc: Oral  Oral Oral  Resp: 18  18 18   Height:      Weight:      SpO2: 95% 96% 92% 100%    ABD soft Sensation intact distally Dorsiflexion/Plantar flexion intact Incision: dressing C/D/I and no drainage Good hand and wrist motion  Good finger motion  Lab Results  Component Value Date   WBC 7.5 09/30/2015   HGB 11.5* 09/30/2015   HCT 35.4* 09/30/2015   MCV 90.1 09/30/2015   PLT 205 09/30/2015   BMET    Component Value Date/Time   NA 132* 09/30/2015 0435   K 4.5 09/30/2015 0435   CL 100* 09/30/2015 0435   CO2 22 09/30/2015 0435   GLUCOSE 117* 09/30/2015 0435   BUN 14 09/30/2015 0435   CREATININE 0.93 09/30/2015 0435   CALCIUM 8.2* 09/30/2015 0435   GFRNONAA >60 09/30/2015 0435   GFRAA >60 09/30/2015 0435     Assessment/Plan: 2 Days Post-Op   Principal Problem:   Osteoarthritis of left shoulder Active Problems:   S/P shoulder replacement   Advance diet Up with therapy Plan for discharge tomorrow Continue to monitor the bladder Okay for plain Tylenol for pain Sling at all times    DOUGLAS Joya Gaskins 10/01/2015, 7:06 AM  Seen and agree with above.  Will monitor bladder and bowel fxn overnight, and dc home tomorrow if ok.  Change to hydrocodone.  Bowel meds prn.  Outpatient bethanechol and hydrocodone.   Not much for PT/OT to do as outpatient until 6 weeks.  Will make referral then from office during followup.   Marchia Bond, MD Cell [phone]

## 2015-10-01 NOTE — Progress Notes (Signed)
Occupational Therapy Treatment Patient Details Name: Jay Bradshaw MRN: [id number] DOB: Sep 20, 1935 Today's Date: 10/01/2015    History of present illness 80 y.o. male s/p Lt total shoulder arthroplasty. PMH: hyperlipdemia, arthritis, COPD, SOB, tremors and OA rt shoulder s/p R TSA in Jan 2016.   OT comments  Focus of session included grooming and education regarding shoulder protocol for ADL safety. Pt able to verbalize sling wearing schedule, LUE positioning for sleeping, technique for bathing and dressing using teach back method. Pt able to demonstrate donning/doffing of sling requiring min A for completion. Overall, pt making progress towards OT goals, will continue to follow.  Follow Up Recommendations  Outpatient OT    Equipment Recommendations  None recommended by OT    Recommendations for Other Services      Precautions / Restrictions Precautions Precautions: Fall;Shoulder Shoulder Interventions: Shoulder sling/immobilizer;Off for dressing/bathing/exercises Precaution Booklet Issued: Yes (comment) Precaution Comments: reviewed shoulder precautions and NWB status Required Braces or Orthoses: Sling Restrictions Weight Bearing Restrictions: Yes LUE Weight Bearing: Non weight bearing       Mobility Bed Mobility Overal bed mobility: Needs Assistance Bed Mobility: Supine to Sit     Supine to sit: Supervision        Transfers Overall transfer level: Needs assistance Equipment used: None Transfers: Sit to/from Stand Sit to Stand: Supervision              Balance Overall balance assessment: Needs assistance Sitting-balance support: No upper extremity supported;Feet supported Sitting balance-Leahy Scale: Good     Standing balance support: No upper extremity supported;During functional activity Standing balance-Leahy Scale: Fair Standing balance comment: Pt able to stand at sink without UE support to perform grooming task                   ADL       Grooming: Oral care;Supervision/safety;Standing                               Functional mobility during ADLs: Supervision/safety General ADL Comments: Reviewed One hand technique for donning/doffing UE clothing, pt decline demonstration of donning gown, however able to verbalize technique using teach back method. Reviewed bathing and showering technique for safety with LUE while maintaining precautions, pt able to verbalize using teach back method. Reveiwed sling wearing schedule and pt was able to don sling with min A      Vision                     Perception     Praxis      Cognition   Behavior During Therapy: [facility] for tasks assessed/performed Overall Cognitive Status: Within Functional Limits for tasks assessed                       Extremity/Trunk Assessment               Exercises Method for sponge bathing under operated UE: Minimal assistance Donning/doffing sling/immobilizer: Minimal assistance Correct positioning of sling/immobilizer: Independent Sling wearing schedule (on at all times/off for ADL's): Independent Proper positioning of operated UE when showering: Independent Positioning of UE while sleeping: Independent   Shoulder Instructions Shoulder Instructions Method for sponge bathing under operated UE: Minimal assistance Donning/doffing sling/immobilizer: Minimal assistance Correct positioning of sling/immobilizer: Independent Sling wearing schedule (on at all times/off for ADL's): Independent Proper positioning of operated UE when showering: Independent Positioning of UE while sleeping: Independent  General Comments      Pertinent Vitals/ Pain       Pain Assessment: 0-10 Pain Score: 3  Pain Location: Lt shoulder Pain Descriptors / Indicators: Aching Pain Intervention(s): Monitored during session;Repositioned  Home Living                                          Prior Functioning/Environment               Frequency Min 2X/week     Progress Toward Goals  OT Goals(current goals can now be found in the care plan section)  Progress towards OT goals: Progressing toward goals  Acute Rehab OT Goals Patient Stated Goal: to go home OT Goal Formulation: With patient Time For Goal Achievement: 10/14/15 Potential to Achieve Goals: Good ADL Goals Pt Will Perform Grooming: with modified independence;standing Pt Will Perform Upper Body Bathing: with caregiver independent in assisting;sitting Pt Will Perform Lower Body Bathing: with min assist;with adaptive equipment;sitting/lateral leans Pt Will Perform Upper Body Dressing: with supervision;sitting Pt Will Perform Lower Body Dressing: with min assist;with adaptive equipment;sit to/from stand Pt Will Perform Tub/Shower Transfer: Tub transfer;with supervision;ambulating;tub bench Additional ADL Goal #1: Pt will be independent with donning and doffing sling  Plan Discharge plan remains appropriate    Co-evaluation                 End of Session Equipment Utilized During Treatment: Gait belt;Other (comment) (LUE sling)   Activity Tolerance Patient tolerated treatment well   Patient Left in chair;with call bell/phone within reach;with chair alarm set   Nurse Communication Patient requests pain meds        Time: 0973-5329 OT Time Calculation (min): 24 min  Charges: OT General Charges $OT Visit: 1 Procedure OT Treatments $Self Care/Home Management : 23-37 mins  Lin Landsman 10/01/2015, 10:23 AM

## 2015-10-02 MED ORDER — SENNA-DOCUSATE SODIUM 8.6-50 MG PO TABS: 2.0000 | ORAL_TABLET | Freq: Every day | ORAL | Status: DC

## 2015-10-02 MED ORDER — BETHANECHOL CHLORIDE 10 MG PO TABS
10.0000 mg | ORAL_TABLET | Freq: Three times a day (TID) | ORAL | Status: DC
Start: 2015-10-02 — End: 2015-10-02

## 2015-10-02 MED ORDER — BETHANECHOL CHLORIDE 10 MG PO TABS: 10.0000 mg | ORAL_TABLET | Freq: Three times a day (TID) | ORAL | Status: DC

## 2015-10-02 MED ORDER — ONDANSETRON HCL 4 MG PO TABS: 4.0000 mg | ORAL_TABLET | Freq: Three times a day (TID) | ORAL | Status: DC | PRN

## 2015-10-02 MED ORDER — BACLOFEN 10 MG PO TABS: 10.0000 mg | ORAL_TABLET | Freq: Three times a day (TID) | ORAL | Status: DC

## 2015-10-02 NOTE — Care Management Important Message (Signed)
Important Message  Patient Details  Name: Jay Bradshaw MRN: [id number] Date of Birth: [birth year]   Medicare Important Message Given:  Yes-second notification given    Delorse Lek 10/02/2015, 1:31 PM

## 2015-10-02 NOTE — Progress Notes (Signed)
Pt discharged to home.  Discharge instructions explained to pt.  Pt has questions regarding his medications.  He wanted his medicine to be called in to [facility] on [address].  Called MD and he stated he done it.  Paper prescription also printed, so gave patient a copy of that as well.  Pt states he has all belongings.  IV removed.  Pt ambulated off unit on his own.

## 2015-10-02 NOTE — Progress Notes (Signed)
PT Cancellation Note  Patient Details Name: Jay Bradshaw MRN: [id number] DOB: 1935-08-16   Cancelled Treatment:    Reason Eval/Treat Not Completed: Other (comment) (Declined as he is leaving shortly).  Talked with pt and wife about calling back if further questions occur.   Ramond Dial 10/02/2015, 3:00 PM   Mee Hives, PT MS Acute Rehab Dept. Number: [phone] [phone] and [place] [phone]

## 2015-10-02 NOTE — Progress Notes (Signed)
Occupational Therapy Treatment Patient Details Name: Jay Bradshaw MRN: [id number] DOB: [birth year] Today's Date: 10/02/2015    History of present illness 80 y.o. male s/p Lt total shoulder arthroplasty. PMH: hyperlipdemia, arthritis, COPD, SOB, tremors and OA rt shoulder s/p R TSA in Jan 2016.   OT comments  Focus of session included UB and LB dressing required for ADL independence. Pt requiring min A for donning shirt. Pt reports having dressing stick to assist with UB dressing when wife is not available to assist. Reviewed shoulder protocol for ADL safety, pt able to recall using teach back method. Pt progressing towards OT goals.  Follow Up Recommendations  Outpatient OT    Equipment Recommendations  None recommended by OT    Recommendations for Other Services      Precautions / Restrictions Precautions Precautions: Fall;Shoulder Shoulder Interventions: Shoulder sling/immobilizer;Off for dressing/bathing/exercises Precaution Booklet Issued: Yes (comment) Precaution Comments: reviewed shoulder precautions and NWB status Required Braces or Orthoses: Sling Restrictions Weight Bearing Restrictions: Yes LUE Weight Bearing: Non weight bearing       Mobility Bed Mobility               General bed mobility comments: Pt up in recliner upon arrival  Transfers Overall transfer level: Needs assistance Equipment used: None Transfers: Sit to/from Stand Sit to Stand: Supervision              Balance Overall balance assessment: Needs assistance Sitting-balance support: No upper extremity supported;Feet supported Sitting balance-Leahy Scale: Good     Standing balance support: No upper extremity supported;During functional activity Standing balance-Leahy Scale: Fair                     ADL Overall ADL's : Needs assistance/impaired                 Upper Body Dressing : Minimal assistance;Sitting Upper Body Dressing Details (indicate cue type and  reason): pt able to don shirt using one handed technique, however requires min A to complete. Pt reports he has dressing stick at home to assist with donning shirt when wife is not present to assit Lower Body Dressing: Min guard;Sit to/from stand               Functional mobility during ADLs: Supervision/safety General ADL Comments: Reviewed shoulder protocol regarding ADL. Pt able to perform UB and LB dressing requiring min A for UB dressing and min guard for safety with LB dressing and required verbal cues to maintain precuations.      Vision                     Perception     Praxis      Cognition   Behavior During Therapy: WFL for tasks assessed/performed Overall Cognitive Status: Within Functional Limits for tasks assessed                       Extremity/Trunk Assessment               Exercises Donning/doffing shirt without moving shoulder: Minimal assistance Donning/doffing sling/immobilizer: Minimal assistance   Shoulder Instructions Shoulder Instructions Donning/doffing shirt without moving shoulder: Minimal assistance Donning/doffing sling/immobilizer: Minimal assistance     General Comments      Pertinent Vitals/ Pain       Pain Assessment: 0-10 Pain Score: 3  Pain Location: Lt shoulder Pain Descriptors / Indicators: Aching Pain Intervention(s): Monitored during session  Home Living  Prior Functioning/Environment              Frequency Min 2X/week     Progress Toward Goals  OT Goals(current goals can now be found in the care plan section)  Progress towards OT goals: Progressing toward goals  Acute Rehab OT Goals Patient Stated Goal: to go home today OT Goal Formulation: With patient Time For Goal Achievement: 10/14/15 Potential to Achieve Goals: Good ADL Goals Pt Will Perform Grooming: with modified independence;standing Pt Will Perform Upper Body Bathing:  with caregiver independent in assisting;sitting Pt Will Perform Lower Body Bathing: with min assist;with adaptive equipment;sitting/lateral leans Pt Will Perform Upper Body Dressing: with supervision;sitting Pt Will Perform Lower Body Dressing: with min assist;with adaptive equipment;sit to/from stand Pt Will Perform Tub/Shower Transfer: Tub transfer;with supervision;ambulating;tub bench Additional ADL Goal #1: Pt will be independent with donning and doffing sling  Plan Discharge plan remains appropriate    Co-evaluation                 End of Session Equipment Utilized During Treatment: Gait belt   Activity Tolerance Patient tolerated treatment well   Patient Left in chair;with call bell/phone within reach;with family/visitor present   Nurse Communication          Time: 1206-1223 OT Time Calculation (min): 17 min  Charges: OT General Charges $OT Visit: 1 Procedure OT Treatments $Self Care/Home Management : 8-22 mins  Lin Landsman 10/02/2015, 3:13 PM

## 2015-10-02 NOTE — Discharge Summary (Signed)
Physician Discharge Summary  Patient ID: Jay Bradshaw MRN: [id number] DOB/AGE: 30-Aug-1935 80 y.o.  Admit date: 09/29/2015 Discharge date: 10/02/2015  Admission Diagnoses:  Osteoarthritis of left shoulder  Discharge Diagnoses:  Principal Problem:   Osteoarthritis of left shoulder Active Problems:   S/P shoulder replacement   Past Medical History  Diagnosis Date  . Hyperlipidemia     takes Lovastatin daily  . Arthritis   . COPD (chronic obstructive pulmonary disease) ([place])   . Shortness of breath dyspnea   . Tremors of nervous system   . Osteoarthritis of right shoulder, primary 12/16/2014  . Complication of anesthesia   . PONV (postoperative nausea and vomiting)   . Hypothyroidism     takes Synthroid daily  . Constipation     takes Colace daily as needed  . Urinary frequency     takes Ditropan daily  . Enlarged prostate     takes Tamsulosin daily  . Coronary artery disease     takes Metoprolol daily  . Emphysema lung ([place])   . Productive cough   . Balance problem   . Joint pain   . Osteoarthritis of left shoulder 09/29/2015  . Seasonal allergies     Surgeries: Procedure(s): TOTAL LEFT SHOULDER ARTHROPLASTY on 09/29/2015   Consultants (if any):    Discharged Condition: Improved  Hospital Course: Zacharius Funari is an 80 y.o. male y.o. male who was admitted 09/29/2015 with a diagnosis of Osteoarthritis of left shoulder and went to the operating room on 09/29/2015 and underwent the above named procedures.    He was given perioperative antibiotics:  Anti-infectives    Start     Dose/Rate Route Frequency Ordered Stop   09/29/15 1600  ceFAZolin (ANCEF) IVPB 1 g/50 mL premix     1 g 100 mL/hr over 30 Minutes Intravenous Every 6 hours 09/29/15 1454 09/30/15 0418   09/29/15 1421  ceFAZolin (ANCEF) 1-5 GM-% IVPB    Comments:  Noe Gens   : cabinet override      09/29/15 1421 09/29/15 1426   09/29/15 0700  ceFAZolin (ANCEF) IVPB 2 g/50 mL premix     2 g 100 mL/hr over 30  Minutes Intravenous To ShortStay Surgical 09/28/15 1104 09/29/15 0759    .  He was given sequential compression devices, early ambulation, for DVT prophylaxis.  He was given a Foley preoperatively, given his history of urinary retention, also started on urocholine, and was ultimately able to void on postoperative day one after discontinuation of his Foley.  He benefited maximally from the hospital stay and there were no complications.    Recent vital signs:  Filed Vitals:   10/02/15 0613  BP: 123/58  Pulse: 70  Temp: 97.7 F (36.5 C)  Resp: 16    Recent laboratory studies:  Lab Results  Component Value Date   HGB 11.5* 09/30/2015   HGB 12.9* 09/18/2015   HGB 11.3* 12/17/2014   Lab Results  Component Value Date   WBC 7.5 09/30/2015   PLT 205 09/30/2015   Lab Results  Component Value Date   INR 1.03 09/23/2015   Lab Results  Component Value Date   NA 132* 09/30/2015   K 4.5 09/30/2015   CL 100* 09/30/2015   CO2 22 09/30/2015   BUN 14 09/30/2015   CREATININE 0.93 09/30/2015   GLUCOSE 117* 09/30/2015    Discharge Medications:     Medication List    STOP taking these medications        oxybutynin 5 MG tablet  Commonly known as:  DITROPAN      TAKE these medications        alendronate 70 MG tablet  Commonly known as:  FOSAMAX  Take 70 mg by mouth once a week. Take with a full glass of water on an empty stomach.     aspirin 81 MG tablet  Take 81 mg by mouth daily.     baclofen 10 MG tablet  Commonly known as:  LIORESAL  Take 1 tablet (10 mg total) by mouth 3 (three) times daily. As needed for muscle spasm     bethanechol 10 MG tablet  Commonly known as:  URECHOLINE  Take 1 tablet (10 mg total) by mouth 3 (three) times daily.     CALCIUM-MAGNESIUM PO  Take 500-600 mg by mouth 2 (two) times daily.     cholecalciferol 400 UNITS Tabs tablet  Commonly known as:  VITAMIN D  Take 400 Units by mouth 2 (two) times daily.     Co Q10 100 MG Caps  Take  200 mg by mouth daily.     docusate sodium 100 MG capsule  Commonly known as:  COLACE  Take 100 mg by mouth daily as needed for mild constipation.     HYDROcodone-acetaminophen 10-325 MG tablet  Commonly known as:  NORCO  Take 1-2 tablets by mouth every 6 (six) hours as needed for severe pain.     levothyroxine 88 MCG tablet  Commonly known as:  SYNTHROID, LEVOTHROID  Take 88 mcg by mouth daily before breakfast.     lovastatin 20 MG tablet  Commonly known as:  MEVACOR  Take 20 mg by mouth at bedtime.     Melatonin 3 MG Tabs  Take 1 tablet by mouth at bedtime as needed (sleep).     metoprolol succinate 50 MG 24 hr tablet  Commonly known as:  TOPROL-XL  Take 50 mg by mouth at bedtime. Patient reports that he takes when he gets up in the middle of the night. Take with or immediately following a meal.     multivitamin capsule  Take 1 capsule by mouth every other day.     nitroGLYCERIN 0.4 MG SL tablet  Commonly known as:  NITROSTAT  Place 0.4 mg under the tongue every 5 (five) minutes as needed for chest pain (up to 3 doses MAX).     ondansetron 4 MG tablet  Commonly known as:  ZOFRAN  Take 1 tablet (4 mg total) by mouth every 8 (eight) hours as needed for nausea or vomiting.     PRESERVISION AREDS 2 Caps  Take 1 capsule by mouth every other day.     PSYLLIUM HUSK PO  Take 1 capsule by mouth 3 (three) times daily as needed (depending on meals he ate that day).     saw palmetto 160 MG capsule  Take 160 mg by mouth 3 (three) times daily.     sennosides-docusate sodium 8.6-50 MG tablet  Commonly known as:  SENOKOT-S  Take 2 tablets by mouth daily.     tamsulosin 0.4 MG Caps capsule  Commonly known as:  FLOMAX  Take 0.4 mg by mouth daily.     vitamin C 500 MG tablet  Commonly known as:  ASCORBIC ACID  Take 500 mg by mouth every other day.     vitamin E 400 UNIT capsule  Generic drug:  vitamin E  Take 300 Units by mouth every other day.        Diagnostic  Studies:  Dg Chest 2 View  09/18/2015  CLINICAL DATA:  Preop for shoulder surgery EXAM: CHEST  2 VIEW COMPARISON:  None. FINDINGS: Lungs are hyperaerated. Pleural thickening at the lung apices have a chronic appearance. Small nodular densities are scattered throughout the left lung and at the right lung base. They measure 5 mm or less in diameter. No pneumothorax. No pleural effusion. No vertebral compression deformity. IMPRESSION: Possible pulmonary nodules. Comparison with prior studies if available is recommended. If none are available, CT can be performed. Electronically Signed   By: Marybelle Killings M.D.   On: 09/18/2015 12:35   Ct Chest Wo Contrast  09/29/2015  CLINICAL DATA:  Pulmonary nodule seen on recent chest radiograph. EXAM: CT CHEST WITHOUT CONTRAST TECHNIQUE: Multidetector CT imaging of the chest was performed following the standard protocol without IV contrast. COMPARISON:  Chest radiograph on 09/18/2015 FINDINGS: Mediastinum/Lymph Nodes: No masses or pathologically enlarged lymph nodes identified on this un-enhanced exam. Lungs/Pleura: Mild bilateral upper lobe predominant pleural-parenchymal scarring noted. No pulmonary mass, infiltrate, or effusion. Upper abdomen: No acute findings. Musculoskeletal: No suspicious bone lesions identified. Right shoulder prosthesis noted. Severe left glenohumeral osteoarthritis also demonstrated. IMPRESSION: No evidence of pulmonary neoplasm or other active disease within the thorax. Electronically Signed   By: Earle Gell M.D.   On: 09/29/2015 08:01   Dg Shoulder Left Port  09/29/2015  CLINICAL DATA:  Status post left shoulder replacement EXAM: LEFT SHOULDER - 1 VIEW: AP neutral view COMPARISON:  CT scan of the left shoulder of September 03, 2014 FINDINGS: The patient has undergone left shoulder prosthesis placement. Radiographic positioning of the prosthetic components is good. The interface with the native bone is normal. IMPRESSION: The patient has undergone  left shoulder joint prosthesis placement without evidence of immediate postprocedure complication. Electronically Signed   By: Davante  Martinique M.D.   On: 09/29/2015 12:31    Disposition: 01-Home or Self Care        Follow-up Information    Follow up with Johnny Bridge, MD. Schedule an appointment as soon as possible for a visit in 2 weeks.   Specialty:  Orthopedic Surgery   Contact information:   [place] [phone]        Signed: Johnny Bridge 10/02/2015, 3:08 PM

## 2015-10-13 DIAGNOSIS — M19012 Primary osteoarthritis, left shoulder: Secondary | ICD-10-CM | POA: Diagnosis not present

## 2015-11-02 DIAGNOSIS — L821 Other seborrheic keratosis: Secondary | ICD-10-CM | POA: Diagnosis not present

## 2015-11-02 DIAGNOSIS — D2239 Melanocytic nevi of other parts of face: Secondary | ICD-10-CM | POA: Diagnosis not present

## 2015-11-02 DIAGNOSIS — D225 Melanocytic nevi of trunk: Secondary | ICD-10-CM | POA: Diagnosis not present

## 2015-11-02 DIAGNOSIS — L218 Other seborrheic dermatitis: Secondary | ICD-10-CM | POA: Diagnosis not present

## 2015-11-10 DIAGNOSIS — M19012 Primary osteoarthritis, left shoulder: Secondary | ICD-10-CM | POA: Diagnosis not present

## 2015-11-11 DIAGNOSIS — Z961 Presence of intraocular lens: Secondary | ICD-10-CM | POA: Diagnosis not present

## 2015-11-11 DIAGNOSIS — H35363 Drusen (degenerative) of macula, bilateral: Secondary | ICD-10-CM | POA: Diagnosis not present

## 2015-11-17 ENCOUNTER — Ambulatory Visit: Payer: Medicare HMO | Admitting: Rehabilitative and Restorative Service Providers"

## 2015-11-19 ENCOUNTER — Encounter: Payer: Self-pay | Admitting: Rehabilitative and Restorative Service Providers"

## 2015-11-19 ENCOUNTER — Ambulatory Visit (INDEPENDENT_AMBULATORY_CARE_PROVIDER_SITE_OTHER): Payer: Medicare HMO | Admitting: Rehabilitative and Restorative Service Providers"

## 2015-11-19 DIAGNOSIS — M2560 Stiffness of unspecified joint, not elsewhere classified: Secondary | ICD-10-CM

## 2015-11-19 DIAGNOSIS — M25512 Pain in left shoulder: Secondary | ICD-10-CM | POA: Diagnosis not present

## 2015-11-19 DIAGNOSIS — R29898 Other symptoms and signs involving the musculoskeletal system: Secondary | ICD-10-CM | POA: Diagnosis not present

## 2015-11-19 DIAGNOSIS — M623 Immobility syndrome (paraplegic): Secondary | ICD-10-CM | POA: Diagnosis not present

## 2015-11-19 DIAGNOSIS — Z7409 Other reduced mobility: Secondary | ICD-10-CM

## 2015-11-19 DIAGNOSIS — R531 Weakness: Secondary | ICD-10-CM

## 2015-11-19 DIAGNOSIS — R6889 Other general symptoms and signs: Secondary | ICD-10-CM

## 2015-11-19 NOTE — Therapy (Addendum)
[place], [place], [address] Phone: [phone]   Fax:  [phone]  Physical Therapy Evaluation  Patient Details  Name: Jay Bradshaw MRN: [id number] Date of Birth: 1935-02-21 Referring Provider: Carter Kitten  Encounter Date: 11/19/2015      PT End of Session - 11/19/15 0850    Visit Number 1   Number of Visits 24   Date for PT Re-Evaluation 02/11/16   PT Start Time 0850   PT Stop Time 0949   PT Time Calculation (min) 59 min   Activity Tolerance Patient tolerated treatment well      Past Medical History  Diagnosis Date  . Hyperlipidemia     takes Lovastatin daily  . Arthritis   . COPD (chronic obstructive pulmonary disease) ([place])   . Shortness of breath dyspnea   . Tremors of nervous system   . Osteoarthritis of right shoulder, primary 12/16/2014  . Complication of anesthesia   . PONV (postoperative nausea and vomiting)   . Hypothyroidism     takes Synthroid daily  . Constipation     takes Colace daily as needed  . Urinary frequency     takes Ditropan daily  . Enlarged prostate     takes Tamsulosin daily  . Coronary artery disease     takes Metoprolol daily  . Emphysema lung ([place])   . Productive cough   . Balance problem   . Joint pain   . Osteoarthritis of left shoulder 09/29/2015  . Seasonal allergies     Past Surgical History  Procedure Laterality Date  . Cataract extraction  Aug and Sep 2011    first left eye, then right eye  . Colonoscopy  June 2008    Dr. Lurena Joiner ref by Ruthann Cancer  . Irrigation and debridement sebaceous cyst  2010    from the back, [employer]  . Prostate biopsy  2006    Dr. Nadara Mustard [place]  . Lipoma excision  Nov 2004    from neck, right side (near location of mandible cyst), Dr Lorrine Kin ref by [facility]  . Excision of fibroma  October 1994    in skin on back of neck right of midline, Dede Query ref by Lidia Collum  . Vacuolar cyst removal      in right mandible fixed/filled, Denyce Robert ref by Synetta Shadow, DDS  . Thyroid adenoma removal  August 1970    right side, [facility]  . Hernia repair    . Shoulder surgery Right 12/16/2014    hemi-arthroplasty    dr Mardelle Matte  . Total shoulder arthroplasty Right 12/16/2014    Procedure: RIGHT TOTAL SHOULDER ARTHROPLASTY;  Surgeon: Johnny Bridge, MD;  Location: [facility];  Service: Orthopedics;  Laterality: Right;  . Shoulder hemi-arthroplasty Right 12/16/2014    Procedure: SHOULDER HEMI-ARTHROPLASTY;  Surgeon: Johnny Bridge, MD;  Location: [facility];  Service: Orthopedics;  Laterality: Right;  . Wisdom teeth extracted    . Cardiac catheterization N/A 09/25/2015    Procedure: Left Heart Cath and Coronary Angiography;  Surgeon: Burnell Blanks, MD;  Location: [facility];  Service: Cardiovascular;  Laterality: N/A;  . Total shoulder arthroplasty Left 09/29/2015  . Total shoulder arthroplasty Left 09/29/2015    Procedure: TOTAL LEFT SHOULDER ARTHROPLASTY;  Surgeon: Marchia Bond, MD;  Location: [facility];  Service: Orthopedics;  Laterality: Left;    There were no vitals filed for this visit.  Visit Diagnosis:  Left shoulder pain - Plan:  PT plan of care cert/re-cert  Weakness of left upper extremity - Plan: PT plan of care cert/re-cert  Stiffness due to immobility - Plan: PT plan of care cert/re-cert  Decreased strength, endurance, and mobility - Plan: PT plan of care cert/re-cert      Subjective Assessment - 11/19/15 0858    Subjective Patient reports history of Lt shoulder pain for the past three years. Underwent Lt total shoulder replacement [id number] - without complications. he continues to have intermittent pain; limited ROM; limited functional activity.    Pertinent History Rt hemi-arthroplasty 12/16/14 ; osteoporesis    How long can you sit comfortably? no limit   How long can you stand comfortably? no limit   How long can you walk comfortably? no limit    Diagnostic tests xrays   Patient Stated Goals improve ROM in shoudler and improve functioin in Lt and Rt arms    Currently in Pain? Yes   Pain Score 1    Pain Location Shoulder   Pain Orientation Left   Pain Descriptors / Indicators Aching   Pain Type Chronic pain   Pain Onset More than a month ago   Pain Frequency Intermittent   Aggravating Factors  moving shoulder/arm; using arm    Pain Relieving Factors rest/sleep             [facility] PT Assessment - 11/19/15 0001    Assessment   Medical Diagnosis Lt TSA   Referring Provider Carter Kitten   Onset Date/Surgical Date 09/29/15   Hand Dominance Right   Next MD Visit 1/17   Prior Therapy for Rt shoulder    Precautions   Precautions Shoulder   Precaution Comments no lifting;reaching; pulling; etc...   Balance Screen   Has the patient fallen in the past 6 months No   Has the patient had a decrease in activity level because of a fear of falling?  No   Is the patient reluctant to leave their home because of a fear of falling?  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    Leisure assists with household chores and caring for wife; reading    Observation/Other Assessments   Focus on Therapeutic Outcomes (FOTO)  58% limitation    Sensation   Additional Comments numbness in the mid arm/deltoid area - 'feels different'   Posture/Postural Control   Posture Comments head forward; shoulders rounded and elevated; head of the humerus anterior in orientation; increased thoracic kyphosis; scapulae abducted and rotated along the thoracic wall    AROM   Overall AROM Comments AROM assessed in standing    Right Shoulder Extension 47 Degrees   Right Shoulder Flexion 112 Degrees   Right Shoulder ABduction 96 Degrees   Right Shoulder Internal Rotation 22  Degrees   Right Shoulder External Rotation 51 Degrees   Left Shoulder Extension 38 Degrees   Left Shoulder Flexion 81 Degrees   Left Shoulder ABduction 72 Degrees   Left Shoulder Internal Rotation 16 Degrees   Left Shoulder External Rotation 21 Degrees   PROM   Overall PROM Comments PROM assessed in supine    Left Shoulder Flexion 115 Degrees   Left Shoulder ABduction 84 Degrees   Left Shoulder Internal Rotation 35 Degrees   Left Shoulder External Rotation 11  Degrees   Strength   Right Shoulder Flexion --  5-/5   Right Shoulder Extension 5/5   Right Shoulder ABduction 5/5   Right Shoulder Internal Rotation 5/5   Right Shoulder External Rotation 5/5   Palpation   Palpation comment tenderness/tightness through pecs/upper trap/deltoid/teres       Therapeutic exercise per flow sheet   Moist heat Lt shoulder x 15 min  IFC lt shoulder; to tolerance x15 min                   PT Education - 11/19/15 0905    Education provided Yes   Education Details HEP   Person(s) Educated Patient   Methods Explanation;Demonstration;Tactile cues;Verbal cues;Handout   Comprehension Verbalized understanding;Returned demonstration;Verbal cues required;Tactile cues required          PT Short Term Goals - 11/19/15 1327    PT SHORT TERM GOAL #1   Title Patient I In initial HEP 12/18/15   Time 4   Period Weeks   Status New   PT SHORT TERM GOAL #2   Title Improve posture and alignment for exercises and functional activities with tactile and/or verbal cues of therapist 12/31/15   Time 6   Period Weeks   Status New   PT SHORT TERM GOAL #3   Title Patient reports using Lt UE for washing face; combing hair; assisting for kitchen tasks 12/31/15   Time 6   Period Weeks   Status New   PT SHORT TERM GOAL #4   Title Increase active shoudler ROM by 10-20 degrees in all planes 12/31/15   Time 6   Period Weeks   Status New           PT Long Term Goals - 11/19/15 1330    PT LONG TERM  GOAL #1   Title Patient reports using Lt UE for most normal functional activities that do not require lifting >5 lbs. 02/11/16   Time 12   Period Weeks   Status New   PT LONG TERM GOAL #2   Title AROM Lt shoudler equal or greater than Rt shoudler 02/11/16   Time 12   Period Weeks   Status New   PT LONG TERM GOAL #3   Title 4/5 to 5/5 Lt shoulder strength 02/11/16   Time 12   Period Weeks   Status New   PT LONG TERM GOAL #4   Title I in HEP 02/11/16   Time 12   Period Weeks   Status New   PT LONG TERM GOAL #5   Title Improve FOTO to </=37% limitation 02/11/16   Time 12   Period Weeks   Status New               Plan - 11/19/15 1324    Pt will benefit from skilled therapeutic intervention in order to improve on the following deficits Decreased range of motion;Decreased strength;Decreased activity tolerance;Decreased endurance;Postural dysfunction;Improper body mechanics   Rehab Potential Good   PT Frequency 2x / week   PT Duration 12 weeks   PT Treatment/Interventions Patient/family education;Therapeutic activities;Therapeutic exercise;Manual techniques;Dry needling;Cryotherapy;Electrical Stimulation;Moist Heat;Ultrasound   PT Next Visit Plan DOS: 09/29/15 Patient 7 wks 3 days post Lt TSA- progress with ROM; stabilization and strengthening per protocol/as indicated    PT Home Exercise Plan postural correction; HEP    Consulted and Agree with Plan of Care Patient         Problem List Patient Active Problem List   Diagnosis  Date Noted  . Osteoarthritis of left shoulder 09/29/2015  . Precordial pain   . Abnormal stress test   . Osteoarthritis of right shoulder, primary 12/16/2014  . S/P shoulder replacement 12/16/2014  . Preop cardiovascular exam 09/17/2014  . CAD (coronary artery disease) 01/29/2014  . HTN (hypertension) 01/29/2014  . Tremor 01/29/2014    Celyn Nilda Simmer PT, MPH  11/19/2015, 1:36 PM  [facility] [place], [place], [address] Phone: [phone]   Fax:  [phone]  Name: Jay Bradshaw MRN: [id number] Date of Birth: [birth year]

## 2015-11-19 NOTE — Patient Instructions (Signed)
Shoulder Blade Squeeze    Rotate shoulders back, then squeeze shoulder blades down and back Hold 10 sec Repeat __10__ times. Do __3-4__ sessions per day. Swim noodle along spine   SUPINE Tips A    Being in the supine position means to be lying on the back. Lying on the back is the position of least compression on the bones and discs of the spine, and helps to re-align the natural curves of the back.    Thoracic Lift    Press shoulders down. Then lift mid-thoracic spine (area between the shoulder blades). Lift the breastbone slightly. Hold __10_ seconds. Relax. Repeat _10 times. 2-3 times per day     Pendulum Circular    Bend forward 90 at waist, leaning on table for support. Rock body in a circular pattern to move arm clockwise __30__ times then counterclockwise __30__ times. Do __3-4__ sessions per day.   External Rotator Cuff Stretch, Sitting (Passive)    Sit with elbow bent, forearm on table, palm down. Bend forward at waist until a stretch is felt in shoulder. Hold _10__ seconds.  Repeat _10__ times per session. Do _3-4__ sessions per day.   External Rotator Cuff Stretch, Supine (Passive)   Standing up! Lie supine, one elbow against ribs, and bent at 90, dowel in palm, other hand holding dowel up. Use other arm to push forearm toward floor. Keep elbow against side. Hold __30_ seconds. Repeat _10__ times per session. Do __3-4_ sessions per day.

## 2015-12-02 ENCOUNTER — Encounter: Payer: Self-pay | Admitting: Rehabilitative and Restorative Service Providers"

## 2015-12-02 ENCOUNTER — Ambulatory Visit (INDEPENDENT_AMBULATORY_CARE_PROVIDER_SITE_OTHER): Payer: Medicare HMO | Admitting: Rehabilitative and Restorative Service Providers"

## 2015-12-02 DIAGNOSIS — M2560 Stiffness of unspecified joint, not elsewhere classified: Secondary | ICD-10-CM

## 2015-12-02 DIAGNOSIS — Z7409 Other reduced mobility: Secondary | ICD-10-CM

## 2015-12-02 DIAGNOSIS — M623 Immobility syndrome (paraplegic): Secondary | ICD-10-CM | POA: Diagnosis not present

## 2015-12-02 DIAGNOSIS — R531 Weakness: Secondary | ICD-10-CM

## 2015-12-02 DIAGNOSIS — R29898 Other symptoms and signs involving the musculoskeletal system: Secondary | ICD-10-CM | POA: Diagnosis not present

## 2015-12-02 DIAGNOSIS — M25512 Pain in left shoulder: Secondary | ICD-10-CM | POA: Diagnosis not present

## 2015-12-02 DIAGNOSIS — R6889 Other general symptoms and signs: Secondary | ICD-10-CM

## 2015-12-02 NOTE — Therapy (Signed)
[place], [place], [address] Phone: [phone]   Fax:  [phone]  Physical Therapy Treatment  Patient Details  Name: Jay Bradshaw MRN: [id number] Date of Birth: Mar 26, 1935 Referring Provider: Carter Kitten  Encounter Date: 12/02/2015      PT End of Session - 12/02/15 0935    Visit Number 2   Number of Visits 24   Date for PT Re-Evaluation 02/11/16   PT Start Time 0933   PT Stop Time 1030   PT Time Calculation (min) 57 min   Activity Tolerance Patient tolerated treatment well      Past Medical History  Diagnosis Date  . Hyperlipidemia     takes Lovastatin daily  . Arthritis   . COPD (chronic obstructive pulmonary disease) ([place])   . Shortness of breath dyspnea   . Tremors of nervous system   . Osteoarthritis of right shoulder, primary 12/16/2014  . Complication of anesthesia   . PONV (postoperative nausea and vomiting)   . Hypothyroidism     takes Synthroid daily  . Constipation     takes Colace daily as needed  . Urinary frequency     takes Ditropan daily  . Enlarged prostate     takes Tamsulosin daily  . Coronary artery disease     takes Metoprolol daily  . Emphysema lung ([place])   . Productive cough   . Balance problem   . Joint pain   . Osteoarthritis of left shoulder 09/29/2015  . Seasonal allergies     Past Surgical History  Procedure Laterality Date  . Cataract extraction  Aug and Sep 2011    first left eye, then right eye  . Colonoscopy  June 2008    Dr. Lurena Joiner ref by Ruthann Cancer  . Irrigation and debridement sebaceous cyst  2010    from the back, [employer]  . Prostate biopsy  2006    Dr. Nadara Mustard [place]  . Lipoma excision  Nov 2004    from neck, right side (near location of mandible cyst), Dr Lorrine Kin ref by Rasalen  . Excision of fibroma  October 1994    in skin on back of neck right of midline, Dede Query ref by Lidia Collum  . Vacuolar cyst removal     in right mandible fixed/filled, Denyce Robert ref by Synetta Shadow, DDS  . Thyroid adenoma removal  August 1970    right side, O. Ptr.Schumacher/Hermann  . Hernia repair    . Shoulder surgery Right 12/16/2014    hemi-arthroplasty    dr Mardelle Matte  . Total shoulder arthroplasty Right 12/16/2014    Procedure: RIGHT TOTAL SHOULDER ARTHROPLASTY;  Surgeon: Johnny Bridge, MD;  Location: [facility];  Service: Orthopedics;  Laterality: Right;  . Shoulder hemi-arthroplasty Right 12/16/2014    Procedure: SHOULDER HEMI-ARTHROPLASTY;  Surgeon: Johnny Bridge, MD;  Location: [facility];  Service: Orthopedics;  Laterality: Right;  . Wisdom teeth extracted    . Cardiac catheterization N/A 09/25/2015    Procedure: Left Heart Cath and Coronary Angiography;  Surgeon: Burnell Blanks, MD;  Location: [facility];  Service: Cardiovascular;  Laterality: N/A;  . Total shoulder arthroplasty Left 09/29/2015  . Total shoulder arthroplasty Left 09/29/2015    Procedure: TOTAL LEFT SHOULDER ARTHROPLASTY;  Surgeon: Marchia Bond, MD;  Location: [facility];  Service: Orthopedics;  Laterality: Left;    There were no vitals filed for this visit.  Visit Diagnosis:  Left shoulder pain  Weakness of  left upper extremity  Stiffness due to immobility  Decreased strength, endurance, and mobility      Subjective Assessment - 12/02/15 0936    Subjective Jay Bradshaw reports that he did some traveling over the holidays. He has been doing exercises for his shoulder since he was last in for PT. He has some aching at the end of the day more so than the morning.    Currently in Pain? Yes   Pain Score 1    Pain Location Shoulder   Pain Orientation Left   Pain Descriptors / Indicators Aching            [facility] PT Assessment - 12/02/15 0001    Assessment   Medical Diagnosis Lt TSA   Referring Provider Carter Kitten   Onset Date/Surgical Date 09/29/15   Hand Dominance Right   Next MD Visit 12/07/15   Prior Therapy for Rt shoulder     Precautions   Precautions Shoulder   Precaution Comments no lifting;reaching; pulling; etc...   PROM   Overall PROM Comments PROM assessed in supine    Left Shoulder Flexion 130 Degrees   Left Shoulder ABduction 95 Degrees   Left Shoulder Internal Rotation 38 Degrees   Left Shoulder External Rotation 27 Degrees   Palpation   Palpation comment tenderness/tightness through pecs/upper trap/deltoid/teres                     [facility] Adult PT Treatment/Exercise - 12/02/15 0001    Shoulder Exercises: Seated   Other Seated Exercises initiation of deltoid 2 sec x 10    Shoulder Exercises: Standing   External Rotation Both;10 reps;Theraband  with noodle    Theraband Level (Shoulder External Rotation) Level 1 (Yellow)   Extension Both;10 reps;Theraband   Theraband Level (Shoulder Extension) Level 2 (Red)   Row Both;10 reps;Theraband   Theraband Level (Shoulder Row) Level 2 (Red)   Other Standing Exercises scap squeeze with noodle 10 sec x 10    Shoulder Exercises: Pulleys   Flexion Limitations 10 sec hold x 10 reps    ABduction Limitations 10 sec x 5    Shoulder Exercises: Therapy Ball   Other Therapy Ball Exercises ball on wall CW/CCW    Shoulder Exercises: Stretch   External Rotation Stretch --  with cane 10 sec x 10 standing    Table Stretch - Flexion --  10 sec x 10    Moist Heat Therapy   Number Minutes Moist Heat 15 Minutes   Moist Heat Location Shoulder  LT   Electrical Stimulation   Electrical Stimulation Location Lt shd    Electrical Stimulation Action IFC   Electrical Stimulation Parameters to tolerance    Electrical Stimulation Goals Pain   Manual Therapy   Soft tissue mobilization Lt teres/trap/shd   Scapular Mobilization Lt    Passive ROM shd flex/abd/ER in neutral                 PT Education - 12/02/15 0939    Education provided Yes   Education Details HEP    Person(s) Educated Patient   Methods Explanation;Demonstration;Tactile  cues;Verbal cues;Handout   Comprehension Verbalized understanding;Returned demonstration;Verbal cues required;Tactile cues required          PT Short Term Goals - 11/19/15 1327    PT SHORT TERM GOAL #1   Title Patient I In initial HEP 12/18/15   Time 4   Period Weeks   Status New   PT SHORT TERM GOAL #2  Title Improve posture and alignment for exercises and functional activities with tactile and/or verbal cues of therapist 12/31/15   Time 6   Period Weeks   Status New   PT SHORT TERM GOAL #3   Title Patient reports using Lt UE for washing face; combing hair; assisting for kitchen tasks 12/31/15   Time 6   Period Weeks   Status New   PT SHORT TERM GOAL #4   Title Increase active shoudler ROM by 10-20 degrees in all planes 12/31/15   Time 6   Period Weeks   Status New           PT Long Term Goals - 11/19/15 1330    PT LONG TERM GOAL #1   Title Patient reports using Lt UE for most normal functional activities that do not require lifting >5 lbs. 02/11/16   Time 12   Period Weeks   Status New   PT LONG TERM GOAL #2   Title AROM Lt shoudler equal or greater than Rt shoudler 02/11/16   Time 12   Period Weeks   Status New   PT LONG TERM GOAL #3   Title 4/5 to 5/5 Lt shoulder strength 02/11/16   Time 12   Period Weeks   Status New   PT LONG TERM GOAL #4   Title I in HEP 02/11/16   Time 12   Period Weeks   Status New   PT LONG TERM GOAL #5   Title Improve FOTO to </=37% limitation 02/11/16   Time 12   Period Weeks   Status New               Plan - 12/02/15 1221    Clinical Impression Statement Progressing with exercise - added gentle strengthening today without difficulty. Very tender and painful with manual work through [employer]. PROM improving. Returns to MD monday 12/07/15. note to MD at appt Friday 12/04/15.   Pt will benefit from skilled therapeutic intervention in order to improve on the following deficits Decreased range of motion;Decreased  strength;Decreased activity tolerance;Decreased endurance;Postural dysfunction;Improper body mechanics   Rehab Potential Good   PT Frequency 2x / week   PT Duration 12 weeks   PT Treatment/Interventions Patient/family education;Therapeutic activities;Therapeutic exercise;Manual techniques;Dry needling;Cryotherapy;Electrical Stimulation;Moist Heat;Ultrasound   PT Next Visit Plan DOS: 09/29/15 Patient 9 wks 1 day post Lt TSA- progress with ROM; stabilization and strengthening per protocol/as indicated    PT Home Exercise Plan postural correction; HEP    Consulted and Agree with Plan of Care Patient        Problem List Patient Active Problem List   Diagnosis Date Noted  . Osteoarthritis of left shoulder 09/29/2015  . Precordial pain   . Abnormal stress test   . Osteoarthritis of right shoulder, primary 12/16/2014  . S/P shoulder replacement 12/16/2014  . Preop cardiovascular exam 09/17/2014  . CAD (coronary artery disease) 01/29/2014  . HTN (hypertension) 01/29/2014  . Tremor 01/29/2014    Celyn Nilda Simmer PT, MPH  12/02/2015, 12:30 PM  [facility] [place], [place], [address] Phone: [phone]   Fax:  [phone]  Name: Jay Bradshaw MRN: [id number] Date of Birth: 1935-05-16

## 2015-12-02 NOTE — Patient Instructions (Signed)
Pulley  Arm straight up by ear; palms facing each other Hold 10 sec x 10 reps 3-4 x/day  Pulley with elbows out just a bit  Hold 10 sec x 10 reps 3-4 x/day   Sitting with forearm supported on the table Squeeze shoulder blades down and back Lift forearm off table about 4-5 inches Hold 2-3 sec  Lower slowly Repeat 10 times 2-3 times/day    Resisted External Rotation: in Neutral - Bilateral YELLOW BAND    PALMS UP Sit or stand, tubing in both hands, elbows at sides, bent to 90, forearms forward. Pinch shoulder blades together and rotate forearms out. Keep elbows at sides. Repeat __10__ times per set. Do _2-3___ sets per session. Do _2-3___ sessions per day.   Low Row: Standing RED BAND    Face anchor, feet shoulder width apart. Palms up, pull arms back, squeezing shoulder blades together. Repeat 10__ times per set. Do 2-3__ sets per session. Do 2-3__ sessions per week. Anchor Height: Waist   Strengthening: Resisted Extension RED BAND    Hold tubing in right hand, arm forward. Pull arm back, elbow straight. Repeat _10___ times per set. Do 2-3____ sets per session. Do 2-3____ sessions per day.  Ball on Wall: [employer] ball in circles. Do this actively with left hand on ball. Roll __30_ times, _3-4_ times per day. Keep shoulder blades down and back

## 2015-12-04 ENCOUNTER — Ambulatory Visit (INDEPENDENT_AMBULATORY_CARE_PROVIDER_SITE_OTHER): Payer: Medicare HMO | Admitting: Rehabilitative and Restorative Service Providers"

## 2015-12-04 ENCOUNTER — Encounter: Payer: Self-pay | Admitting: Rehabilitative and Restorative Service Providers"

## 2015-12-04 DIAGNOSIS — R29898 Other symptoms and signs involving the musculoskeletal system: Secondary | ICD-10-CM | POA: Diagnosis not present

## 2015-12-04 DIAGNOSIS — M623 Immobility syndrome (paraplegic): Secondary | ICD-10-CM | POA: Diagnosis not present

## 2015-12-04 DIAGNOSIS — R6889 Other general symptoms and signs: Secondary | ICD-10-CM

## 2015-12-04 DIAGNOSIS — M2560 Stiffness of unspecified joint, not elsewhere classified: Secondary | ICD-10-CM

## 2015-12-04 DIAGNOSIS — R531 Weakness: Secondary | ICD-10-CM

## 2015-12-04 DIAGNOSIS — Z7409 Other reduced mobility: Secondary | ICD-10-CM | POA: Diagnosis not present

## 2015-12-04 DIAGNOSIS — M25512 Pain in left shoulder: Secondary | ICD-10-CM | POA: Diagnosis not present

## 2015-12-04 NOTE — Therapy (Signed)
[place], [place], [address] Phone: [phone]   Fax:  [phone]  Physical Therapy Treatment  Patient Details  Name: Jay Bradshaw MRN: [id number] Date of Birth: [birth year] Referring Provider: Dr. Carter Kitten  Encounter Date: 12/04/2015      PT End of Session - 12/04/15 1108    Visit Number 3   Number of Visits 24   Date for PT Re-Evaluation 02/11/16   PT Start Time 1104   PT Stop Time 1202   PT Time Calculation (min) 58 min   Activity Tolerance Patient tolerated treatment well      Past Medical History  Diagnosis Date  . Hyperlipidemia     takes Lovastatin daily  . Arthritis   . COPD (chronic obstructive pulmonary disease) ([place])   . Shortness of breath dyspnea   . Tremors of nervous system   . Osteoarthritis of right shoulder, primary 12/16/2014  . Complication of anesthesia   . PONV (postoperative nausea and vomiting)   . Hypothyroidism     takes Synthroid daily  . Constipation     takes Colace daily as needed  . Urinary frequency     takes Ditropan daily  . Enlarged prostate     takes Tamsulosin daily  . Coronary artery disease     takes Metoprolol daily  . Emphysema lung ([place])   . Productive cough   . Balance problem   . Joint pain   . Osteoarthritis of left shoulder 09/29/2015  . Seasonal allergies     Past Surgical History  Procedure Laterality Date  . Cataract extraction  Aug and Sep 2011    first left eye, then right eye  . Colonoscopy  June 2008    Dr. Lurena Joiner ref by Ruthann Cancer  . Irrigation and debridement sebaceous cyst  2010    from the back, [employer]  . Prostate biopsy  2006    Dr. Nadara Mustard [facility]  . Lipoma excision  Nov 2004    from neck, right side (near location of mandible cyst), Dr Lorrine Kin ref by Rasalen  . Excision of fibroma  October 1994    in skin on back of neck right of midline, Dede Query ref by Lidia Collum  . Vacuolar cyst removal       in right mandible fixed/filled, Denyce Robert ref by Synetta Shadow, DDS  . Thyroid adenoma removal  August 1970    right side, O. Ptr.Schumacher/Hermann  . Hernia repair    . Shoulder surgery Right 12/16/2014    hemi-arthroplasty    dr Mardelle Matte  . Total shoulder arthroplasty Right 12/16/2014    Procedure: RIGHT TOTAL SHOULDER ARTHROPLASTY;  Surgeon: Johnny Bridge, MD;  Location: [facility];  Service: Orthopedics;  Laterality: Right;  . Shoulder hemi-arthroplasty Right 12/16/2014    Procedure: SHOULDER HEMI-ARTHROPLASTY;  Surgeon: Johnny Bridge, MD;  Location: [facility];  Service: Orthopedics;  Laterality: Right;  . Wisdom teeth extracted    . Cardiac catheterization N/A 09/25/2015    Procedure: Left Heart Cath and Coronary Angiography;  Surgeon: Burnell Blanks, MD;  Location: [facility];  Service: Cardiovascular;  Laterality: N/A;  . Total shoulder arthroplasty Left 09/29/2015  . Total shoulder arthroplasty Left 09/29/2015    Procedure: TOTAL LEFT SHOULDER ARTHROPLASTY;  Surgeon: Marchia Bond, MD;  Location: [facility];  Service: Orthopedics;  Laterality: Left;    There were no vitals filed for this visit.  Visit Diagnosis:  Left shoulder pain  Weakness of left upper extremity  Stiffness due to immobility  Decreased strength, endurance, and mobility      Subjective Assessment - 12/04/15 1107    Subjective Jay Bradshaw reports that he can tell he has some improvement in mobility in the Lt shoulder following treatment and HEP. He has some pain/discomfort on an intermittent basis.    Pertinent History Rt hemi-arthroplasty 12/16/14 ; osteoporesis    Currently in Pain? Yes   Pain Score 2    Pain Location Shoulder   Pain Orientation Left   Pain Descriptors / Indicators Aching   Pain Type Chronic pain   Pain Onset More than a month ago   Pain Frequency Intermittent   Aggravating Factors  moving shoulder; using UE   Pain Relieving Factors rest; sleep             [facility] PT  Assessment - 12/04/15 0001    Assessment   Medical Diagnosis Lt TSA   Referring Provider Dr. Carter Kitten   Onset Date/Surgical Date 09/29/15   Hand Dominance Right   Next MD Visit 12/07/15   Prior Therapy for Rt shoulder    AROM   Overall AROM  --  AROM assessed in standing; UE supported ~ 50 d abd for IR/ER   Left Shoulder Extension 40 Degrees   Left Shoulder Flexion 96 Degrees   Left Shoulder ABduction 75 Degrees   Left Shoulder Internal Rotation 21 Degrees   Left Shoulder External Rotation 39 Degrees   PROM   Overall PROM Comments PROM assessed in supine; shd iin scaption for abd/IR/ER    Left Shoulder Flexion 134 Degrees   Left Shoulder ABduction 120 Degrees   Left Shoulder Internal Rotation 38 Degrees   Left Shoulder External Rotation 31 Degrees                     [facility] Adult PT Treatment/Exercise - 12/04/15 0001    Shoulder Exercises: Seated   Other Seated Exercises initiation of deltoid 2 sec x 10    Shoulder Exercises: Standing   External Rotation Both;10 reps;Theraband  with noodle    Theraband Level (Shoulder External Rotation) Level 1 (Yellow)   Extension Both;10 reps;Theraband   Theraband Level (Shoulder Extension) Level 2 (Red)   Row Both;10 reps;Theraband   Theraband Level (Shoulder Row) Level 2 (Red)   Other Standing Exercises scap squeeze with noodle 10 sec x 10    Shoulder Exercises: Pulleys   Flexion Limitations 10 sec hold x 10 reps    ABduction Limitations 10 sec x 5    Shoulder Exercises: Therapy Ball   Other Therapy Ball Exercises ball on wall CW/CCW    Shoulder Exercises: Stretch   External Rotation Stretch --  with cane 10 sec x 10 standing    Table Stretch - Flexion --  10 sec x 10    Other Shoulder Stretches supine snow angel    Moist Heat Therapy   Number Minutes Moist Heat 15 Minutes   Moist Heat Location Shoulder  LT   Electrical Stimulation   Electrical Stimulation Location Lt shd    Electrical Stimulation Action IFC    Electrical Stimulation Parameters to tolerance   Electrical Stimulation Goals Pain   Manual Therapy   Soft tissue mobilization Lt teres/trap/pecs   Scapular Mobilization Lt    Passive ROM shd flex/abd/ER in neutral                 PT Education - 12/04/15 1141  Education provided Yes   Education Details HEP   Person(s) Educated Patient   Methods Explanation   Comprehension Verbalized understanding          PT Short Term Goals - 12/04/15 1116    PT SHORT TERM GOAL #1   Title Patient I In initial HEP 12/18/15   Time 4   Period Weeks   Status On-going   PT SHORT TERM GOAL #2   Title Improve posture and alignment for exercises and functional activities with tactile and/or verbal cues of therapist 12/31/15   Time 6   Period Weeks   Status On-going   PT SHORT TERM GOAL #3   Title Patient reports using Lt UE for washing face; combing hair; assisting for kitchen tasks 12/31/15   Time 6   Period Weeks   Status On-going   PT SHORT TERM GOAL #4   Title Increase active shoudler ROM by 10-20 degrees in all planes 12/31/15   Time 6   Period Weeks   Status On-going           PT Long Term Goals - 12/04/15 1116    PT LONG TERM GOAL #1   Title Patient reports using Lt UE for most normal functional activities that do not require lifting >5 lbs. 02/11/16   Time 12   Period Weeks   Status On-going   PT LONG TERM GOAL #2   Title AROM Lt shoudler equal or greater than Rt shoudler 02/11/16   Time 12   Period Weeks   Status On-going   PT LONG TERM GOAL #3   Title 4/5 to 5/5 Lt shoulder strength 02/11/16   Time 12   Period Weeks   Status On-going   PT LONG TERM GOAL #4   Title I in HEP 02/11/16   Time 12   Period Weeks   Status On-going   PT LONG TERM GOAL #5   Title Improve FOTO to </=37% limitation 02/11/16   Time 12   Period Weeks   Status On-going               Plan - 12/04/15 1109    Clinical Impression Statement Jay Bradshaw is progressing well with exercise in  the clinic and at home. He has only intermittent pain or discomfort. Continues to have tenderness through teres/tightness through pecs and upper traps. PROM/AROM improving. Please send shoulder protocol for rehab.    Pt will benefit from skilled therapeutic intervention in order to improve on the following deficits Decreased range of motion;Decreased strength;Decreased activity tolerance;Decreased endurance;Postural dysfunction;Improper body mechanics   Rehab Potential Good   PT Frequency 2x / week   PT Duration 12 weeks   PT Treatment/Interventions Patient/family education;Therapeutic activities;Therapeutic exercise;Manual techniques;Dry needling;Cryotherapy;Electrical Stimulation;Moist Heat;Ultrasound   PT Next Visit Plan DOS: 09/29/15 Patient 9 wks 3 day post Lt TSA- progress with ROM; stabilization and strengthening per protocol/as indicated    PT Home Exercise Plan postural correction; HEP    Consulted and Agree with Plan of Care Patient        Problem List Patient Active Problem List   Diagnosis Date Noted  . Osteoarthritis of left shoulder 09/29/2015  . Precordial pain   . Abnormal stress test   . Osteoarthritis of right shoulder, primary 12/16/2014  . S/P shoulder replacement 12/16/2014  . Preop cardiovascular exam 09/17/2014  . CAD (coronary artery disease) 01/29/2014  . HTN (hypertension) 01/29/2014  . Tremor 01/29/2014    Celyn Nilda Simmer PT, MPH  12/04/2015, 11:42  [place], [place], [address] Phone: [phone]   Fax:  [phone]  Name: Jay Bradshaw MRN: [id number] Date of Birth: 1934-12-09

## 2015-12-08 ENCOUNTER — Encounter: Payer: Medicare HMO | Admitting: Rehabilitative and Restorative Service Providers"

## 2015-12-08 DIAGNOSIS — M19012 Primary osteoarthritis, left shoulder: Secondary | ICD-10-CM | POA: Diagnosis not present

## 2015-12-09 ENCOUNTER — Encounter: Payer: Self-pay | Admitting: Rehabilitative and Restorative Service Providers"

## 2015-12-09 ENCOUNTER — Ambulatory Visit (INDEPENDENT_AMBULATORY_CARE_PROVIDER_SITE_OTHER): Payer: Medicare HMO | Admitting: Rehabilitative and Restorative Service Providers"

## 2015-12-09 ENCOUNTER — Telehealth: Payer: Self-pay

## 2015-12-09 ENCOUNTER — Telehealth: Payer: Self-pay | Admitting: Cardiovascular Disease

## 2015-12-09 DIAGNOSIS — Z7409 Other reduced mobility: Secondary | ICD-10-CM

## 2015-12-09 DIAGNOSIS — M2560 Stiffness of unspecified joint, not elsewhere classified: Secondary | ICD-10-CM

## 2015-12-09 DIAGNOSIS — M623 Immobility syndrome (paraplegic): Secondary | ICD-10-CM

## 2015-12-09 DIAGNOSIS — R29898 Other symptoms and signs involving the musculoskeletal system: Secondary | ICD-10-CM

## 2015-12-09 DIAGNOSIS — M25512 Pain in left shoulder: Secondary | ICD-10-CM | POA: Diagnosis not present

## 2015-12-09 DIAGNOSIS — R531 Weakness: Secondary | ICD-10-CM

## 2015-12-09 NOTE — Telephone Encounter (Signed)
Walk in pt form-patient has questions about Stent-please call gave to Pam P

## 2015-12-09 NOTE — Therapy (Signed)
[place], [place], [address] Phone: [phone]   Fax:  [phone]  Physical Therapy Treatment  Patient Details  Name: Jay Bradshaw MRN: [id number] Date of Birth: [birth year] Referring Provider: Dr. Carter Kitten  Encounter Date: 12/09/2015      PT End of Session - 12/09/15 1104    Visit Number 4   Number of Visits 24   Date for PT Re-Evaluation 02/11/16   PT Start Time 1103   PT Stop Time 1200   PT Time Calculation (min) 57 min   Activity Tolerance Patient tolerated treatment well      Past Medical History  Diagnosis Date  . Hyperlipidemia     takes Lovastatin daily  . Arthritis   . COPD (chronic obstructive pulmonary disease) ([place])   . Shortness of breath dyspnea   . Tremors of nervous system   . Osteoarthritis of right shoulder, primary 12/16/2014  . Complication of anesthesia   . PONV (postoperative nausea and vomiting)   . Hypothyroidism     takes Synthroid daily  . Constipation     takes Colace daily as needed  . Urinary frequency     takes Ditropan daily  . Enlarged prostate     takes Tamsulosin daily  . Coronary artery disease     takes Metoprolol daily  . Emphysema lung ([place])   . Productive cough   . Balance problem   . Joint pain   . Osteoarthritis of left shoulder 09/29/2015  . Seasonal allergies     Past Surgical History  Procedure Laterality Date  . Cataract extraction  Aug and Sep 2011    first left eye, then right eye  . Colonoscopy  June 2008    Dr. Lurena Joiner ref by Ruthann Cancer  . Irrigation and debridement sebaceous cyst  2010    from the back, [employer]  . Prostate biopsy  2006    Dr. Nadara Mustard Minott  . Lipoma excision  Nov 2004    from neck, right side (near location of mandible cyst), Dr Lorrine Kin ref by Rasalen  . Excision of fibroma  October 1994    in skin on back of neck right of midline, Dede Query ref by Lidia Collum  . Vacuolar cyst removal       in right mandible fixed/filled, Denyce Robert ref by Synetta Shadow, DDS  . Thyroid adenoma removal  August 1970    right side, O. Ptr.Schumacher/Hermann  . Hernia repair    . Shoulder surgery Right 12/16/2014    hemi-arthroplasty    dr Mardelle Matte  . Total shoulder arthroplasty Right 12/16/2014    Procedure: RIGHT TOTAL SHOULDER ARTHROPLASTY;  Surgeon: Johnny Bridge, MD;  Location: [facility];  Service: Orthopedics;  Laterality: Right;  . Shoulder hemi-arthroplasty Right 12/16/2014    Procedure: SHOULDER HEMI-ARTHROPLASTY;  Surgeon: Johnny Bridge, MD;  Location: [facility];  Service: Orthopedics;  Laterality: Right;  . Wisdom teeth extracted    . Cardiac catheterization N/A 09/25/2015    Procedure: Left Heart Cath and Coronary Angiography;  Surgeon: Burnell Blanks, MD;  Location: [facility];  Service: Cardiovascular;  Laterality: N/A;  . Total shoulder arthroplasty Left 09/29/2015  . Total shoulder arthroplasty Left 09/29/2015    Procedure: TOTAL LEFT SHOULDER ARTHROPLASTY;  Surgeon: Marchia Bond, MD;  Location: [facility];  Service: Orthopedics;  Laterality: Left;    There were no vitals filed for this visit.  Visit Diagnosis:  Left shoulder pain  Weakness of left upper extremity  Stiffness due to immobility  Decreased strength, endurance, and mobility      Subjective Assessment - 12/09/15 1109    Subjective Jay Bradshaw reports that he was seen by MD since last visit. MD was pleased with progress and wants pt to continue with rehab to improve ROM/ strength. Jay Bradshaw reports that he still can't reach up with Lt hand. He can't lift his medicine box/dishes out of the cabinet.    Currently in Pain? Yes   Pain Score 1    Pain Location Shoulder   Pain Orientation Left   Pain Descriptors / Indicators Aching   Pain Type Chronic pain   Pain Onset More than a month ago   Pain Frequency Intermittent   Aggravating Factors  moving shoulder; using Lt UE   Pain Relieving Factors rest; sleep                           [facility] Adult PT Treatment/Exercise - 12/09/15 0001    Shoulder Exercises: Supine   Other Supine Exercises punch to ceiling x10 1#    Other Supine Exercises stabilzation circles CW/CCW shhd at 90 deg abd 1# wt    Shoulder Exercises: Seated   Other Seated Exercises initiation of deltoid 2 sec x 10    Shoulder Exercises: Standing   External Rotation Both;Theraband;20 reps  with noodle    Theraband Level (Shoulder External Rotation) Level 1 (Yellow)   Extension Both;Theraband;20 reps   Theraband Level (Shoulder Extension) Level 2 (Red)   Row Both;Theraband;20 reps   Theraband Level (Shoulder Row) Level 2 (Red)   Other Standing Exercises scap squeeze with noodle 10 sec x 10    Other Standing Exercises standing scaption 1# Rt/0# Lt 10 x2    Shoulder Exercises: Pulleys   Flexion Limitations 10 sec hold x 10 reps    ABduction Limitations 10 sec x 5    Shoulder Exercises: Therapy Ball   Other Therapy Ball Exercises ball on wall CW/CCW 30-60 sec x4 reps    Shoulder Exercises: Stretch   External Rotation Stretch --  with cane 10 sec x 10 standing    Wall Stretch - Flexion 4 reps;10 seconds  wall ladder - 29   Table Stretch - Flexion --  10 sec x 10    Moist Heat Therapy   Number Minutes Moist Heat 15 Minutes   Moist Heat Location Shoulder  LT   Electrical Stimulation   Electrical Stimulation Location Lt shd    Electrical Stimulation Action IFC   Electrical Stimulation Parameters to tolerance   Electrical Stimulation Goals Pain   Manual Therapy   Soft tissue mobilization Lt teres/trap/pecs   Scapular Mobilization Lt    Passive ROM shd flex/abd/ER in neutral                 PT Education - 12/09/15 1206    Education provided Yes   Education Details reviewed HEP encouraged continued work on [employer]) Educated Patient   Methods Explanation   Comprehension Verbalized understanding          PT Short Term Goals -  12/04/15 1116    PT SHORT TERM GOAL #1   Title Patient I In initial HEP 12/18/15   Time 4   Period Weeks   Status On-going   PT SHORT TERM GOAL #2   Title Improve posture and alignment for exercises and functional activities with tactile and/or  verbal cues of therapist 12/31/15   Time 6   Period Weeks   Status On-going   PT SHORT TERM GOAL #3   Title Patient reports using Lt UE for washing face; combing hair; assisting for kitchen tasks 12/31/15   Time 6   Period Weeks   Status On-going   PT SHORT TERM GOAL #4   Title Increase active shoudler ROM by 10-20 degrees in all planes 12/31/15   Time 6   Period Weeks   Status On-going           PT Long Term Goals - 12/04/15 1116    PT LONG TERM GOAL #1   Title Patient reports using Lt UE for most normal functional activities that do not require lifting >5 lbs. 02/11/16   Time 12   Period Weeks   Status On-going   PT LONG TERM GOAL #2   Title AROM Lt shoudler equal or greater than Rt shoudler 02/11/16   Time 12   Period Weeks   Status On-going   PT LONG TERM GOAL #3   Title 4/5 to 5/5 Lt shoulder strength 02/11/16   Time 12   Period Weeks   Status On-going   PT LONG TERM GOAL #4   Title I in HEP 02/11/16   Time 12   Period Weeks   Status On-going   PT LONG TERM GOAL #5   Title Improve FOTO to </=37% limitation 02/11/16   Time 12   Period Weeks   Status On-going               Plan - 12/09/15 1105    Clinical Impression Statement Jay Bradshaw demonstrates continued gradual improvement in Lt shoulder. He was seen by MD - who was pleased with progress. Continued tightness and tenderness in teres/pecs/upper trap. ROM improving. Progressing toward stated goals of therapy.    Pt will benefit from skilled therapeutic intervention in order to improve on the following deficits Decreased range of motion;Decreased strength;Decreased activity tolerance;Decreased endurance;Postural dysfunction;Improper body mechanics   Rehab Potential Good    PT Frequency 2x / week   PT Duration 12 weeks   PT Treatment/Interventions Patient/family education;Therapeutic activities;Therapeutic exercise;Manual techniques;Dry needling;Cryotherapy;Electrical Stimulation;Moist Heat;Ultrasound   PT Next Visit Plan DOS: 09/29/15 Patient 9 wks 3 day post Lt TSA- progress with ROM; stabilization and strengthening per protocol/as indicated    PT Home Exercise Plan postural correction; HEP    Consulted and Agree with Plan of Care Patient        Problem List Patient Active Problem List   Diagnosis Date Noted  . Osteoarthritis of left shoulder 09/29/2015  . Precordial pain   . Abnormal stress test   . Osteoarthritis of right shoulder, primary 12/16/2014  . S/P shoulder replacement 12/16/2014  . Preop cardiovascular exam 09/17/2014  . CAD (coronary artery disease) 01/29/2014  . HTN (hypertension) 01/29/2014  . Tremor 01/29/2014    Jay Bradshaw Nilda Simmer PT, MPH  12/09/2015, 12:07 PM  [facility] [place], [place], [address] Phone: [phone]   Fax:  [phone]  Name: Myca Laos MRN: [id number] Date of Birth: Sep 19, 1935

## 2015-12-09 NOTE — Telephone Encounter (Signed)
Patient wanting to know if he needs another heart cath. Patient's last heart cath. On 10/28 by Dr. Angelena Form  See below  Conclusion     Prox RCA lesion, 20% stenosed.  Mid RCA lesion, 20% stenosed.  Dist LAD lesion, 20% stenosed.  The left ventricular systolic function is normal.  1st Mrg lesion, 60% stenosed. The vessel is small to moderate in size. The stenosis is eccentric and does not appear to be flow limiting.  Recommendations: He has a moderate eccentric stenosis in the first OM branch. This is a small to medium sized vessel. The lesion does not appear to be flow limiting. With upcoming surgery, we have discussed medical management of his CAD for now. He would prefer to attempt medical management for now. Will proceed with surgery next week. If he has chest pain c/w angina going forward, could bring him back for re-look cath and PCI of the OM which would be technically easy to do.    Informed patient that as long as he is not having any chest pain that his CAD is being medically managed. Encouraged patient to call office if he has any other concerns, and that to follow-up with Dr. Johnsie Cancel as discussed at his last office visit.

## 2015-12-11 ENCOUNTER — Ambulatory Visit (INDEPENDENT_AMBULATORY_CARE_PROVIDER_SITE_OTHER): Payer: Medicare HMO | Admitting: Rehabilitative and Restorative Service Providers"

## 2015-12-11 ENCOUNTER — Encounter: Payer: Self-pay | Admitting: Rehabilitative and Restorative Service Providers"

## 2015-12-11 DIAGNOSIS — R29898 Other symptoms and signs involving the musculoskeletal system: Secondary | ICD-10-CM | POA: Diagnosis not present

## 2015-12-11 DIAGNOSIS — R531 Weakness: Secondary | ICD-10-CM

## 2015-12-11 DIAGNOSIS — M623 Immobility syndrome (paraplegic): Secondary | ICD-10-CM | POA: Diagnosis not present

## 2015-12-11 DIAGNOSIS — M25512 Pain in left shoulder: Secondary | ICD-10-CM | POA: Diagnosis not present

## 2015-12-11 DIAGNOSIS — M2560 Stiffness of unspecified joint, not elsewhere classified: Secondary | ICD-10-CM

## 2015-12-11 DIAGNOSIS — R6889 Other general symptoms and signs: Secondary | ICD-10-CM

## 2015-12-11 DIAGNOSIS — Z7409 Other reduced mobility: Secondary | ICD-10-CM | POA: Diagnosis not present

## 2015-12-11 NOTE — Therapy (Signed)
[place], [place], [address] Phone: [phone]   Fax:  [phone]  Physical Therapy Treatment  Patient Details  Name: Jay Bradshaw MRN: [id number] Date of Birth: November 02, 1935 Referring Provider: Dr. Carter Kitten  Encounter Date: 12/11/2015      PT End of Session - 12/11/15 1110    Visit Number 5   Number of Visits 24   Date for PT Re-Evaluation 02/11/16   PT Start Time 1101   PT Stop Time 1158   PT Time Calculation (min) 57 min   Activity Tolerance Patient tolerated treatment well      Past Medical History  Diagnosis Date  . Hyperlipidemia     takes Lovastatin daily  . Arthritis   . COPD (chronic obstructive pulmonary disease) ([place])   . Shortness of breath dyspnea   . Tremors of nervous system   . Osteoarthritis of right shoulder, primary 12/16/2014  . Complication of anesthesia   . PONV (postoperative nausea and vomiting)   . Hypothyroidism     takes Synthroid daily  . Constipation     takes Colace daily as needed  . Urinary frequency     takes Ditropan daily  . Enlarged prostate     takes Tamsulosin daily  . Coronary artery disease     takes Metoprolol daily  . Emphysema lung ([place])   . Productive cough   . Balance problem   . Joint pain   . Osteoarthritis of left shoulder 09/29/2015  . Seasonal allergies     Past Surgical History  Procedure Laterality Date  . Cataract extraction  Aug and Sep 2011    first left eye, then right eye  . Colonoscopy  June 2008    Dr. Lurena Joiner ref by Ruthann Cancer  . Irrigation and debridement sebaceous cyst  2010    from the back, [employer]  . Prostate biopsy  2006    Dr. Nadara Mustard [facility]  . Lipoma excision  Nov 2004    from neck, right side (near location of mandible cyst), Dr Lorrine Kin ref by Rasalen  . Excision of fibroma  October 1994    in skin on back of neck right of midline, Dede Query ref by Lidia Collum  . Vacuolar cyst removal       in right mandible fixed/filled, Denyce Robert ref by Synetta Shadow, DDS  . Thyroid adenoma removal  August 1970    right side, O. Ptr.Schumacher/Hermann  . Hernia repair    . Shoulder surgery Right 12/16/2014    hemi-arthroplasty    dr Mardelle Matte  . Total shoulder arthroplasty Right 12/16/2014    Procedure: RIGHT TOTAL SHOULDER ARTHROPLASTY;  Surgeon: Johnny Bridge, MD;  Location: [facility];  Service: Orthopedics;  Laterality: Right;  . Shoulder hemi-arthroplasty Right 12/16/2014    Procedure: SHOULDER HEMI-ARTHROPLASTY;  Surgeon: Johnny Bridge, MD;  Location: [facility];  Service: Orthopedics;  Laterality: Right;  . Wisdom teeth extracted    . Cardiac catheterization N/A 09/25/2015    Procedure: Left Heart Cath and Coronary Angiography;  Surgeon: Burnell Blanks, MD;  Location: [facility];  Service: Cardiovascular;  Laterality: N/A;  . Total shoulder arthroplasty Left 09/29/2015  . Total shoulder arthroplasty Left 09/29/2015    Procedure: TOTAL LEFT SHOULDER ARTHROPLASTY;  Surgeon: Marchia Bond, MD;  Location: [facility];  Service: Orthopedics;  Laterality: Left;    There were no vitals filed for this visit.  Visit Diagnosis:  Left shoulder pain  Weakness of left upper extremity  Stiffness due to immobility  Decreased strength, endurance, and mobility      Subjective Assessment - 12/11/15 1111    Subjective Doing OK with exercises - shoulder is coming along....   Currently in Pain? Yes   Pain Score 1    Pain Location Shoulder   Pain Orientation Left   Pain Descriptors / Indicators Aching   Pain Type Chronic pain   Pain Onset More than a month ago   Pain Frequency Intermittent                         [facility] Adult PT Treatment/Exercise - 12/11/15 0001    Shoulder Exercises: Standing   Extension Both;Theraband;20 reps   Theraband Level (Shoulder Extension) Level 2 (Red)   Row Both;Theraband;20 reps   Theraband Level (Shoulder Row) Level 2 (Red)   Other  Standing Exercises scap squeeze with noodle 10 sec x 10    Other Standing Exercises standing scaption 1# Rt/1# Lt 10 x2    Shoulder Exercises: Pulleys   Flexion Limitations 10 sec hold x 10 reps    ABduction Limitations 10 sec x 5    Shoulder Exercises: Therapy Ball   Flexion Limitations trial stepping under large ball for ROM 2 trials Increased discomfort    Other Therapy Ball Exercises ball on wall CW/CCW 30-60 sec x4 reps    Shoulder Exercises: ROM/Strengthening   Wall Pushups 10 reps   Shoulder Exercises: Isometric Strengthening   ABduction --  standing 5 sec hold x10   Shoulder Exercises: Stretch   External Rotation Stretch --  with cane 10 sec x 10 standing    Wall Stretch - Flexion 4 reps;10 seconds  wall ladder - 29   Table Stretch - Flexion --  10 sec x 10    Moist Heat Therapy   Number Minutes Moist Heat 15 Minutes   Moist Heat Location Shoulder  LT   Electrical Stimulation   Electrical Stimulation Location --   Electrical Stimulation Action --   Electrical Stimulation Parameters --   Electrical Stimulation Goals --   Manual Therapy   Soft tissue mobilization Lt teres/trap/pecs   Scapular Mobilization Lt    Passive ROM shd flex/abd/ER in neutral                 PT Education - 12/11/15 1135    Education provided Yes   Education Details HEP   Person(s) Educated Patient   Methods Explanation;Demonstration;Tactile cues;Verbal cues;Handout   Comprehension Verbalized understanding;Returned demonstration;Verbal cues required;Tactile cues required          PT Short Term Goals - 12/04/15 1116    PT SHORT TERM GOAL #1   Title Patient I In initial HEP 12/18/15   Time 4   Period Weeks   Status On-going   PT SHORT TERM GOAL #2   Title Improve posture and alignment for exercises and functional activities with tactile and/or verbal cues of therapist 12/31/15   Time 6   Period Weeks   Status On-going   PT SHORT TERM GOAL #3   Title Patient reports using Lt UE  for washing face; combing hair; assisting for kitchen tasks 12/31/15   Time 6   Period Weeks   Status On-going   PT SHORT TERM GOAL #4   Title Increase active shoudler ROM by 10-20 degrees in all planes 12/31/15   Time 6   Period Weeks   Status On-going  PT Long Term Goals - 12/04/15 1116    PT LONG TERM GOAL #1   Title Patient reports using Lt UE for most normal functional activities that do not require lifting >5 lbs. 02/11/16   Time 12   Period Weeks   Status On-going   PT LONG TERM GOAL #2   Title AROM Lt shoudler equal or greater than Rt shoudler 02/11/16   Time 12   Period Weeks   Status On-going   PT LONG TERM GOAL #3   Title 4/5 to 5/5 Lt shoulder strength 02/11/16   Time 12   Period Weeks   Status On-going   PT LONG TERM GOAL #4   Title I in HEP 02/11/16   Time 12   Period Weeks   Status On-going   PT LONG TERM GOAL #5   Title Improve FOTO to </=37% limitation 02/11/16   Time 12   Period Weeks   Status On-going               Plan - 12/11/15 1135    Clinical Impression Statement Continued progress toward stated goals of therapy - gradually progressing with ROM and strengthening/stabilization.   Pt will benefit from skilled therapeutic intervention in order to improve on the following deficits Decreased range of motion;Decreased strength;Decreased activity tolerance;Decreased endurance;Postural dysfunction;Improper body mechanics   Rehab Potential Good   PT Frequency 2x / week   PT Duration 12 weeks   PT Treatment/Interventions Patient/family education;Therapeutic activities;Therapeutic exercise;Manual techniques;Dry needling;Cryotherapy;Electrical Stimulation;Moist Heat;Ultrasound   PT Next Visit Plan DOS: 09/29/15 Patient 10 wks 3 day post Lt TSA- progress with ROM; stabilization and strengthening per protocol/as indicated    PT Home Exercise Plan postural correction; HEP    Consulted and Agree with Plan of Care Patient        Problem  List Patient Active Problem List   Diagnosis Date Noted  . Osteoarthritis of left shoulder 09/29/2015  . Precordial pain   . Abnormal stress test   . Osteoarthritis of right shoulder, primary 12/16/2014  . S/P shoulder replacement 12/16/2014  . Preop cardiovascular exam 09/17/2014  . CAD (coronary artery disease) 01/29/2014  . HTN (hypertension) 01/29/2014  . Tremor 01/29/2014    Audon Heymann Nilda Simmer PT, MPH  12/11/2015, 11:57 AM  [facility] [place], [place], [address] Phone: [phone]   Fax:  [phone]  Name: Jay Bradshaw MRN: [id number] Date of Birth: 05-Dec-1934

## 2015-12-11 NOTE — Patient Instructions (Signed)
Standing with noodle along back Squeeze shoulder blades down and back Lift 1 # weight to just below shoulder level 20-30 reps   ELBOW: Wall Push-Up    With arms slightly wider than shoulders, gently lean body to wall. Push body away from wall by straightening elbows. _20-30__ reps per set, _1-2__ sets per day.

## 2015-12-15 ENCOUNTER — Ambulatory Visit (INDEPENDENT_AMBULATORY_CARE_PROVIDER_SITE_OTHER): Payer: Medicare HMO | Admitting: Rehabilitative and Restorative Service Providers"

## 2015-12-15 ENCOUNTER — Encounter: Payer: Self-pay | Admitting: Rehabilitative and Restorative Service Providers"

## 2015-12-15 DIAGNOSIS — R29898 Other symptoms and signs involving the musculoskeletal system: Secondary | ICD-10-CM

## 2015-12-15 DIAGNOSIS — Z7409 Other reduced mobility: Secondary | ICD-10-CM

## 2015-12-15 DIAGNOSIS — M623 Immobility syndrome (paraplegic): Secondary | ICD-10-CM | POA: Diagnosis not present

## 2015-12-15 DIAGNOSIS — M25512 Pain in left shoulder: Secondary | ICD-10-CM | POA: Diagnosis not present

## 2015-12-15 DIAGNOSIS — M2560 Stiffness of unspecified joint, not elsewhere classified: Secondary | ICD-10-CM

## 2015-12-15 DIAGNOSIS — R531 Weakness: Secondary | ICD-10-CM

## 2015-12-15 NOTE — Patient Instructions (Signed)
Strengthening: Resisted External Rotation   Keep forearm straight  Hold tubing in left hand, elbow at side and forearm across body. Rotate forearm out. Repeat __10__ times per set. Do __1-3__ sets per session. Do __1-2__ sessions per day.

## 2015-12-15 NOTE — Therapy (Signed)
[place], [place], [address] Phone: [phone]   Fax:  [phone]  Physical Therapy Treatment  Patient Details  Name: Jay Bradshaw MRN: [id number] Date of Birth: 03/26/1935 Referring Provider: Carter Kitten  Encounter Date: 12/15/2015      PT End of Session - 12/15/15 1103    Visit Number 6   Number of Visits 24   Date for PT Re-Evaluation 02/11/16   PT Start Time 1103   PT Stop Time [id number]   PT Time Calculation (min) 53 min   Activity Tolerance Patient tolerated treatment well      Past Medical History  Diagnosis Date  . Hyperlipidemia     takes Lovastatin daily  . Arthritis   . COPD (chronic obstructive pulmonary disease) ([place])   . Shortness of breath dyspnea   . Tremors of nervous system   . Osteoarthritis of right shoulder, primary 12/16/2014  . Complication of anesthesia   . PONV (postoperative nausea and vomiting)   . Hypothyroidism     takes Synthroid daily  . Constipation     takes Colace daily as needed  . Urinary frequency     takes Ditropan daily  . Enlarged prostate     takes Tamsulosin daily  . Coronary artery disease     takes Metoprolol daily  . Emphysema lung ([place])   . Productive cough   . Balance problem   . Joint pain   . Osteoarthritis of left shoulder 09/29/2015  . Seasonal allergies     Past Surgical History  Procedure Laterality Date  . Cataract extraction  Aug and Sep 2011    first left eye, then right eye  . Colonoscopy  June 2008    Dr. Lurena Joiner ref by Ruthann Cancer  . Irrigation and debridement sebaceous cyst  2010    from the back, [employer]  . Prostate biopsy  2006    Dr. Nadara Mustard [place]  . Lipoma excision  Nov 2004    from neck, right side (near location of mandible cyst), Dr Lorrine Kin ref by Rasalen  . Excision of fibroma  October 1994    in skin on back of neck right of midline, Dede Query ref by Lidia Collum  . Vacuolar cyst removal     in right mandible fixed/filled, Denyce Robert ref by Synetta Shadow, DDS  . Thyroid adenoma removal  August 1970    right side, [facility]  . Hernia repair    . Shoulder surgery Right 12/16/2014    hemi-arthroplasty    dr Mardelle Matte  . Total shoulder arthroplasty Right 12/16/2014    Procedure: RIGHT TOTAL SHOULDER ARTHROPLASTY;  Surgeon: Johnny Bridge, MD;  Location: [facility];  Service: Orthopedics;  Laterality: Right;  . Shoulder hemi-arthroplasty Right 12/16/2014    Procedure: SHOULDER HEMI-ARTHROPLASTY;  Surgeon: Johnny Bridge, MD;  Location: [facility];  Service: Orthopedics;  Laterality: Right;  . Wisdom teeth extracted    . Cardiac catheterization N/A 09/25/2015    Procedure: Left Heart Cath and Coronary Angiography;  Surgeon: Burnell Blanks, MD;  Location: [facility];  Service: Cardiovascular;  Laterality: N/A;  . Total shoulder arthroplasty Left 09/29/2015  . Total shoulder arthroplasty Left 09/29/2015    Procedure: TOTAL LEFT SHOULDER ARTHROPLASTY;  Surgeon: Marchia Bond, MD;  Location: [facility];  Service: Orthopedics;  Laterality: Left;    There were no vitals filed for this visit.  Visit Diagnosis:  Left shoulder pain  Weakness of  left upper extremity  Stiffness due to immobility  Decreased strength, endurance, and mobility      Subjective Assessment - 12/15/15 1110    Subjective Using Lt UE for more functional activities. He can put clothes in and out of the washer/dryer; wash face; comb hair; and perform some simple chores in the kitchen.    Currently in Pain? Yes   Pain Score 1    Pain Location Shoulder   Pain Orientation Left   Pain Descriptors / Indicators Aching   Pain Type Chronic pain   Pain Onset More than a month ago            [facility] PT Assessment - 12/15/15 0001    Assessment   Medical Diagnosis Lt TSA   Referring Provider Carter Kitten   Onset Date/Surgical Date 09/29/15   Hand Dominance Right   Next MD Visit 12/07/15   Prior  Therapy for Rt shoulder    PROM   Left Shoulder Flexion 134 Degrees   Left Shoulder ABduction 120 Degrees   Left Shoulder Internal Rotation 37 Degrees   Left Shoulder External Rotation 36 Degrees                     [facility] Adult PT Treatment/Exercise - 12/15/15 0001    Shoulder Exercises: Standing   External Rotation Both;Theraband;20 reps;Left  ER bilat and then with Lt UE only    Theraband Level (Shoulder External Rotation) Level 1 (Yellow)   Extension Both;Theraband;20 reps   Theraband Level (Shoulder Extension) Level 3 (Green)   Row Both;Theraband;20 reps   Theraband Level (Shoulder Row) Level 3 (Green)   Other Standing Exercises scap squeeze with noodle 10 sec x 10    Other Standing Exercises standing scaption 1# Rt/1# Lt 10 x2    Shoulder Exercises: Pulleys   Flexion Limitations 10 sec hold x 10 reps    ABduction Limitations 10 sec x 5    Shoulder Exercises: Therapy Ball   Other Therapy Ball Exercises ball on wall CW/CCW 30-60 sec x4 reps    Other Therapy Ball Exercises bouncing ball on wall - chest level    Shoulder Exercises: ROM/Strengthening   Wall Pushups 10 reps   Shoulder Exercises: Stretch   External Rotation Stretch --  with cane 10 sec x 10 standing    Wall Stretch - Flexion 4 reps;10 seconds  wall ladder - 29   Table Stretch - Flexion --  10 sec x 10    Moist Heat Therapy   Number Minutes Moist Heat 15 Minutes   Moist Heat Location Shoulder  LT   Manual Therapy   Soft tissue mobilization Lt teres/trap/pecs   Scapular Mobilization Lt    Passive ROM shd flex/abd/ER in neutral                 PT Education - 12/15/15 1141    Education provided Yes   Education Details HEP; issued green TB for home    Person(s) Educated Patient   Methods Explanation;Demonstration;Tactile cues;Handout;Verbal cues   Comprehension Verbalized understanding;Returned demonstration;Verbal cues required;Tactile cues required          PT Short Term Goals -  12/15/15 1106    PT SHORT TERM GOAL #1   Title Patient I In initial HEP 12/18/15   Time 4   Period Weeks   Status Achieved   PT SHORT TERM GOAL #2   Title Improve posture and alignment for exercises and functional activities with tactile and/or verbal  cues of therapist 12/31/15   Time 6   Period Weeks   Status On-going   PT SHORT TERM GOAL #3   Title Patient reports using Lt UE for washing face; combing hair; assisting for kitchen tasks 12/31/15   Time 6   Period Weeks   Status Achieved   PT SHORT TERM GOAL #4   Title Increase active shoudler ROM by 10-20 degrees in all planes 12/31/15   Time 6   Period Weeks   Status On-going           PT Long Term Goals - 12/15/15 1107    PT LONG TERM GOAL #1   Title Patient reports using Lt UE for most normal functional activities that do not require lifting >5 lbs. 02/11/16   Time 12   Period Weeks   Status On-going   PT LONG TERM GOAL #2   Title AROM Lt shoudler equal or greater than Rt shoudler 02/11/16   Time 12   Period Weeks   Status On-going   PT LONG TERM GOAL #3   Title 4/5 to 5/5 Lt shoulder strength 02/11/16   Time 12   Period Weeks   Status On-going   PT LONG TERM GOAL #4   Title I in HEP 02/11/16   Time 12   Period Weeks   Status On-going   PT LONG TERM GOAL #5   Title Improve FOTO to </=37% limitation 02/11/16   Time 12   Period Weeks   Status On-going               Plan - 12/15/15 1104    Clinical Impression Statement Continued progress with shoulder ROM/strength/stabilization. Pt has accomplished 2 short term goals. Progressing well toward other stated goals of therapy.    Pt will benefit from skilled therapeutic intervention in order to improve on the following deficits Decreased range of motion;Decreased strength;Decreased activity tolerance;Decreased endurance;Postural dysfunction;Improper body mechanics   Rehab Potential Good   PT Frequency 2x / week   PT Duration 12 weeks   PT Treatment/Interventions  Patient/family education;Therapeutic activities;Therapeutic exercise;Manual techniques;Dry needling;Cryotherapy;Electrical Stimulation;Moist Heat;Ultrasound   PT Next Visit Plan DOS: 09/29/15 Patient 10 wks 3 day post Lt TSA- progress with ROM; stabilization and strengthening per protocol/as indicated    PT Home Exercise Plan postural correction; HEP    Consulted and Agree with Plan of Care Patient        Problem List Patient Active Problem List   Diagnosis Date Noted  . Osteoarthritis of left shoulder 09/29/2015  . Precordial pain   . Abnormal stress test   . Osteoarthritis of right shoulder, primary 12/16/2014  . S/P shoulder replacement 12/16/2014  . Preop cardiovascular exam 09/17/2014  . CAD (coronary artery disease) 01/29/2014  . HTN (hypertension) 01/29/2014  . Tremor 01/29/2014    Celyn Nilda Simmer PT, MPH  12/15/2015, 12:59 PM  [facility] [place], [place], [address] Phone: [phone]   Fax:  [phone]  Name: Jay Bradshaw MRN: [id number] Date of Birth: 18-Jun-1935

## 2015-12-17 ENCOUNTER — Encounter: Payer: Self-pay | Admitting: Rehabilitative and Restorative Service Providers"

## 2015-12-17 ENCOUNTER — Ambulatory Visit (INDEPENDENT_AMBULATORY_CARE_PROVIDER_SITE_OTHER): Payer: Medicare HMO | Admitting: Rehabilitative and Restorative Service Providers"

## 2015-12-17 DIAGNOSIS — R29898 Other symptoms and signs involving the musculoskeletal system: Secondary | ICD-10-CM

## 2015-12-17 DIAGNOSIS — R531 Weakness: Secondary | ICD-10-CM

## 2015-12-17 DIAGNOSIS — M25512 Pain in left shoulder: Secondary | ICD-10-CM | POA: Diagnosis not present

## 2015-12-17 DIAGNOSIS — M2560 Stiffness of unspecified joint, not elsewhere classified: Secondary | ICD-10-CM

## 2015-12-17 DIAGNOSIS — M623 Immobility syndrome (paraplegic): Secondary | ICD-10-CM | POA: Diagnosis not present

## 2015-12-17 DIAGNOSIS — Z7409 Other reduced mobility: Secondary | ICD-10-CM

## 2015-12-17 DIAGNOSIS — R6889 Other general symptoms and signs: Secondary | ICD-10-CM

## 2015-12-17 NOTE — Patient Instructions (Signed)
Scapular Retraction: Rowing (Eccentric) - Arms - 45 Degrees (Resistance Band)    Hold end of band in each hand. Pull back until elbows are even with trunk. Keep elbows out from sides at 45, thumbs up. Slowly release for 3-5 seconds.  Use yellow resistance band. __10_ reps per set, __2-3_ sets per day; 1 time/day

## 2015-12-17 NOTE — Therapy (Signed)
[place], [place], [address] Phone: [phone]   Fax:  [phone]  Physical Therapy Treatment  Patient Details  Name: Jay Bradshaw MRN: [id number] Date of Birth: 19-Feb-1935 Referring Provider: Carter Kitten  Encounter Date: 12/17/2015      PT End of Session - 12/17/15 0858    Visit Number 7   Number of Visits 24   Date for PT Re-Evaluation 02/11/16   PT Start Time 0848   PT Stop Time 0941   PT Time Calculation (min) 53 min   Activity Tolerance Patient tolerated treatment well      Past Medical History  Diagnosis Date  . Hyperlipidemia     takes Lovastatin daily  . Arthritis   . COPD (chronic obstructive pulmonary disease) ([place])   . Shortness of breath dyspnea   . Tremors of nervous system   . Osteoarthritis of right shoulder, primary 12/16/2014  . Complication of anesthesia   . PONV (postoperative nausea and vomiting)   . Hypothyroidism     takes Synthroid daily  . Constipation     takes Colace daily as needed  . Urinary frequency     takes Ditropan daily  . Enlarged prostate     takes Tamsulosin daily  . Coronary artery disease     takes Metoprolol daily  . Emphysema lung ([place])   . Productive cough   . Balance problem   . Joint pain   . Osteoarthritis of left shoulder 09/29/2015  . Seasonal allergies     Past Surgical History  Procedure Laterality Date  . Cataract extraction  Aug and Sep 2011    first left eye, then right eye  . Colonoscopy  June 2008    Dr. Lurena Joiner ref by Ruthann Cancer  . Irrigation and debridement sebaceous cyst  2010    from the back, [employer]  . Prostate biopsy  2006    Dr. Nadara Mustard [place]  . Lipoma excision  Nov 2004    from neck, right side (near location of mandible cyst), Dr Lorrine Kin ref by Rasalen  . Excision of fibroma  October 1994    in skin on back of neck right of midline, Dede Query ref by Lidia Collum  . Vacuolar cyst removal       in right mandible fixed/filled, Denyce Robert ref by Synetta Shadow, DDS  . Thyroid adenoma removal  August 1970    right side, O. Ptr.Schumacher/Hermann  . Hernia repair    . Shoulder surgery Right 12/16/2014    hemi-arthroplasty    dr Mardelle Matte  . Total shoulder arthroplasty Right 12/16/2014    Procedure: RIGHT TOTAL SHOULDER ARTHROPLASTY;  Surgeon: Johnny Bridge, MD;  Location: [place];  Service: Orthopedics;  Laterality: Right;  . Shoulder hemi-arthroplasty Right 12/16/2014    Procedure: SHOULDER HEMI-ARTHROPLASTY;  Surgeon: Johnny Bridge, MD;  Location: [place];  Service: Orthopedics;  Laterality: Right;  . Wisdom teeth extracted    . Cardiac catheterization N/A 09/25/2015    Procedure: Left Heart Cath and Coronary Angiography;  Surgeon: Burnell Blanks, MD;  Location: [place] [facility];  Service: Cardiovascular;  Laterality: N/A;  . Total shoulder arthroplasty Left 09/29/2015  . Total shoulder arthroplasty Left 09/29/2015    Procedure: TOTAL LEFT SHOULDER ARTHROPLASTY;  Surgeon: Marchia Bond, MD;  Location: [facility];  Service: Orthopedics;  Laterality: Left;    There were no vitals filed for this visit.  Visit Diagnosis:  Left shoulder pain  Weakness of left upper extremity  Stiffness due to immobility  Decreased strength, endurance, and mobility      Subjective Assessment - 12/17/15 0859    Subjective Lt UE "seems to be working better". Does not like some of the exercises, but he does them    Currently in Pain? Yes   Pain Score 2    Pain Location Shoulder   Pain Orientation Left   Pain Descriptors / Indicators Aching   Pain Type Chronic pain                         [facility] Adult PT Treatment/Exercise - 12/17/15 0001    Shoulder Exercises: Standing   External Rotation Both;Theraband;20 reps;Left   Theraband Level (Shoulder External Rotation) Level 1 (Yellow)   Extension Both;Theraband;20 reps   Theraband Level (Shoulder Extension) Level 3 (Green)   Row  Both;Theraband;20 reps   Theraband Level (Shoulder Row) Level 3 (Green)   Row Limitations row at 45 deg yellow TB 10 x 3    Other Standing Exercises scap squeeze with noodle 10 sec x 10    Other Standing Exercises standing scaption 1# Rt/1# Lt 10 x2    Shoulder Exercises: Pulleys   Flexion Limitations 10 sec hold x 10 reps    ABduction Limitations 10 sec x 5    Shoulder Exercises: Therapy Ball   Other Therapy Ball Exercises ball on wall CW/CCW 30-60 sec x4 reps    Shoulder Exercises: ROM/Strengthening   Wall Pushups 10 reps   Shoulder Exercises: Stretch   External Rotation Stretch --  with cane 10 sec x 10 standing    Wall Stretch - Flexion 4 reps;10 seconds  wall ladder - 29   Table Stretch - Flexion --  10 sec x 10    Moist Heat Therapy   Number Minutes Moist Heat 15 Minutes   Moist Heat Location Shoulder  LT   Manual Therapy   Soft tissue mobilization Lt teres/trap/pecs   Scapular Mobilization Lt    Passive ROM shd flex/abd/ER in neutral                 PT Education - 12/17/15 0920    Education provided Yes   Education Details HEP   Person(s) Educated Patient   Methods Explanation;Demonstration;Tactile cues;Verbal cues;Handout   Comprehension Verbalized understanding;Returned demonstration;Verbal cues required;Tactile cues required          PT Short Term Goals - 12/15/15 1106    PT SHORT TERM GOAL #1   Title Patient I In initial HEP 12/18/15   Time 4   Period Weeks   Status Achieved   PT SHORT TERM GOAL #2   Title Improve posture and alignment for exercises and functional activities with tactile and/or verbal cues of therapist 12/31/15   Time 6   Period Weeks   Status On-going   PT SHORT TERM GOAL #3   Title Patient reports using Lt UE for washing face; combing hair; assisting for kitchen tasks 12/31/15   Time 6   Period Weeks   Status Achieved   PT SHORT TERM GOAL #4   Title Increase active shoudler ROM by 10-20 degrees in all planes 12/31/15   Time 6    Period Weeks   Status On-going           PT Long Term Goals - 12/15/15 1107    PT LONG TERM GOAL #1   Title Patient reports using Lt UE for most  normal functional activities that do not require lifting >5 lbs. 02/11/16   Time 12   Period Weeks   Status On-going   PT LONG TERM GOAL #2   Title AROM Lt shoudler equal or greater than Rt shoudler 02/11/16   Time 12   Period Weeks   Status On-going   PT LONG TERM GOAL #3   Title 4/5 to 5/5 Lt shoulder strength 02/11/16   Time 12   Period Weeks   Status On-going   PT LONG TERM GOAL #4   Title I in HEP 02/11/16   Time 12   Period Weeks   Status On-going   PT LONG TERM GOAL #5   Title Improve FOTO to </=37% limitation 02/11/16   Time 12   Period Weeks   Status On-going               Plan - 12/17/15 0932    Clinical Impression Statement Continued progress with shoulder rehab - increasing ROM/stability/function.    Pt will benefit from skilled therapeutic intervention in order to improve on the following deficits Decreased range of motion;Decreased strength;Decreased activity tolerance;Decreased endurance;Postural dysfunction;Improper body mechanics   Rehab Potential Good   PT Frequency 2x / week   PT Duration 12 weeks   PT Treatment/Interventions Patient/family education;Therapeutic activities;Therapeutic exercise;Manual techniques;Dry needling;Cryotherapy;Electrical Stimulation;Moist Heat;Ultrasound   PT Next Visit Plan DOS: 09/29/15 Patient  wks  day post Lt TSA- progress with ROM; stabilization and strengthening per protocol/as indicated    PT Home Exercise Plan postural correction; HEP    Consulted and Agree with Plan of Care Patient        Problem List Patient Active Problem List   Diagnosis Date Noted  . Osteoarthritis of left shoulder 09/29/2015  . Precordial pain   . Abnormal stress test   . Osteoarthritis of right shoulder, primary 12/16/2014  . S/P shoulder replacement 12/16/2014  . Preop cardiovascular  exam 09/17/2014  . CAD (coronary artery disease) 01/29/2014  . HTN (hypertension) 01/29/2014  . Tremor 01/29/2014    Everardo All PT,MPH  12/17/2015, 9:34 AM  [facility] [place], [place], [address] Phone: [phone]   Fax:  [phone]  Name: Jay Bradshaw MRN: [id number] Date of Birth: 03-01-1935

## 2015-12-22 ENCOUNTER — Ambulatory Visit (INDEPENDENT_AMBULATORY_CARE_PROVIDER_SITE_OTHER): Payer: Medicare HMO | Admitting: Rehabilitative and Restorative Service Providers"

## 2015-12-22 ENCOUNTER — Encounter: Payer: Self-pay | Admitting: Rehabilitative and Restorative Service Providers"

## 2015-12-22 DIAGNOSIS — R531 Weakness: Secondary | ICD-10-CM

## 2015-12-22 DIAGNOSIS — M2560 Stiffness of unspecified joint, not elsewhere classified: Secondary | ICD-10-CM

## 2015-12-22 DIAGNOSIS — M25512 Pain in left shoulder: Secondary | ICD-10-CM | POA: Diagnosis not present

## 2015-12-22 DIAGNOSIS — M623 Immobility syndrome (paraplegic): Secondary | ICD-10-CM

## 2015-12-22 DIAGNOSIS — R29898 Other symptoms and signs involving the musculoskeletal system: Secondary | ICD-10-CM | POA: Diagnosis not present

## 2015-12-22 DIAGNOSIS — Z7409 Other reduced mobility: Secondary | ICD-10-CM

## 2015-12-22 NOTE — Patient Instructions (Signed)
Strengthening: Resisted Flexion    Hold tubing with left arm at side. Pull forward and up. Move shoulder through pain-free range of motion. Do not let shoulder lift up  Repeat _10___ times per set. Do __1-3__ sets per session. Do __1__ sessions per day.     PNF Strengthening: Resisted    Standing with resistive band around each hand, bring right arm up and away, thumb back. Repeat __10__ times per set. Do __1-3__ sets per session. Do __1__ sessions per day.

## 2015-12-22 NOTE — Therapy (Signed)
[facility] [place], [place], [address] Phone: [phone]   Fax:  [phone]  Physical Therapy Treatment  Patient Details  Name: Jay Bradshaw MRN: [id number] Date of Birth: 05-20-1935 Referring Provider: Carter Kitten  Encounter Date: 12/22/2015      PT End of Session - 12/22/15 1107    Visit Number 8   Number of Visits 24   Date for PT Re-Evaluation 02/11/16   PT Start Time 1103   PT Stop Time 1154   PT Time Calculation (min) 51 min   Activity Tolerance Patient tolerated treatment well      Past Medical History  Diagnosis Date  . Hyperlipidemia     takes Lovastatin daily  . Arthritis   . COPD (chronic obstructive pulmonary disease) ([place])   . Shortness of breath dyspnea   . Tremors of nervous system   . Osteoarthritis of right shoulder, primary 12/16/2014  . Complication of anesthesia   . PONV (postoperative nausea and vomiting)   . Hypothyroidism     takes Synthroid daily  . Constipation     takes Colace daily as needed  . Urinary frequency     takes Ditropan daily  . Enlarged prostate     takes Tamsulosin daily  . Coronary artery disease     takes Metoprolol daily  . Emphysema lung ([place])   . Productive cough   . Balance problem   . Joint pain   . Osteoarthritis of left shoulder 09/29/2015  . Seasonal allergies     Past Surgical History  Procedure Laterality Date  . Cataract extraction  Aug and Sep 2011    first left eye, then right eye  . Colonoscopy  June 2008    Dr. Lurena Joiner ref by Ruthann Cancer  . Irrigation and debridement sebaceous cyst  2010    from the back, [employer]  . Prostate biopsy  2006    Dr. Nadara Mustard [facility]  . Lipoma excision  Nov 2004    from neck, right side (near location of mandible cyst), Dr Lorrine Kin ref by Rasalen  . Excision of fibroma  October 1994    in skin on back of neck right of midline, Dede Query ref by Lidia Collum  . Vacuolar cyst removal       in right mandible fixed/filled, Denyce Robert ref by Synetta Shadow, DDS  . Thyroid adenoma removal  August 1970    right side, O. Ptr.Schumacher/Hermann  . Hernia repair    . Shoulder surgery Right 12/16/2014    hemi-arthroplasty    dr Mardelle Matte  . Total shoulder arthroplasty Right 12/16/2014    Procedure: RIGHT TOTAL SHOULDER ARTHROPLASTY;  Surgeon: Johnny Bridge, MD;  Location: [facility];  Service: Orthopedics;  Laterality: Right;  . Shoulder hemi-arthroplasty Right 12/16/2014    Procedure: SHOULDER HEMI-ARTHROPLASTY;  Surgeon: Johnny Bridge, MD;  Location: [facility];  Service: Orthopedics;  Laterality: Right;  . Wisdom teeth extracted    . Cardiac catheterization N/A 09/25/2015    Procedure: Left Heart Cath and Coronary Angiography;  Surgeon: Burnell Blanks, MD;  Location: [facility];  Service: Cardiovascular;  Laterality: N/A;  . Total shoulder arthroplasty Left 09/29/2015  . Total shoulder arthroplasty Left 09/29/2015    Procedure: TOTAL LEFT SHOULDER ARTHROPLASTY;  Surgeon: Marchia [facility], MD;  Location: [facility];  Service: Orthopedics;  Laterality: Left;    There were no vitals filed for this visit.  Visit Diagnosis:  Left shoulder pain  Weakness of left upper extremity  Stiffness due to immobility  Decreased strength, endurance, and mobility      Subjective Assessment - 12/22/15 1108    Subjective Shoulder feels "pretty good".He is using Lt UE for more functional activites. he still can't tuck shirt in with Lt UE and has sensation that his Lt arm is not as strong as he would like for it to be. Will be going out of town for 1 month next week. Will need to have all his exercises for HEP when he is away    Currently in Pain? Yes   Pain Score 1    Pain Location Shoulder   Pain Orientation Left   Pain Descriptors / Indicators Sore   Pain Type Chronic pain   Pain Onset More than a month ago   Pain Frequency Intermittent            [facility] PT Assessment - 12/22/15 0001     Assessment   Medical Diagnosis Lt TSA   Referring Provider Carter Kitten   Onset Date/Surgical Date 09/29/15   Hand Dominance Right   Prior Therapy for Rt shoulder    PROM   Left Shoulder Flexion 135 Degrees   Left Shoulder ABduction 124 Degrees   Palpation   Palpation comment tenderness/tightness through pecs/upper trap/deltoid/teres                     [facility] Adult PT Treatment/Exercise - 12/22/15 0001    Shoulder Exercises: Seated   Other Seated Exercises initiation of deltoid 2 sec x 10 elbows bent 3 # wt bilat    Shoulder Exercises: Standing   External Rotation Both;Theraband;20 reps;Left   Theraband Level (Shoulder External Rotation) Level 1 (Yellow)   Flexion Strengthening;Left;20 reps;Theraband   Theraband Level (Shoulder Flexion) Level 1 (Yellow)   Extension Both;Theraband;20 reps   Theraband Level (Shoulder Extension) Level 4 (Blue)   Row Both;Theraband;20 reps   Theraband Level (Shoulder Row) Level 4 (Blue)   Row Limitations row at 45 deg yellow TB 10 x 3    Other Standing Exercises scap squeeze with noodle 10 sec x 10    Other Standing Exercises standing scaption 3# bilat 10 x2    Shoulder Exercises: Pulleys   Flexion Limitations 10 sec hold x 10 reps    ABduction Limitations 10 sec x 5    Shoulder Exercises: Therapy Ball   Other Therapy Ball Exercises ball on wall CW/CCW 30-60 sec x4 reps    Shoulder Exercises: ROM/Strengthening   Wall Pushups 10 reps   Other ROM/Strengthening Exercises bent fwd resting on Rt arm extended on chari for Lt UE shd ext 3# wt x 10; 2 sets    Other ROM/Strengthening Exercises diagonal adduction yellow TB x 10   Shoulder Exercises: Stretch   Wall Stretch - Flexion 4 reps;10 seconds  wall ladder - 29   Moist Heat Therapy   Number Minutes Moist Heat 15 Minutes   Moist Heat Location Shoulder  LT   Manual Therapy   Soft tissue mobilization Lt teres/trap/pecs   Scapular Mobilization Lt    Passive ROM shd flex/abd/ER in  neutral                 PT Education - 12/22/15 1139    Education provided Yes   Education Details HEP   Person(s) Educated Patient   Methods Explanation;Demonstration;Tactile cues;Verbal cues;Handout   Comprehension Verbalized understanding;Returned demonstration;Verbal cues required;Tactile cues required  PT Short Term Goals - 12/15/15 1106    PT SHORT TERM GOAL #1   Title Patient I In initial HEP 12/18/15   Time 4   Period Weeks   Status Achieved   PT SHORT TERM GOAL #2   Title Improve posture and alignment for exercises and functional activities with tactile and/or verbal cues of therapist 12/31/15   Time 6   Period Weeks   Status On-going   PT SHORT TERM GOAL #3   Title Patient reports using Lt UE for washing face; combing hair; assisting for kitchen tasks 12/31/15   Time 6   Period Weeks   Status Achieved   PT SHORT TERM GOAL #4   Title Increase active shoudler ROM by 10-20 degrees in all planes 12/31/15   Time 6   Period Weeks   Status On-going           PT Long Term Goals - 12/15/15 1107    PT LONG TERM GOAL #1   Title Patient reports using Lt UE for most normal functional activities that do not require lifting >5 lbs. 02/11/16   Time 12   Period Weeks   Status On-going   PT LONG TERM GOAL #2   Title AROM Lt shoudler equal or greater than Rt shoudler 02/11/16   Time 12   Period Weeks   Status On-going   PT LONG TERM GOAL #3   Title 4/5 to 5/5 Lt shoulder strength 02/11/16   Time 12   Period Weeks   Status On-going   PT LONG TERM GOAL #4   Title I in HEP 02/11/16   Time 12   Period Weeks   Status On-going   PT LONG TERM GOAL #5   Title Improve FOTO to </=37% limitation 02/11/16   Time 12   Period Weeks   Status On-going               Problem List Patient Active Problem List   Diagnosis Date Noted  . Osteoarthritis of left shoulder 09/29/2015  . Precordial pain   . Abnormal stress test   . Osteoarthritis of right  shoulder, primary 12/16/2014  . S/P shoulder replacement 12/16/2014  . Preop cardiovascular exam 09/17/2014  . CAD (coronary artery disease) 01/29/2014  . HTN (hypertension) 01/29/2014  . Tremor 01/29/2014    Kishawn Pickar Nilda Simmer PT, MPH  12/22/2015, 1:13 PM  [facility] [place], [place], [address] Phone: [phone]   Fax:  [phone]  Name: Muneeb Misfeldt MRN: [id number] Date of Birth: March 20, 1935

## 2015-12-25 ENCOUNTER — Ambulatory Visit (INDEPENDENT_AMBULATORY_CARE_PROVIDER_SITE_OTHER): Payer: Medicare HMO | Admitting: Rehabilitative and Restorative Service Providers"

## 2015-12-25 ENCOUNTER — Encounter: Payer: Self-pay | Admitting: Rehabilitative and Restorative Service Providers"

## 2015-12-25 DIAGNOSIS — R29898 Other symptoms and signs involving the musculoskeletal system: Secondary | ICD-10-CM

## 2015-12-25 DIAGNOSIS — M25512 Pain in left shoulder: Secondary | ICD-10-CM

## 2015-12-25 DIAGNOSIS — Z7409 Other reduced mobility: Secondary | ICD-10-CM | POA: Diagnosis not present

## 2015-12-25 DIAGNOSIS — M623 Immobility syndrome (paraplegic): Secondary | ICD-10-CM | POA: Diagnosis not present

## 2015-12-25 DIAGNOSIS — R531 Weakness: Secondary | ICD-10-CM

## 2015-12-25 DIAGNOSIS — M2560 Stiffness of unspecified joint, not elsewhere classified: Secondary | ICD-10-CM

## 2015-12-25 NOTE — Patient Instructions (Addendum)
External Rotator Cuff Stretch, Standing    Stand holding club or towel behind body, right arm above head, left arm bent behind back. Pull gently upward. Hold _10-20__ seconds. Repeat __5-10_ times per session. Do __1-2_ sessions per day. Repeat with right arm     Strengthening: Resisted External Rotation    Hold tubing in right hand, elbow at side and forearm across body. Rotate forearm out. Keep forearm level with floor - do not lift arm upward like this incorrect picture shows! Repeat _10___ times per set. Do __1-3__ sets per session. Do __1__ sessions per day. Red or green theraband     Strengthening: Resisted Internal Rotation    Hold tubing in left hand, elbow at side and forearm out. Rotate forearm in across body. Stop at suspender level on the side you are working. Don't pull the band across to your belly button like this incorrect picture shows. Repeat __10__ times per set. Do __1-3__ sets per session. Do _1___ sessions per day. Red theraband progressing to green    Scapula Adduction With Pectoralis Stretch: Low - Standing   Shoulders at 45 hands even with shoulders, keeping weight through legs, shift weight forward until you feel pull or stretch through the front of your chest. Hold _30__ seconds. Do _3__ times, _2-4__ times per day.   Scapula Adduction With Pectoralis Stretch: Mid-Range - Standing   Shoulders at 90 elbows even with shoulders, keeping weight through legs, shift weight forward until you feel pull or strength through the front of your chest. Hold __30_ seconds. Do _3__ times, __2-4_ times per day.   Scapula Adduction With Pectoralis Stretch: High - Standing   Shoulders at 120 hands up high on the doorway, keeping weight on feet, shift weight forward until you feel pull or stretch through the front of your chest. Hold _30__ seconds. Do _3__ times, _2-3__ times per day.   Can do doorway stretches with one arm along the doorframe

## 2015-12-25 NOTE — Therapy (Signed)
[facility] [address] [place], [place], [address] Phone: [phone]   Fax:  [phone]  Physical Therapy Treatment  Patient Details  Name: Jay Bradshaw MRN: [id number] Date of Birth: November 09, 1935 Referring Provider: Carter Kitten  Encounter Date: 12/25/2015      PT End of Session - 12/25/15 1104    Visit Number 9   Number of Visits 24   Date for PT Re-Evaluation 02/11/16   PT Start Time 1103   PT Stop Time 1155   PT Time Calculation (min) 52 min   Activity Tolerance Patient tolerated treatment well      Past Medical History  Diagnosis Date  . Hyperlipidemia     takes Lovastatin daily  . Arthritis   . COPD (chronic obstructive pulmonary disease) ([place])   . Shortness of breath dyspnea   . Tremors of nervous system   . Osteoarthritis of right shoulder, primary 12/16/2014  . Complication of anesthesia   . PONV (postoperative nausea and vomiting)   . Hypothyroidism     takes Synthroid daily  . Constipation     takes Colace daily as needed  . Urinary frequency     takes Ditropan daily  . Enlarged prostate     takes Tamsulosin daily  . Coronary artery disease     takes Metoprolol daily  . Emphysema lung ([place])   . Productive cough   . Balance problem   . Joint pain   . Osteoarthritis of left shoulder 09/29/2015  . Seasonal allergies     Past Surgical History  Procedure Laterality Date  . Cataract extraction  Aug and Sep 2011    first left eye, then right eye  . Colonoscopy  June 2008    Dr. Lurena Joiner ref by Ruthann Cancer  . Irrigation and debridement sebaceous cyst  2010    from the back, [employer]  . Prostate biopsy  2006    Dr. Nadara Mustard [facility]  . Lipoma excision  Nov 2004    from neck, right side (near location of mandible cyst), Dr Lorrine Kin ref by Rasalen  . Excision of fibroma  October 1994    in skin on back of neck right of midline, Dede Query ref by Lidia Collum  . Vacuolar cyst removal     in right mandible fixed/filled, Denyce Robert ref by Synetta Shadow, DDS  . Thyroid adenoma removal  August 1970    right side, O. Ptr.Schumacher/Hermann  . Hernia repair    . Shoulder surgery Right 12/16/2014    hemi-arthroplasty    dr Mardelle Matte  . Total shoulder arthroplasty Right 12/16/2014    Procedure: RIGHT TOTAL SHOULDER ARTHROPLASTY;  Surgeon: Johnny Bridge, MD;  Location: [facility];  Service: Orthopedics;  Laterality: Right;  . Shoulder hemi-arthroplasty Right 12/16/2014    Procedure: SHOULDER HEMI-ARTHROPLASTY;  Surgeon: Johnny Bridge, MD;  Location: [facility];  Service: Orthopedics;  Laterality: Right;  . Wisdom teeth extracted    . Cardiac catheterization N/A 09/25/2015    Procedure: Left Heart Cath and Coronary Angiography;  Surgeon: Burnell Blanks, MD;  Location: [facility];  Service: Cardiovascular;  Laterality: N/A;  . Total shoulder arthroplasty Left 09/29/2015  . Total shoulder arthroplasty Left 09/29/2015    Procedure: TOTAL LEFT SHOULDER ARTHROPLASTY;  Surgeon: Marchia [facility], MD;  Location: [facility];  Service: Orthopedics;  Laterality: Left;    There were no vitals filed for this visit.  Visit Diagnosis:  Left shoulder pain  Weakness of  left upper extremity  Stiffness due to immobility  Decreased strength, endurance, and mobility      Subjective Assessment - 12/25/15 1104    Subjective Feeling more I in HEP and is using the Lt shoulder more for functional activities. Jay Bradshaw will be out of town beginning the end of next week - for about a month.    Currently in Pain? Yes   Pain Score 1    Pain Location Shoulder   Pain Orientation Left   Pain Descriptors / Indicators Sore;Dull  nothing sharp   Pain Onset More than a month ago   Pain Frequency Intermittent   Aggravating Factors  exercises; moving shouder; using Lt UE    Pain Relieving Factors rest; sleep                         [facility] Adult PT Treatment/Exercise - 12/25/15 0001    Shoulder  Exercises: Standing   External Rotation Both;Theraband;20 reps;Left   Theraband Level (Shoulder External Rotation) Level 2 (Red)   Internal Rotation Left;20 reps;Theraband   Theraband Level (Shoulder Internal Rotation) Level 2 (Red)   Flexion Left;20 reps;Theraband   Theraband Level (Shoulder Flexion) Level 1 (Yellow)   Extension Both;Theraband;20 reps   Theraband Level (Shoulder Extension) Level 4 (Blue)   Row Both;Theraband;20 reps   Theraband Level (Shoulder Row) Level 4 (Blue)   Row Limitations row at 45 deg yellow TB 10 x 3    Other Standing Exercises scap squeeze with noodle 10 sec x 10    Other Standing Exercises standing scaption 3# bilat 10 x2    Shoulder Exercises: Pulleys   Flexion Limitations 10 sec hold x 10 reps    ABduction Limitations 10 sec x 5    Other Pulley Exercises IR 10 sec hold x 5   Shoulder Exercises: ROM/Strengthening   Wall Pushups 10 reps   Shoulder Exercises: Stretch   Corner Stretch Limitations 3 way doorway stretching 20 sec x3  to pt tolerance    Moist Heat Therapy   Number Minutes Moist Heat 15 Minutes   Moist Heat Location Shoulder  LT   Manual Therapy   Soft tissue mobilization Lt teres/trap/pecs   Scapular Mobilization Lt    Passive ROM shd flex/abd/ER in neutral                 PT Education - 12/25/15 1127    Education provided Yes   Education Details HEP    Person(s) Educated Patient   Methods Explanation;Demonstration;Tactile cues;Verbal cues;Handout   Comprehension Verbalized understanding;Returned demonstration;Verbal cues required;Tactile cues required          PT Short Term Goals - 12/25/15 1353    PT SHORT TERM GOAL #1   Title Patient I In initial HEP 12/18/15   Time 4   Period Weeks   Status Achieved   PT SHORT TERM GOAL #2   Title Improve posture and alignment for exercises and functional activities with tactile and/or verbal cues of therapist 12/31/15   Time 6   Period Weeks   Status Achieved   PT SHORT TERM  GOAL #3   Title Patient reports using Lt UE for washing face; combing hair; assisting for kitchen tasks 12/31/15   Time 6   Period Weeks   Status Achieved   PT SHORT TERM GOAL #4   Title Increase active shoudler ROM by 10-20 degrees in all planes 12/31/15   Time 6   Period Weeks  Status On-going           PT Long Term Goals - 12/15/15 1107    PT LONG TERM GOAL #1   Title Patient reports using Lt UE for most normal functional activities that do not require lifting >5 lbs. 02/11/16   Time 12   Period Weeks   Status On-going   PT LONG TERM GOAL #2   Title AROM Lt shoudler equal or greater than Rt shoudler 02/11/16   Time 12   Period Weeks   Status On-going   PT LONG TERM GOAL #3   Title 4/5 to 5/5 Lt shoulder strength 02/11/16   Time 12   Period Weeks   Status On-going   PT LONG TERM GOAL #4   Title I in HEP 02/11/16   Time 12   Period Weeks   Status On-going   PT LONG TERM GOAL #5   Title Improve FOTO to </=37% limitation 02/11/16   Time 12   Period Weeks   Status On-going               Plan - 12/25/15 1155    Clinical Impression Statement Progressing well. Tolerated the doorway stretch and new exercises well. Less palpable tightness noted through the [employer] and pecs. Progressing well with shoulder rehab.    Pt will benefit from skilled therapeutic intervention in order to improve on the following deficits Decreased range of motion;Decreased strength;Decreased activity tolerance;Decreased endurance;Postural dysfunction;Improper body mechanics   Rehab Potential Good   PT Frequency 2x / week   PT Duration 12 weeks   PT Treatment/Interventions Patient/family education;Therapeutic activities;Therapeutic exercise;Manual techniques;Dry needling;Cryotherapy;Electrical Stimulation;Moist Heat;Ultrasound   PT Next Visit Plan DOS: 09/29/15 Patient  wks  day post Lt TSA- progress with ROM; stabilization and strengthening per protocol/as indicated    PT Home Exercise Plan  postural correction; HEP    Consulted and Agree with Plan of Care Patient        Problem List Patient Active Problem List   Diagnosis Date Noted  . Osteoarthritis of left shoulder 09/29/2015  . Precordial pain   . Abnormal stress test   . Osteoarthritis of right shoulder, primary 12/16/2014  . S/P shoulder replacement 12/16/2014  . Preop cardiovascular exam 09/17/2014  . CAD (coronary artery disease) 01/29/2014  . HTN (hypertension) 01/29/2014  . Tremor 01/29/2014    Jay Bradshaw Nilda Simmer PT, MPH  12/25/2015, 1:54 PM  [facility] [place], [place], [address] Phone: [phone]   Fax:  [phone]  Name: Jay Bradshaw MRN: [id number] Date of Birth: [birth year]

## 2015-12-29 ENCOUNTER — Ambulatory Visit (INDEPENDENT_AMBULATORY_CARE_PROVIDER_SITE_OTHER): Payer: Medicare HMO | Admitting: Rehabilitative and Restorative Service Providers"

## 2015-12-29 ENCOUNTER — Encounter: Payer: Self-pay | Admitting: Rehabilitative and Restorative Service Providers"

## 2015-12-29 DIAGNOSIS — Z7409 Other reduced mobility: Secondary | ICD-10-CM

## 2015-12-29 DIAGNOSIS — M25512 Pain in left shoulder: Secondary | ICD-10-CM | POA: Diagnosis not present

## 2015-12-29 DIAGNOSIS — M623 Immobility syndrome (paraplegic): Secondary | ICD-10-CM

## 2015-12-29 DIAGNOSIS — R531 Weakness: Secondary | ICD-10-CM

## 2015-12-29 DIAGNOSIS — R29898 Other symptoms and signs involving the musculoskeletal system: Secondary | ICD-10-CM | POA: Diagnosis not present

## 2015-12-29 DIAGNOSIS — M2560 Stiffness of unspecified joint, not elsewhere classified: Secondary | ICD-10-CM

## 2015-12-29 NOTE — Patient Instructions (Signed)
Chest / Bicep Stretch    Lace fingers behind back and squeeze shoulder blades together. Slowly raise and straighten arms. Hold __10-15__ seconds. Repeat _3-5___ times per set.  Do _2-3 ___ sessions per day.   ROM: Towel Stretch - with Interior Rotation    Pull left arm up behind back by pulling towel up with other arm. Hold __30__ seconds. Repeat _3___ times per set.  Do _2-3___ sessions per day.   Horizontal cane lift behind back Both hands on the cane. Hands close together  Slide cane up back  Hold 10-20 sec  Repeat 5 times

## 2015-12-29 NOTE — Therapy (Signed)
[place], [place], [address] Phone: [phone]   Fax:  [phone]  Physical Therapy Treatment  Patient Details  Name: Jay Bradshaw MRN: [id number] Date of Birth: 02/08/1935 Referring Provider: Carter Bradshaw  Encounter Date: 12/29/2015      PT End of Session - 12/29/15 1311    Visit Number 10   Number of Visits 24   Date for PT Re-Evaluation 02/11/16   PT Start Time 1103   PT Stop Time 1159   PT Time Calculation (min) 56 min   Activity Tolerance Patient tolerated treatment well      Past Medical History  Diagnosis Date  . Hyperlipidemia     takes Lovastatin daily  . Arthritis   . COPD (chronic obstructive pulmonary disease) ([place])   . Shortness of breath dyspnea   . Tremors of nervous system   . Osteoarthritis of right shoulder, primary 12/16/2014  . Complication of anesthesia   . PONV (postoperative nausea and vomiting)   . Hypothyroidism     takes Synthroid daily  . Constipation     takes Colace daily as needed  . Urinary frequency     takes Ditropan daily  . Enlarged prostate     takes Tamsulosin daily  . Coronary artery disease     takes Metoprolol daily  . Emphysema lung ([place])   . Productive cough   . Balance problem   . Joint pain   . Osteoarthritis of left shoulder 09/29/2015  . Seasonal allergies     Past Surgical History  Procedure Laterality Date  . Cataract extraction  Aug and Sep 2011    first left eye, then right eye  . Colonoscopy  June 2008    Dr. Lurena Bradshaw ref by Jay Bradshaw  . Irrigation and debridement sebaceous cyst  2010    from the back, [employer]  . Prostate biopsy  2006    Dr. Nadara Mustard Bradshaw  . Lipoma excision  Nov 2004    from neck, right side (near location of mandible cyst), Dr Jay Bradshaw ref by Jay Bradshaw  . Excision of fibroma  October 1994    in skin on back of neck right of midline, Jay Bradshaw ref by Jay Bradshaw  . Vacuolar cyst removal     in right mandible fixed/filled, Jay Bradshaw ref by Jay Bradshaw, DDS  . Thyroid adenoma removal  August 1970    right side, Jay Bradshaw  . Hernia repair    . Shoulder surgery Right 12/16/2014    hemi-arthroplasty    dr Jay Bradshaw  . Total shoulder arthroplasty Right 12/16/2014    Procedure: RIGHT TOTAL SHOULDER ARTHROPLASTY;  Surgeon: Jay Bridge, MD;  Location: [facility];  Service: Orthopedics;  Laterality: Right;  . Shoulder hemi-arthroplasty Right 12/16/2014    Procedure: SHOULDER HEMI-ARTHROPLASTY;  Surgeon: Jay Bridge, MD;  Location: [facility];  Service: Orthopedics;  Laterality: Right;  . Wisdom teeth extracted    . Cardiac catheterization N/A 09/25/2015    Procedure: Left Heart Cath and Coronary Angiography;  Surgeon: Jay Blanks, MD;  Location: [facility];  Service: Cardiovascular;  Laterality: N/A;  . Total shoulder arthroplasty Left 09/29/2015  . Total shoulder arthroplasty Left 09/29/2015    Procedure: TOTAL LEFT SHOULDER ARTHROPLASTY;  Surgeon: Jay Bond, MD;  Location: [facility];  Service: Orthopedics;  Laterality: Left;    There were no vitals filed for this visit.  Visit Diagnosis:  Left shoulder pain  Weakness of  left upper extremity  Stiffness due to immobility  Decreased strength, endurance, and mobility      Subjective Assessment - 12/29/15 1124    Subjective Feeling more I in HEP and is using the Lt shoulder more for functional activities. Jay Bradshaw will be out of town beginning the end of next week - for an undetermined amout of time maybe up to 2 weeks. He is progressing well toward stated goals..    Currently in Pain? Yes   Pain Score 1    Pain Location Shoulder   Pain Orientation Left   Pain Descriptors / Indicators Sore;Dull   Pain Type Chronic pain   Pain Onset More than a month ago   Pain Frequency Intermittent            [facility] PT Assessment - 12/29/15 0001    Assessment   Medical Diagnosis Lt TSA   Referring Provider  Jay Bradshaw   Onset Date/Surgical Date 09/29/15   Hand Dominance Right   Prior Therapy for Rt shoulder    AROM   Left Shoulder Extension 50 Degrees   Left Shoulder Flexion 113 Degrees   Left Shoulder ABduction 98 Degrees   Left Shoulder Internal Rotation 29 Degrees   Left Shoulder External Rotation 66 Degrees   PROM   Left Shoulder Flexion 135 Degrees   Left Shoulder ABduction 128 Degrees   Left Shoulder Internal Rotation 50 Degrees   Left Shoulder External Rotation 52 Degrees   Palpation   Palpation comment tenderness/tightness through pecs/upper trap/deltoid/teres                     [facility] Adult PT Treatment/Exercise - 12/29/15 0001    Shoulder Exercises: Standing   External Rotation Both;Theraband;20 reps;Left   Theraband Level (Shoulder External Rotation) Level 2 (Red)   Internal Rotation Left;20 reps;Theraband   Theraband Level (Shoulder Internal Rotation) Level 2 (Red)   Flexion Left;20 reps;Theraband   Theraband Level (Shoulder Flexion) Level 1 (Yellow)   Extension Both;Theraband;20 reps   Theraband Level (Shoulder Extension) Level 4 (Blue)   Row Both;Theraband;20 reps   Theraband Level (Shoulder Row) Level 4 (Blue)   Row Limitations row at 45 deg yellow TB 10 x 3    Other Standing Exercises scap squeeze with noodle 10 sec x 10    Other Standing Exercises standing scaption 3# bilat 10 x2    Shoulder Exercises: ROM/Strengthening   Wall Pushups 10 reps   Shoulder Exercises: Stretch   Corner Stretch Limitations 3 way doorway stretching 20 sec x3  to pt tolerance    Wall Stretch - Flexion 4 reps;10 seconds  ladder 30   Other Shoulder Stretches IR stretch with strap 30 sec x 2 each UE    Other Shoulder Stretches shd extension with cane 10-20 sec x 5    Moist Heat Therapy   Number Minutes Moist Heat 15 Minutes   Moist Heat Location Shoulder  LT   Manual Therapy   Soft tissue mobilization Lt teres/trap/pecs   Scapular Mobilization Lt    Passive ROM shd  flex/abd/ER/IR/ext in neutral                 PT Education - 12/29/15 1147    Education provided Yes   Education Details HEP   Person(s) Educated Patient   Methods Explanation;Demonstration;Tactile cues;Verbal cues;Handout   Comprehension Verbalized understanding;Returned demonstration;Verbal cues required;Tactile cues required          PT Short Term Goals - 12/29/15 1316  PT SHORT TERM GOAL #4   Title Increase active shoudler ROM by 10-20 degrees in all planes 12/31/15   Time 6   Period Weeks   Status Achieved           PT Long Term Goals - 12/29/15 1316    PT LONG TERM GOAL #1   Title Patient reports using Lt UE for most normal functional activities that do not require lifting >5 lbs. 02/11/16   Time 12   Period Weeks   Status On-going   PT LONG TERM GOAL #2   Title AROM Lt shoudler equal or greater than Rt shoudler 02/11/16   Time 12   Period Weeks   Status On-going   PT LONG TERM GOAL #3   Title 4/5 to 5/5 Lt shoulder strength 02/11/16   Time 12   Period Weeks   Status On-going   PT LONG TERM GOAL #4   Title I in HEP 02/11/16   Time 12   Period Weeks   Status On-going   PT LONG TERM GOAL #5   Title Improve FOTO to </=37% limitation 02/11/16   Time 12   Period Weeks   Status On-going               Plan - 12/29/15 1311    Clinical Impression Statement Jay Bradshaw continues to progress with shoulder rehab demonstrating increased AROM/PROM/functional abilities. He is working well toward stated goals of therapy but will benefit from continued treatment to reach maximum rehab potential.    Pt will benefit from skilled therapeutic intervention in order to improve on the following deficits Decreased range of motion;Decreased strength;Decreased activity tolerance;Decreased endurance;Postural dysfunction;Improper body mechanics   Rehab Potential Good   PT Frequency 2x / week   PT Duration 12 weeks   PT Treatment/Interventions Patient/family  education;Therapeutic activities;Therapeutic exercise;Manual techniques;Dry needling;Cryotherapy;Electrical Stimulation;Moist Heat;Ultrasound   PT Next Visit Plan DOS: 09/29/15 Patient  14 wks  post Lt TSA- progress with ROM; stabilization and strengthening per protocol/as indicated    PT Home Exercise Plan postural correction; HEP    Consulted and Agree with Plan of Care Patient        Problem List Patient Active Problem List   Diagnosis Date Noted  . Osteoarthritis of left shoulder 09/29/2015  . Precordial pain   . Abnormal stress test   . Osteoarthritis of right shoulder, primary 12/16/2014  . S/P shoulder replacement 12/16/2014  . Preop cardiovascular exam 09/17/2014  . CAD (coronary artery disease) 01/29/2014  . HTN (hypertension) 01/29/2014  . Tremor 01/29/2014    Jay Bradshaw Nilda Simmer PT, MPH  12/29/2015, 1:19 PM  [facility] [place], [place], [address] Phone: [phone]   Fax:  [phone]  Name: Jay Bradshaw MRN: [id number] Date of Birth: 20-Oct-1935

## 2016-01-12 ENCOUNTER — Encounter: Payer: Medicare HMO | Admitting: Rehabilitative and Restorative Service Providers"

## 2016-01-26 ENCOUNTER — Encounter: Payer: Medicare HMO | Admitting: Rehabilitative and Restorative Service Providers"

## 2016-02-09 ENCOUNTER — Ambulatory Visit (INDEPENDENT_AMBULATORY_CARE_PROVIDER_SITE_OTHER): Payer: Medicare HMO | Admitting: Rehabilitative and Restorative Service Providers"

## 2016-02-09 ENCOUNTER — Encounter: Payer: Self-pay | Admitting: Rehabilitative and Restorative Service Providers"

## 2016-02-09 DIAGNOSIS — M623 Immobility syndrome (paraplegic): Secondary | ICD-10-CM | POA: Diagnosis not present

## 2016-02-09 DIAGNOSIS — M25512 Pain in left shoulder: Secondary | ICD-10-CM

## 2016-02-09 DIAGNOSIS — R6889 Other general symptoms and signs: Secondary | ICD-10-CM

## 2016-02-09 DIAGNOSIS — Z7409 Other reduced mobility: Secondary | ICD-10-CM | POA: Diagnosis not present

## 2016-02-09 DIAGNOSIS — R29898 Other symptoms and signs involving the musculoskeletal system: Secondary | ICD-10-CM

## 2016-02-09 DIAGNOSIS — M2560 Stiffness of unspecified joint, not elsewhere classified: Secondary | ICD-10-CM

## 2016-02-09 DIAGNOSIS — R531 Weakness: Secondary | ICD-10-CM

## 2016-02-09 NOTE — Therapy (Signed)
[place], [place], [address] Phone: [phone]   Fax:  [phone]  Physical Therapy Treatment  Patient Details  Name: Jay Bradshaw MRN: [id number] Date of Birth: [birth year] Referring Provider: Carter Kitten  Encounter Date: 02/09/2016      PT End of Session - 02/09/16 1104    Visit Number 11   Number of Visits 24   Date for PT Re-Evaluation 02/11/16   PT Start Time 1102   PT Stop Time 1134   PT Time Calculation (min) 32 min   Activity Tolerance Patient tolerated treatment well      Past Medical History  Diagnosis Date  . Hyperlipidemia     takes Lovastatin daily  . Arthritis   . COPD (chronic obstructive pulmonary disease) ([place])   . Shortness of breath dyspnea   . Tremors of nervous system   . Osteoarthritis of right shoulder, primary 12/16/2014  . Complication of anesthesia   . PONV (postoperative nausea and vomiting)   . Hypothyroidism     takes Synthroid daily  . Constipation     takes Colace daily as needed  . Urinary frequency     takes Ditropan daily  . Enlarged prostate     takes Tamsulosin daily  . Coronary artery disease     takes Metoprolol daily  . Emphysema lung ([place])   . Productive cough   . Balance problem   . Joint pain   . Osteoarthritis of left shoulder 09/29/2015  . Seasonal allergies     Past Surgical History  Procedure Laterality Date  . Cataract extraction  Aug and Sep 2011    first left eye, then right eye  . Colonoscopy  June 2008    Dr. Lurena Joiner ref by Ruthann Cancer  . Irrigation and debridement sebaceous cyst  2010    from the back, [employer]  . Prostate biopsy  2006    Dr. Nadara Mustard [place]  . Lipoma excision  Nov 2004    from neck, right side (near location of mandible cyst), Dr Lorrine Kin ref by [place]  . Excision of fibroma  October 1994    in skin on back of neck right of midline, Dede Query ref by Lidia Collum  . Vacuolar cyst removal     in right mandible fixed/filled, Denyce Robert ref by Synetta Shadow, DDS  . Thyroid adenoma removal  August 1970    right side, [facility]  . Hernia repair    . Shoulder surgery Right 12/16/2014    hemi-arthroplasty    dr Mardelle Matte  . Total shoulder arthroplasty Right 12/16/2014    Procedure: RIGHT TOTAL SHOULDER ARTHROPLASTY;  Surgeon: Johnny Bridge, MD;  Location: [facility];  Service: Orthopedics;  Laterality: Right;  . Shoulder hemi-arthroplasty Right 12/16/2014    Procedure: SHOULDER HEMI-ARTHROPLASTY;  Surgeon: Johnny Bridge, MD;  Location: [facility];  Service: Orthopedics;  Laterality: Right;  . Wisdom teeth extracted    . Cardiac catheterization N/A 09/25/2015    Procedure: Left Heart Cath and Coronary Angiography;  Surgeon: Burnell Blanks, MD;  Location: [facility];  Service: Cardiovascular;  Laterality: N/A;  . Total shoulder arthroplasty Left 09/29/2015  . Total shoulder arthroplasty Left 09/29/2015    Procedure: TOTAL LEFT SHOULDER ARTHROPLASTY;  Surgeon: Marchia Bond, MD;  Location: [facility];  Service: Orthopedics;  Laterality: Left;    There were no vitals filed for this visit.  Visit Diagnosis:  Left shoulder pain  Weakness of  left upper extremity  Stiffness due to immobility  Decreased strength, endurance, and mobility      Subjective Assessment - 02/09/16 1105    Subjective Shoulder is feeling OK. He did use his arms for some heavier yard work and had no problems. He has some intermittent soreness but more in the Rt shoulder than the Lt.    Currently in Pain? No/denies            [facility] PT Assessment - 02/09/16 0001    Assessment   Medical Diagnosis Lt TSA   Referring Provider Carter Kitten   Onset Date/Surgical Date 09/29/15   Hand Dominance Right   Prior Therapy for Rt shoulder    AROM   Right Shoulder Extension 47 Degrees   Right Shoulder Flexion 120 Degrees   Right Shoulder ABduction 105 Degrees   Right Shoulder Internal Rotation 22  Degrees   Right Shoulder External Rotation 70 Degrees   Left Shoulder Extension 50 Degrees   Left Shoulder Flexion 113 Degrees   Left Shoulder ABduction 107 Degrees   Left Shoulder Internal Rotation 30 Degrees   Left Shoulder External Rotation 67 Degrees   Strength   Left Shoulder ABduction 4+/5   Left Shoulder Internal Rotation 5/5   Left Shoulder External Rotation --  5-/5                     [facility] Adult PT Treatment/Exercise - 02/09/16 0001    Shoulder Exercises: Pulleys   Flexion Limitations 10 sec hold x 10 reps    ABduction Limitations 10 sec x 5    Shoulder Exercises: Therapy Ball   Other Therapy Ball Exercises ball on wall CW/CCW 30-60 sec x4 reps    Other Therapy Ball Exercises ball on wall chest and head height for closed chain activities to strengthen functionally for reaching and lifting    Shoulder Exercises: ROM/Strengthening   UBE (Upper Arm Bike) L3 x 3 min alt fwd/back   Wall Pushups 10 reps   Pushups Limitations hands on table working on weight shift onto hands to prepare for hands and knees activities                 PT Education - 02/09/16 1313    Education provided Yes   Education Details discussed HEP and importance of continuing with consistent HEP including stretching and strengthening; use of pulley; myofacial ball release work; functional activities; addressed questions reguarding home exercise program and activities.    Person(s) Educated Patient   Methods Explanation;Demonstration;Tactile cues;Verbal cues   Comprehension Verbalized understanding          PT Short Term Goals - 12/29/15 1316    PT SHORT TERM GOAL #4   Title Increase active shoudler ROM by 10-20 degrees in all planes 12/31/15   Time 6   Period Weeks   Status Achieved           PT Long Term Goals - 02/09/16 1319    PT LONG TERM GOAL #1   Title Patient reports using Lt UE for most normal functional activities that do not require lifting >5 lbs. 02/11/16    Time 12   Period Weeks   Status Achieved   PT LONG TERM GOAL #2   Title AROM Lt shoudler equal or greater than Rt shoudler 02/11/16   Time 12   Period Weeks   Status Achieved   PT LONG TERM GOAL #3   Title 4/5 to 5/5 Lt shoulder strength 02/11/16  Time 12   Period Weeks   Status Achieved   PT LONG TERM GOAL #4   Title I in HEP 02/11/16   Time 12   Period Weeks   Status Achieved   PT LONG TERM GOAL #5   Title Improve FOTO to </=37% limitation 02/11/16   Time 12   Period Weeks   Status Achieved               Plan - 02/09/16 1315    Clinical Impression Statement Jay Bradshaw continues to demonstrate gradual gains in mobility and strength. Functionally he has returned to most ADL's and has done some yard work as well. Jay Bradshaw should continue with HEP and this has been discussed at length with him. Jay Bradshaw will be discharged to I HEP and call with any questions or problems.    Pt will benefit from skilled therapeutic intervention in order to improve on the following deficits Decreased range of motion;Decreased strength;Decreased activity tolerance;Decreased endurance;Postural dysfunction;Improper body mechanics   Rehab Potential Good   PT Frequency 2x / week   PT Duration 12 weeks   PT Treatment/Interventions Patient/family education;Therapeutic activities;Therapeutic exercise;Manual techniques;Dry needling;Cryotherapy;Electrical Stimulation;Moist Heat;Ultrasound   PT Next Visit Plan DOS: 09/29/15 Patient  is s/p Lt TSA- progress with ROM; stabilization and strengthening per protocol/as indicated    PT Home Exercise Plan  D/C to I  HEP    Consulted and Agree with Plan of Care Patient        Problem List Patient Active Problem List   Diagnosis Date Noted  . Osteoarthritis of left shoulder 09/29/2015  . Precordial pain   . Abnormal stress test   . Osteoarthritis of right shoulder, primary 12/16/2014  . S/P shoulder replacement 12/16/2014  . Preop cardiovascular exam 09/17/2014  .  CAD (coronary artery disease) 01/29/2014  . HTN (hypertension) 01/29/2014  . Tremor 01/29/2014    Celyn Nilda Simmer PT, MPH 02/09/2016, 1:24 PM  [facility] [place], [place], [address] Phone: [phone]   Fax:  [phone]  Name: Jay Bradshaw MRN: [id number] Date of Birth: [birth year]  PHYSICAL THERAPY DISCHARGE SUMMARY  Visits from Start of Care: 11  Current functional level related to goals / functional outcomes: Excellent progress with functional  ROM and strength. Good improvement in posture and alignment. Pt has returned to most normal ADL's and is doing some light to moderate work in the yard.    Remaining deficits: Intermittent pain in bilat shoulders Rt > Lt; limited ROM; mild strength deficits Lt UE   Education / Equipment: HEP  Plan: Patient agrees to discharge.  Patient goals were met. Patient is being discharged due to meeting the stated rehab goals.  ?????    Celyn P. Helene Kelp PT, MPH 02/09/2016 1:24 PM

## 2016-03-08 DIAGNOSIS — M19012 Primary osteoarthritis, left shoulder: Secondary | ICD-10-CM | POA: Diagnosis not present

## 2016-04-07 ENCOUNTER — Telehealth: Payer: Self-pay | Admitting: Cardiovascular Disease

## 2016-04-07 NOTE — Telephone Encounter (Signed)
Pt is a Dr. Johnsie Cancel pt.  [place] did cath.  Will forward to Dr. Kyla Balzarine nurse.

## 2016-04-07 NOTE — Telephone Encounter (Signed)
New Message  Pt stated he was told to follow up w/ Dr Angelena Form- no recall/OV note- pt aware Fraser Din is out of office- stated he is fine with waiting for Pat to call back. Please call back and discuss.

## 2016-04-11 NOTE — Telephone Encounter (Signed)
Called patient about his message.Patient requesting an appointment for follow-up. Patient will have to call back, due to not having a calender with him at this time.

## 2016-06-01 DIAGNOSIS — M19012 Primary osteoarthritis, left shoulder: Secondary | ICD-10-CM | POA: Diagnosis not present

## 2016-08-05 DIAGNOSIS — R351 Nocturia: Secondary | ICD-10-CM | POA: Diagnosis not present

## 2016-08-05 DIAGNOSIS — R972 Elevated prostate specific antigen [PSA]: Secondary | ICD-10-CM | POA: Diagnosis not present

## 2016-08-05 DIAGNOSIS — N5201 Erectile dysfunction due to arterial insufficiency: Secondary | ICD-10-CM | POA: Diagnosis not present

## 2016-08-05 DIAGNOSIS — N401 Enlarged prostate with lower urinary tract symptoms: Secondary | ICD-10-CM | POA: Diagnosis not present

## 2016-08-24 NOTE — Progress Notes (Signed)
Cardiology Office Note    Date:  08/26/2016   ID:  Jay Bradshaw, DOB [birth year], MRN [id number]  PCP:  Velna Hatchet, MD  Cardiologist:  Dr. Johnsie Cancel  CC: follow up   History of Present Illness:  Jay Bradshaw is a 81 y.o. male with a history of non obstructive CAD, emphysema, essential tremor, hypothyroidism who presents to clinic for cardiology follow up.  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 He moved to [place] from [place]. He established care with Dr. Johnsie Cancel in 08/2015. He had reported having a cath in 2008 with no interventions. He was planning for shoulder surgery and was also have some chest pain. A myoview was ordered which was abnormal so he was referred for cardiac cath. LHC on 09/25/15 showed 20% prox RCA, 20% mRCA, 20% dLAD, 60% 1st Marg with moderate eccentric stenosis that did not appear to be flow limiting. Plan was for medical therapy and proceed with shoulder surgery. Per cath report, PCI would be straight forward to do if he had chest pain c/w angina moving forward.  Today he presents to clinic for follow up. He and his wife recently sold there home in [place] and had to be out in 6 weeks which was a stressor on him and his disabled wife. He has been having worsening chest pain that started when moving. He attributed this to the stress but now it is continuing to occur. It feels like he has a "tennis ball in his chest."  No radiation, associated SOB, nausea or diaphoresis. Worsening in frequency. Notices it is worse late in the evening after taking his Metoprolol. It is intermittent and comes and goes and is not related to exertion. No dyspnea on exertion. Some worsening fatigue.  He has also had some LE edema which is odd for him. No orthopnea or PND. He has had some mild dizziness but no syncope. No pain with walking.    Past Medical History:  Diagnosis Date  . Arthritis   . Balance problem   . Complication of anesthesia   . Constipation    takes Colace daily  as needed  . COPD (chronic obstructive pulmonary disease) ([place])   . Coronary artery disease    takes Metoprolol daily  . Emphysema lung ([place])   . Enlarged prostate    takes Tamsulosin daily  . Hyperlipidemia    takes Lovastatin daily  . Hypothyroidism    takes Synthroid daily  . Joint pain   . Osteoarthritis of left shoulder 09/29/2015  . Osteoarthritis of right shoulder, primary 12/16/2014  . PONV (postoperative nausea and vomiting)   . Productive cough   . Seasonal allergies   . Shortness of breath dyspnea   . Tremors of nervous system   . Urinary frequency    takes Ditropan daily    Past Surgical History:  Procedure Laterality Date  . CARDIAC CATHETERIZATION N/A 09/25/2015   Procedure: Left Heart Cath and Coronary Angiography;  Surgeon: Burnell Blanks, MD;  Location: [facility];  Service: Cardiovascular;  Laterality: N/A;  . CATARACT EXTRACTION  Aug and Sep 2011   first left eye, then right eye  . COLONOSCOPY  June 2008   Dr. Lurena Joiner ref by Ruthann Cancer  . excision of fibroma  October 1994   in skin on back of neck right of midline, Dede Query ref by Lidia Collum  . HERNIA REPAIR    . IRRIGATION AND DEBRIDEMENT SEBACEOUS CYST  2010   from the  back, [employer]  . LIPOMA EXCISION  Nov 2004   from neck, right side (near location of mandible cyst), Dr Lorrine Kin ref by [place]  . PROSTATE BIOPSY  2006   Dr. Leia Alf  . SHOULDER HEMI-ARTHROPLASTY Right 12/16/2014   Procedure: SHOULDER HEMI-ARTHROPLASTY;  Surgeon: Johnny Bridge, MD;  Location: [facility];  Service: Orthopedics;  Laterality: Right;  . SHOULDER SURGERY Right 12/16/2014   hemi-arthroplasty    dr Mardelle Matte  . thyroid adenoma removal  August 1970   right side, [facility]  . TOTAL SHOULDER ARTHROPLASTY Right 12/16/2014   Procedure: RIGHT TOTAL SHOULDER ARTHROPLASTY;  Surgeon: Johnny Bridge, MD;  Location: [facility];  Service: Orthopedics;  Laterality: Right;  . TOTAL SHOULDER  ARTHROPLASTY Left 09/29/2015  . TOTAL SHOULDER ARTHROPLASTY Left 09/29/2015   Procedure: TOTAL LEFT SHOULDER ARTHROPLASTY;  Surgeon: Marchia Bond, MD;  Location: [facility];  Service: Orthopedics;  Laterality: Left;  . vacuolar cyst removal     in right mandible fixed/filled, Denyce Robert ref by Synetta Shadow, DDS  . wisdom teeth extracted      Current Medications: Outpatient Medications Prior to Visit  Medication Sig Dispense Refill  . alendronate (FOSAMAX) 70 MG tablet Take 70 mg by mouth once a week. Take with a full glass of water on an empty stomach.    Marland Kitchen aspirin 81 MG tablet Take 81 mg by mouth daily.    . baclofen (LIORESAL) 10 MG tablet Take 1 tablet (10 mg total) by mouth 3 (three) times daily. As needed for muscle spasm 50 tablet 0  . bethanechol (URECHOLINE) 10 MG tablet Take 1 tablet (10 mg total) by mouth 3 (three) times daily. 30 tablet 0  . CALCIUM-MAGNESIUM PO Take 500-600 mg by mouth 2 (two) times daily.    . cholecalciferol (VITAMIN D) 400 UNITS TABS tablet Take 400 Units by mouth 2 (two) times daily.    . Coenzyme Q10 (CO Q10) 100 MG CAPS Take 200 mg by mouth daily.    Marland Kitchen docusate sodium (COLACE) 100 MG capsule Take 100 mg by mouth daily as needed for mild constipation.    Marland Kitchen HYDROcodone-acetaminophen (NORCO) 10-325 MG tablet Take 1-2 tablets by mouth every 6 (six) hours as needed for severe pain. 30 tablet 0  . levothyroxine (SYNTHROID, LEVOTHROID) 88 MCG tablet Take 88 mcg by mouth daily before breakfast.    . lovastatin (MEVACOR) 20 MG tablet Take 20 mg by mouth at bedtime.    . Melatonin 3 MG TABS Take 1 tablet by mouth at bedtime as needed (sleep).    . metoprolol succinate (TOPROL-XL) 50 MG 24 hr tablet Take 50 mg by mouth at bedtime. Patient reports that he takes when he gets up in the middle of the night. Take with or immediately following a meal.    . Multiple Vitamin (MULTIVITAMIN) capsule Take 1 capsule by mouth every other day.     . Multiple Vitamins-Minerals  (PRESERVISION AREDS 2) CAPS Take 1 capsule by mouth every other day.    . nitroGLYCERIN (NITROSTAT) 0.4 MG SL tablet Place 0.4 mg under the tongue every 5 (five) minutes as needed for chest pain (up to 3 doses MAX).    Marland Kitchen ondansetron (ZOFRAN) 4 MG tablet Take 1 tablet (4 mg total) by mouth every 8 (eight) hours as needed for nausea or vomiting. 30 tablet 0  . PSYLLIUM HUSK PO Take 1 capsule by mouth 3 (three) times daily as needed (depending on meals he ate that day).     Marland Kitchen  saw palmetto 160 MG capsule Take 160 mg by mouth 3 (three) times daily.    . sennosides-docusate sodium (SENOKOT-S) 8.6-50 MG tablet Take 2 tablets by mouth daily. 30 tablet 1  . tamsulosin (FLOMAX) 0.4 MG CAPS capsule Take 0.4 mg by mouth 2 (two) times daily.     . vitamin C (ASCORBIC ACID) 500 MG tablet Take 500 mg by mouth every other day.     . vitamin E (VITAMIN E) 400 UNIT capsule Take 300 Units by mouth every other day.      No facility-administered medications prior to visit.      Allergies:   Review of patient's allergies indicates no known allergies.   Social History   Social History  . Marital status: Married    Spouse name: N/A  . Number of children: N/A  . Years of education: N/A   Social History Main Topics  . Smoking status: Former Research scientist (life sciences)  . Smokeless tobacco: Never Used     Comment: quit smoking 58yrs ago  . Alcohol use Yes     Comment: weekly  . Drug use: No  . Sexual activity: No   Other Topics Concern  . None   Social History Narrative  . None     Family History:  The patient's family history includes Hypertension in his mother.     ROS:   Please see the history of present illness.    ROS All other systems reviewed and are negative.   PHYSICAL EXAM:   VS:  BP 120/60   Pulse (!) 54   Ht 6\' 1"  (1.854 m)   Wt 173 lb (78.5 kg)   BMI 22.82 kg/m    GEN: Well nourished, well developed, in no acute distress  HEENT: normal  Neck: no JVD, carotid bruits, or masses Cardiac: RRR; no  murmurs, rubs, or gallops,no edema  Respiratory:  clear to auscultation bilaterally, normal work of breathing GI: soft, nontender, nondistended, + BS MS: no deformity or atrophy  Skin: warm and dry, no rash Neuro:  Alert and Oriented x 3, Strength and sensation are intact Psych: euthymic mood, full affect  Wt Readings from Last 3 Encounters:  08/26/16 173 lb (78.5 kg)  09/29/15 178 lb 0.3 oz (80.7 kg)  09/25/15 178 lb (80.7 kg)      Studies/Labs Reviewed:   EKG:  EKG is ordered today.  The ekg ordered today demonstrates sinus bradycardia HR 54  Recent Labs: 09/30/2015: BUN 14; Creatinine, Ser 0.93; Hemoglobin 11.5; Platelets 205; Potassium 4.5; Sodium 132   Lipid Panel No results found for: CHOL, TRIG, HDL, CHOLHDL, VLDL, LDLCALC, LDLDIRECT  Additional studies/ records that were reviewed today include:  Myoview 09/21/16 Study Highlights   The left ventricular ejection fraction is normal (55-65%).  Nuclear stress EF: 65%.  ST segment depression was noted during stress in the II, III, aVF, V5 and V6 leads.  This is a low risk study.   Low risk stress nuclear study with ECG changes; small, moderate intensity, reversible inferior basal defect consistent with mild inferior ischemia; EF 65 with normal wall motion.      Cardiac catheterization 08/29/16  Conclusion    Prox RCA lesion, 20% stenosed.  Mid RCA lesion, 20% stenosed.  Dist LAD lesion, 20% stenosed.  The left ventricular systolic function is normal.  1st Mrg lesion, 60% stenosed. The vessel is small to moderate in size. The stenosis is eccentric and does not appear to be flow limiting.   Recommendations: He has a  moderate eccentric stenosis in the first OM branch. This is a small to medium sized vessel. The lesion does not appear to be flow limiting. With upcoming surgery, we have discussed medical management of his CAD for now. He would prefer to attempt medical management for now. Will proceed with surgery  next week. If he has chest pain c/w angina going forward, could bring him back for re-look cath and PCI of the OM which would be technically easy to do.      ASSESSMENT & PLAN:   CAD: LHC on 09/25/15 showed 20% prox RCA, 20% mRCA, 20% dLAD, 60% 1st Marg with moderate eccentric stenosis that did not appear to be flow limiting. Plan was for medical therapy so he could proceed with shoulder surgery. Per cath report, PCI would be straight forward to do if he had chest pain c/w angina moving forward. -- Continue ASA, statin and BB for now.  -- Plan for relook cath on Monday with Dr. Julianne Handler  Chest pain: he does have some chest pain similar to previous. It is a little atypical for angina so I discussed with Dr. Julianne Handler and he said it would be reasonable to do a re look cath. Will plan for re look cath on Monday.  I have reviewed the risks, indications, and alternatives to cardiac catheterization and possible angioplasty/stenting with the patient. Risks include but are not limited to bleeding, infection, vascular injury, stroke, myocardial infection, arrhythmia, kidney injury, radiation-related injury in the case of prolonged fluoroscopy use, emergency cardiac surgery, and death. The patient understands the risks of serious complication is low (123456).   Hypothyroidism: continue synthroid   HLD: continue statin   Essential tremor: continue BB   LE edema: now resolved. Will check a 2D ECHO   Medication Adjustments/Labs and Tests Ordered: Current medicines are reviewed at length with the patient today.  Concerns regarding medicines are outlined above.  Medication changes, Labs and Tests ordered today are listed in the Patient Instructions below. Patient Instructions  Medication Instructions:  Your physician recommends that you continue on your current medications as directed. Please refer to the Current Medication list given to you today.  Labwork: Your physician recommends that you have lab  work today. BMET, CBC, PT/INR  Testing/Procedures: Your physician has requested that you have an echocardiogram. Echocardiography is a painless test that uses sound waves to create images of your heart. It provides your doctor with information about the size and shape of your heart and how well your heart's chambers and valves are working. This procedure takes approximately one hour. There are no restrictions for this procedure.  Your physician has requested that you have a cardiac catheterization. Cardiac catheterization is used to diagnose and/or treat various heart conditions. Doctors may recommend this procedure for a number of different reasons. The most common reason is to evaluate chest pain. Chest pain can be a symptom of coronary artery disease (CAD), and cardiac catheterization can show whether plaque is narrowing or blocking your heart's arteries. This procedure is also used to evaluate the valves, as well as measure the blood flow and oxygen levels in different parts of your heart. For further information please visit [facility]tn. Please follow instruction sheet, as given.  Follow-Up: Your physician wants you to follow-up in: 6 months with Dr. Johnsie Cancel. You will receive a reminder letter in the mail two months in advance. If you don't receive a letter, please call our office to schedule the follow-up appointment.   If you need a  refill on your cardiac medications before your next appointment, please call your pharmacy.       Signed, Angelena Form, PA-C  08/26/2016 3:33 PM    [facility] [address], [place], [place]  [address] Phone: [phone]; Fax: [phone]

## 2016-08-26 ENCOUNTER — Ambulatory Visit (INDEPENDENT_AMBULATORY_CARE_PROVIDER_SITE_OTHER): Payer: Medicare HMO | Admitting: Physician Assistant

## 2016-08-26 ENCOUNTER — Encounter: Payer: Self-pay | Admitting: Physician Assistant

## 2016-08-26 VITALS — BP 120/60 | HR 54 | Ht 73.0 in | Wt 173.0 lb

## 2016-08-26 DIAGNOSIS — I25118 Atherosclerotic heart disease of native coronary artery with other forms of angina pectoris: Secondary | ICD-10-CM | POA: Diagnosis not present

## 2016-08-26 DIAGNOSIS — R600 Localized edema: Secondary | ICD-10-CM

## 2016-08-26 DIAGNOSIS — G250 Essential tremor: Secondary | ICD-10-CM | POA: Diagnosis not present

## 2016-08-26 DIAGNOSIS — E785 Hyperlipidemia, unspecified: Secondary | ICD-10-CM

## 2016-08-26 DIAGNOSIS — Z01812 Encounter for preprocedural laboratory examination: Secondary | ICD-10-CM

## 2016-08-26 LAB — CBC WITH DIFFERENTIAL/PLATELET
BASOS PCT: 1 %
Basophils Absolute: 62 cells/uL (ref 0–200)
EOS ABS: 248 {cells}/uL (ref 15–500)
Eosinophils Relative: 4 %
HEMATOCRIT: 39.7 % (ref 38.5–50.0)
HEMOGLOBIN: 12.7 g/dL — AB (ref 13.2–17.1)
LYMPHS ABS: 1798 {cells}/uL (ref 850–3900)
Lymphocytes Relative: 29 %
MCH: 29.1 pg (ref 27.0–33.0)
MCHC: 32 g/dL (ref 32.0–36.0)
MCV: 91.1 fL (ref 80.0–100.0)
MONO ABS: 744 {cells}/uL (ref 200–950)
MPV: 10.6 fL (ref 7.5–12.5)
Monocytes Relative: 12 %
NEUTROS ABS: 3348 {cells}/uL (ref 1500–7800)
Neutrophils Relative %: 54 %
Platelets: 253 10*3/uL (ref 140–400)
RBC: 4.36 MIL/uL (ref 4.20–5.80)
RDW: 14.2 % (ref 11.0–15.0)
WBC: 6.2 10*3/uL (ref 3.8–10.8)

## 2016-08-26 LAB — BASIC METABOLIC PANEL
BUN: 25 mg/dL (ref 7–25)
CHLORIDE: 103 mmol/L (ref 98–110)
CO2: 26 mmol/L (ref 20–31)
CREATININE: 0.93 mg/dL (ref 0.70–1.18)
Calcium: 9.1 mg/dL (ref 8.6–10.3)
Glucose, Bld: 94 mg/dL (ref 65–99)
POTASSIUM: 4.4 mmol/L (ref 3.5–5.3)
Sodium: 141 mmol/L (ref 135–146)

## 2016-08-26 NOTE — Patient Instructions (Addendum)
Medication Instructions:  Your physician recommends that you continue on your current medications as directed. Please refer to the Current Medication list given to you today.  Labwork: Your physician recommends that you have lab work today. BMET, CBC, PT/INR  Testing/Procedures: Your physician has requested that you have an echocardiogram. Echocardiography is a painless test that uses sound waves to create images of your heart. It provides your doctor with information about the size and shape of your heart and how well your heart's chambers and valves are working. This procedure takes approximately one hour. There are no restrictions for this procedure.  Your physician has requested that you have a cardiac catheterization. Cardiac catheterization is used to diagnose and/or treat various heart conditions. Doctors may recommend this procedure for a number of different reasons. The most common reason is to evaluate chest pain. Chest pain can be a symptom of coronary artery disease (CAD), and cardiac catheterization can show whether plaque is narrowing or blocking your heart's arteries. This procedure is also used to evaluate the valves, as well as measure the blood flow and oxygen levels in different parts of your heart. For further information please visit [facility]. Please follow instruction sheet, as given.  Follow-Up: Your physician wants you to follow-up in: 6 months with Dr. Johnsie Cancel. You will receive a reminder letter in the mail two months in advance. If you don't receive a letter, please call our office to schedule the follow-up appointment.   If you need a refill on your cardiac medications before your next appointment, please call your pharmacy.

## 2016-08-27 LAB — PROTIME-INR
INR: 1
Prothrombin Time: 11.1 s (ref 9.0–11.5)

## 2016-08-29 ENCOUNTER — Encounter (HOSPITAL_COMMUNITY)
Admission: RE | Disposition: A | Discharge status: Home / Self Care | Payer: Self-pay | Source: Ambulatory Visit | Attending: Cardiovascular Disease

## 2016-08-29 ENCOUNTER — Ambulatory Visit (HOSPITAL_COMMUNITY)
Admission: RE | Admit: 2016-08-29 | Discharge: 2016-08-29 | Disposition: A | Discharge status: Home / Self Care | Payer: Medicare HMO | Source: Ambulatory Visit | Attending: Cardiovascular Disease | Admitting: Cardiovascular Disease

## 2016-08-29 DIAGNOSIS — Z7982 Long term (current) use of aspirin: Secondary | ICD-10-CM | POA: Insufficient documentation

## 2016-08-29 DIAGNOSIS — I2510 Atherosclerotic heart disease of native coronary artery without angina pectoris: Secondary | ICD-10-CM | POA: Diagnosis not present

## 2016-08-29 DIAGNOSIS — J449 Chronic obstructive pulmonary disease, unspecified: Secondary | ICD-10-CM | POA: Insufficient documentation

## 2016-08-29 DIAGNOSIS — I25110 Atherosclerotic heart disease of native coronary artery with unstable angina pectoris: Secondary | ICD-10-CM | POA: Diagnosis present

## 2016-08-29 DIAGNOSIS — E785 Hyperlipidemia, unspecified: Secondary | ICD-10-CM | POA: Diagnosis not present

## 2016-08-29 DIAGNOSIS — E039 Hypothyroidism, unspecified: Secondary | ICD-10-CM | POA: Insufficient documentation

## 2016-08-29 DIAGNOSIS — K5900 Constipation, unspecified: Secondary | ICD-10-CM | POA: Diagnosis not present

## 2016-08-29 DIAGNOSIS — Z96612 Presence of left artificial shoulder joint: Secondary | ICD-10-CM | POA: Diagnosis not present

## 2016-08-29 DIAGNOSIS — N401 Enlarged prostate with lower urinary tract symptoms: Secondary | ICD-10-CM | POA: Insufficient documentation

## 2016-08-29 DIAGNOSIS — Z87891 Personal history of nicotine dependence: Secondary | ICD-10-CM | POA: Insufficient documentation

## 2016-08-29 DIAGNOSIS — Z96611 Presence of right artificial shoulder joint: Secondary | ICD-10-CM | POA: Diagnosis not present

## 2016-08-29 DIAGNOSIS — R350 Frequency of micturition: Secondary | ICD-10-CM | POA: Insufficient documentation

## 2016-08-29 DIAGNOSIS — R0789 Other chest pain: Secondary | ICD-10-CM | POA: Diagnosis not present

## 2016-08-29 DIAGNOSIS — I25118 Atherosclerotic heart disease of native coronary artery with other forms of angina pectoris: Secondary | ICD-10-CM

## 2016-08-29 DIAGNOSIS — G250 Essential tremor: Secondary | ICD-10-CM | POA: Diagnosis not present

## 2016-08-29 HISTORY — PX: CARDIAC CATHETERIZATION: SHX172

## 2016-08-29 LAB — POCT ACTIVATED CLOTTING TIME: ACTIVATED CLOTTING TIME: 202 s

## 2016-08-29 SURGERY — LEFT HEART CATH AND CORONARY ANGIOGRAPHY
Anesthesia: LOCAL

## 2016-08-29 MED ORDER — SODIUM CHLORIDE 0.9% FLUSH: 3.0000 mL | Freq: Two times a day (BID) | INTRAVENOUS | Status: DC

## 2016-08-29 MED ORDER — SODIUM CHLORIDE 0.9 % WEIGHT BASED INFUSION: 1.0000 mL/kg/h | INTRAVENOUS | Status: DC

## 2016-08-29 MED ORDER — HEPARIN (PORCINE) IN NACL 2-0.9 UNIT/ML-% IJ SOLN
INTRAMUSCULAR | Status: AC
  Filled 2016-08-29: qty 1000

## 2016-08-29 MED ORDER — HEPARIN SODIUM (PORCINE) 1000 UNIT/ML IJ SOLN
INTRAMUSCULAR | Status: AC
  Filled 2016-08-29: qty 1

## 2016-08-29 MED ORDER — ADENOSINE (DIAGNOSTIC) 140MCG/KG/MIN
INTRAVENOUS | Status: DC | PRN
  Administered 2016-08-29: 140 ug/kg/min via INTRAVENOUS

## 2016-08-29 MED ORDER — VERAPAMIL HCL 2.5 MG/ML IV SOLN
INTRAVENOUS | Status: AC
  Filled 2016-08-29: qty 2

## 2016-08-29 MED ORDER — LIDOCAINE HCL (PF) 1 % IJ SOLN
INTRAMUSCULAR | Status: DC | PRN
  Administered 2016-08-29: 3 mL

## 2016-08-29 MED ORDER — ASPIRIN 81 MG PO CHEW: 81.0000 mg | CHEWABLE_TABLET | ORAL | Status: DC

## 2016-08-29 MED ORDER — SODIUM CHLORIDE 0.9 % IV SOLN: 250.0000 mL | INTRAVENOUS | Status: DC | PRN

## 2016-08-29 MED ORDER — HEPARIN (PORCINE) IN NACL 2-0.9 UNIT/ML-% IJ SOLN
INTRAMUSCULAR | Status: AC
  Filled 2016-08-29: qty 500

## 2016-08-29 MED ORDER — FENTANYL CITRATE (PF) 100 MCG/2ML IJ SOLN
INTRAMUSCULAR | Status: AC
  Filled 2016-08-29: qty 2

## 2016-08-29 MED ORDER — SODIUM CHLORIDE 0.9 % WEIGHT BASED INFUSION
3.0000 mL/kg/h | INTRAVENOUS | Status: DC
  Administered 2016-08-29: 3 mL/kg/h via INTRAVENOUS

## 2016-08-29 MED ORDER — HEPARIN SODIUM (PORCINE) 1000 UNIT/ML IJ SOLN
INTRAMUSCULAR | Status: DC | PRN
  Administered 2016-08-29: 3000 [IU] via INTRAVENOUS
  Administered 2016-08-29: 5000 [IU] via INTRAVENOUS
  Administered 2016-08-29: 4000 [IU] via INTRAVENOUS

## 2016-08-29 MED ORDER — IOPAMIDOL (ISOVUE-370) INJECTION 76%
INTRAVENOUS | Status: AC
  Filled 2016-08-29: qty 100

## 2016-08-29 MED ORDER — SODIUM CHLORIDE 0.9 % IV SOLN: INTRAVENOUS | Status: AC

## 2016-08-29 MED ORDER — SODIUM CHLORIDE 0.9% FLUSH: 3.0000 mL | INTRAVENOUS | Status: DC | PRN

## 2016-08-29 MED ORDER — FENTANYL CITRATE (PF) 100 MCG/2ML IJ SOLN
INTRAMUSCULAR | Status: DC | PRN
  Administered 2016-08-29: 25 ug via INTRAVENOUS

## 2016-08-29 MED ORDER — IOPAMIDOL (ISOVUE-370) INJECTION 76%
INTRAVENOUS | Status: DC | PRN
  Administered 2016-08-29: 125 mL via INTRA_ARTERIAL

## 2016-08-29 MED ORDER — MIDAZOLAM HCL 2 MG/2ML IJ SOLN
INTRAMUSCULAR | Status: DC | PRN
  Administered 2016-08-29: 1 mg via INTRAVENOUS

## 2016-08-29 MED ORDER — ADENOSINE 12 MG/4ML IV SOLN
INTRAVENOUS | Status: AC
  Filled 2016-08-29: qty 4

## 2016-08-29 MED ORDER — MIDAZOLAM HCL 2 MG/2ML IJ SOLN
INTRAMUSCULAR | Status: AC
  Filled 2016-08-29: qty 2

## 2016-08-29 MED ORDER — VERAPAMIL HCL 2.5 MG/ML IV SOLN
INTRAVENOUS | Status: DC | PRN
  Administered 2016-08-29: 10 mL via INTRA_ARTERIAL

## 2016-08-29 MED ORDER — LIDOCAINE HCL (PF) 1 % IJ SOLN
INTRAMUSCULAR | Status: AC
  Filled 2016-08-29: qty 30

## 2016-08-29 MED ORDER — IOPAMIDOL (ISOVUE-370) INJECTION 76%
INTRAVENOUS | Status: AC
  Filled 2016-08-29: qty 50

## 2016-08-29 MED ORDER — ADENOSINE 12 MG/4ML IV SOLN
INTRAVENOUS | Status: AC
  Filled 2016-08-29: qty 16

## 2016-08-29 MED ORDER — HEPARIN (PORCINE) IN NACL 2-0.9 UNIT/ML-% IJ SOLN
INTRAMUSCULAR | Status: DC | PRN
  Administered 2016-08-29: 1500 mL

## 2016-08-29 SURGICAL SUPPLY — 16 items
CATH INFINITI 5 FR JL3.5 (CATHETERS) ×2 IMPLANT
CATH INFINITI 5FR ANG PIGTAIL (CATHETERS) ×2 IMPLANT
CATH INFINITI JR4 5F (CATHETERS) ×2 IMPLANT
CATH VISTA GUIDE 6FR JL3.5 (CATHETERS) ×2 IMPLANT
CATH VISTA GUIDE 6FR XB3.5 (CATHETERS) ×2 IMPLANT
DEVICE RAD COMP TR BAND LRG (VASCULAR PRODUCTS) ×2 IMPLANT
GLIDESHEATH SLEND SS 6F .021 (SHEATH) ×2 IMPLANT
GUIDEWIRE PRESSURE COMET II (WIRE) ×2 IMPLANT
KIT ESSENTIALS PG (KITS) ×2 IMPLANT
KIT HEART LEFT (KITS) ×2 IMPLANT
PACK CARDIAC CATHETERIZATION (CUSTOM PROCEDURE TRAY) ×2 IMPLANT
SYR MEDRAD MARK V 150ML (SYRINGE) ×2 IMPLANT
TRANSDUCER W/STOPCOCK (MISCELLANEOUS) ×2 IMPLANT
TUBING CIL FLEX 10 FLL-RA (TUBING) ×2 IMPLANT
WIRE HI TORQ VERSACORE-J 145CM (WIRE) ×2 IMPLANT
WIRE SAFE-T 1.5MM-J .035X260CM (WIRE) ×2 IMPLANT

## 2016-08-29 NOTE — Research (Signed)
CADLAD Informed Consent   Subject Name: Jay Bradshaw  Subject met inclusion and exclusion criteria.  The informed consent form, study requirements and expectations were reviewed with the subject and questions and concerns were addressed prior to the signing of the consent form.  The subject verbalized understanding of the trail requirements.  The subject agreed to participate in the CADLAD trial and signed the informed consent.  The informed consent was obtained prior to performance of any protocol-specific procedures for the subject.  A copy of the signed informed consent was given to the subject and a copy was placed in the subject's medical record.  [name] 08/29/2016, 8:17 AM

## 2016-08-29 NOTE — H&P (View-Only) (Signed)
Cardiology Office Note    Date:  08/26/2016   ID:  [name], DOB 04-26-1935, MRN [id number]  PCP:  Velna Hatchet, MD  Cardiologist:  Dr. Johnsie Cancel  CC: follow up   History of Present Illness:  Jay Bradshaw is a 81 y.o. male with a history of non obstructive CAD, emphysema, essential tremor, hypothyroidism who presents to clinic for cardiology follow up.  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 He moved to [place] from [place]. He established care with Dr. Johnsie Cancel in 08/2015. He had reported having a cath in 2008 with no interventions. He was planning for shoulder surgery and was also have some chest pain. A myoview was ordered which was abnormal so he was referred for cardiac cath. LHC on 09/25/15 showed 20% prox RCA, 20% mRCA, 20% dLAD, 60% 1st Marg with moderate eccentric stenosis that did not appear to be flow limiting. Plan was for medical therapy and proceed with shoulder surgery. Per cath report, PCI would be straight forward to do if he had chest pain c/w angina moving forward.  Today he presents to clinic for follow up. He and his wife recently sold there home in [place] and had to be out in 6 weeks which was a stressor on him and his disabled wife. He has been having worsening chest pain that started when moving. He attributed this to the stress but now it is continuing to occur. It feels like he has a "tennis ball in his chest."  No radiation, associated SOB, nausea or diaphoresis. Worsening in frequency. Notices it is worse late in the evening after taking his Metoprolol. It is intermittent and comes and goes and is not related to exertion. No dyspnea on exertion. Some worsening fatigue.  He has also had some LE edema which is odd for him. No orthopnea or PND. He has had some mild dizziness but no syncope. No pain with walking.    Past Medical History:  Diagnosis Date  . Arthritis   . Balance problem   . Complication of anesthesia   . Constipation    takes Colace daily  as needed  . COPD (chronic obstructive pulmonary disease) ([place])   . Coronary artery disease    takes Metoprolol daily  . Emphysema lung ([place])   . Enlarged prostate    takes Tamsulosin daily  . Hyperlipidemia    takes Lovastatin daily  . Hypothyroidism    takes Synthroid daily  . Joint pain   . Osteoarthritis of left shoulder 09/29/2015  . Osteoarthritis of right shoulder, primary 12/16/2014  . PONV (postoperative nausea and vomiting)   . Productive cough   . Seasonal allergies   . Shortness of breath dyspnea   . Tremors of nervous system   . Urinary frequency    takes Ditropan daily    Past Surgical History:  Procedure Laterality Date  . CARDIAC CATHETERIZATION N/A 09/25/2015   Procedure: Left Heart Cath and Coronary Angiography;  Surgeon: Burnell Blanks, MD;  Location: [facility];  Service: Cardiovascular;  Laterality: N/A;  . CATARACT EXTRACTION  Aug and Sep 2011   first left eye, then right eye  . COLONOSCOPY  June 2008   Dr. Lurena Joiner ref by Ruthann Cancer  . excision of fibroma  October 1994   in skin on back of neck right of midline, Dede Query ref by Lidia Collum  . HERNIA REPAIR    . IRRIGATION AND DEBRIDEMENT SEBACEOUS CYST  2010   from the  back, [employer]  . LIPOMA EXCISION  Nov 2004   from neck, right side (near location of mandible cyst), Dr Lorrine Kin ref by [place]  . PROSTATE BIOPSY  2006   Dr. Leia Alf  . SHOULDER HEMI-ARTHROPLASTY Right 12/16/2014   Procedure: SHOULDER HEMI-ARTHROPLASTY;  Surgeon: Johnny Bridge, MD;  Location: [facility];  Service: Orthopedics;  Laterality: Right;  . SHOULDER SURGERY Right 12/16/2014   hemi-arthroplasty    dr Mardelle Matte  . thyroid adenoma removal  August 1970   right side, [facility]  . TOTAL SHOULDER ARTHROPLASTY Right 12/16/2014   Procedure: RIGHT TOTAL SHOULDER ARTHROPLASTY;  Surgeon: Johnny Bridge, MD;  Location: [facility];  Service: Orthopedics;  Laterality: Right;  . TOTAL SHOULDER  ARTHROPLASTY Left 09/29/2015  . TOTAL SHOULDER ARTHROPLASTY Left 09/29/2015   Procedure: TOTAL LEFT SHOULDER ARTHROPLASTY;  Surgeon: Marchia Bond, MD;  Location: [facility];  Service: Orthopedics;  Laterality: Left;  . vacuolar cyst removal     in right mandible fixed/filled, Denyce Robert ref by Synetta Shadow, DDS  . wisdom teeth extracted      Current Medications: Outpatient Medications Prior to Visit  Medication Sig Dispense Refill  . alendronate (FOSAMAX) 70 MG tablet Take 70 mg by mouth once a week. Take with a full glass of water on an empty stomach.    Marland Kitchen aspirin 81 MG tablet Take 81 mg by mouth daily.    . baclofen (LIORESAL) 10 MG tablet Take 1 tablet (10 mg total) by mouth 3 (three) times daily. As needed for muscle spasm 50 tablet 0  . bethanechol (URECHOLINE) 10 MG tablet Take 1 tablet (10 mg total) by mouth 3 (three) times daily. 30 tablet 0  . CALCIUM-MAGNESIUM PO Take 500-600 mg by mouth 2 (two) times daily.    . cholecalciferol (VITAMIN D) 400 UNITS TABS tablet Take 400 Units by mouth 2 (two) times daily.    . Coenzyme Q10 (CO Q10) 100 MG CAPS Take 200 mg by mouth daily.    Marland Kitchen docusate sodium (COLACE) 100 MG capsule Take 100 mg by mouth daily as needed for mild constipation.    Marland Kitchen HYDROcodone-acetaminophen (NORCO) 10-325 MG tablet Take 1-2 tablets by mouth every 6 (six) hours as needed for severe pain. 30 tablet 0  . levothyroxine (SYNTHROID, LEVOTHROID) 88 MCG tablet Take 88 mcg by mouth daily before breakfast.    . lovastatin (MEVACOR) 20 MG tablet Take 20 mg by mouth at bedtime.    . Melatonin 3 MG TABS Take 1 tablet by mouth at bedtime as needed (sleep).    . metoprolol succinate (TOPROL-XL) 50 MG 24 hr tablet Take 50 mg by mouth at bedtime. Patient reports that he takes when he gets up in the middle of the night. Take with or immediately following a meal.    . Multiple Vitamin (MULTIVITAMIN) capsule Take 1 capsule by mouth every other day.     . Multiple Vitamins-Minerals  (PRESERVISION AREDS 2) CAPS Take 1 capsule by mouth every other day.    . nitroGLYCERIN (NITROSTAT) 0.4 MG SL tablet Place 0.4 mg under the tongue every 5 (five) minutes as needed for chest pain (up to 3 doses MAX).    Marland Kitchen ondansetron (ZOFRAN) 4 MG tablet Take 1 tablet (4 mg total) by mouth every 8 (eight) hours as needed for nausea or vomiting. 30 tablet 0  . PSYLLIUM HUSK PO Take 1 capsule by mouth 3 (three) times daily as needed (depending on meals he ate that day).     Marland Kitchen  saw palmetto 160 MG capsule Take 160 mg by mouth 3 (three) times daily.    . sennosides-docusate sodium (SENOKOT-S) 8.6-50 MG tablet Take 2 tablets by mouth daily. 30 tablet 1  . tamsulosin (FLOMAX) 0.4 MG CAPS capsule Take 0.4 mg by mouth 2 (two) times daily.     . vitamin C (ASCORBIC ACID) 500 MG tablet Take 500 mg by mouth every other day.     . vitamin E (VITAMIN E) 400 UNIT capsule Take 300 Units by mouth every other day.      No facility-administered medications prior to visit.      Allergies:   Review of patient's allergies indicates no known allergies.   Social History   Social History  . Marital status: Married    Spouse name: N/A  . Number of children: N/A  . Years of education: N/A   Social History Main Topics  . Smoking status: Former Research scientist (life sciences)  . Smokeless tobacco: Never Used     Comment: quit smoking 36yrs ago  . Alcohol use Yes     Comment: weekly  . Drug use: No  . Sexual activity: No   Other Topics Concern  . None   Social History Narrative  . None     Family History:  The patient's family history includes Hypertension in his mother.     ROS:   Please see the history of present illness.    ROS All other systems reviewed and are negative.   PHYSICAL EXAM:   VS:  BP 120/60   Pulse (!) 54   Ht 6\' 1"  (1.854 m)   Wt 173 lb (78.5 kg)   BMI 22.82 kg/m    GEN: Well nourished, well developed, in no acute distress  HEENT: normal  Neck: no JVD, carotid bruits, or masses Cardiac: RRR; no  murmurs, rubs, or gallops,no edema  Respiratory:  clear to auscultation bilaterally, normal work of breathing GI: soft, nontender, nondistended, + BS MS: no deformity or atrophy  Skin: warm and dry, no rash Neuro:  Alert and Oriented x 3, Strength and sensation are intact Psych: euthymic mood, full affect  Wt Readings from Last 3 Encounters:  08/26/16 173 lb (78.5 kg)  09/29/15 178 lb 0.3 oz (80.7 kg)  09/25/15 178 lb (80.7 kg)      Studies/Labs Reviewed:   EKG:  EKG is ordered today.  The ekg ordered today demonstrates sinus bradycardia HR 54  Recent Labs: 09/30/2015: BUN 14; Creatinine, Ser 0.93; Hemoglobin 11.5; Platelets 205; Potassium 4.5; Sodium 132   Lipid Panel No results found for: CHOL, TRIG, HDL, CHOLHDL, VLDL, LDLCALC, LDLDIRECT  Additional studies/ records that were reviewed today include:  Myoview 09/21/16 Study Highlights   The left ventricular ejection fraction is normal (55-65%).  Nuclear stress EF: 65%.  ST segment depression was noted during stress in the II, III, aVF, V5 and V6 leads.  This is a low risk study.   Low risk stress nuclear study with ECG changes; small, moderate intensity, reversible inferior basal defect consistent with mild inferior ischemia; EF 65 with normal wall motion.      Cardiac catheterization 08/29/16  Conclusion    Prox RCA lesion, 20% stenosed.  Mid RCA lesion, 20% stenosed.  Dist LAD lesion, 20% stenosed.  The left ventricular systolic function is normal.  1st Mrg lesion, 60% stenosed. The vessel is small to moderate in size. The stenosis is eccentric and does not appear to be flow limiting.   Recommendations: He has a  moderate eccentric stenosis in the first OM branch. This is a small to medium sized vessel. The lesion does not appear to be flow limiting. With upcoming surgery, we have discussed medical management of his CAD for now. He would prefer to attempt medical management for now. Will proceed with surgery  next week. If he has chest pain c/w angina going forward, could bring him back for re-look cath and PCI of the OM which would be technically easy to do.      ASSESSMENT & PLAN:   CAD: LHC on 09/25/15 showed 20% prox RCA, 20% mRCA, 20% dLAD, 60% 1st Marg with moderate eccentric stenosis that did not appear to be flow limiting. Plan was for medical therapy so he could proceed with shoulder surgery. Per cath report, PCI would be straight forward to do if he had chest pain c/w angina moving forward. -- Continue ASA, statin and BB for now.  -- Plan for relook cath on Monday with Dr. Julianne Handler  Chest pain: he does have some chest pain similar to previous. It is a little atypical for angina so I discussed with Dr. Julianne Handler and he said it would be reasonable to do a re look cath. Will plan for re look cath on Monday.  I have reviewed the risks, indications, and alternatives to cardiac catheterization and possible angioplasty/stenting with the patient. Risks include but are not limited to bleeding, infection, vascular injury, stroke, myocardial infection, arrhythmia, kidney injury, radiation-related injury in the case of prolonged fluoroscopy use, emergency cardiac surgery, and death. The patient understands the risks of serious complication is low (123456).   Hypothyroidism: continue synthroid   HLD: continue statin   Essential tremor: continue BB   LE edema: now resolved. Will check a 2D ECHO   Medication Adjustments/Labs and Tests Ordered: Current medicines are reviewed at length with the patient today.  Concerns regarding medicines are outlined above.  Medication changes, Labs and Tests ordered today are listed in the Patient Instructions below. Patient Instructions  Medication Instructions:  Your physician recommends that you continue on your current medications as directed. Please refer to the Current Medication list given to you today.  Labwork: Your physician recommends that you have lab  work today. BMET, CBC, PT/INR  Testing/Procedures: Your physician has requested that you have an echocardiogram. Echocardiography is a painless test that uses sound waves to create images of your heart. It provides your doctor with information about the size and shape of your heart and how well your heart's chambers and valves are working. This procedure takes approximately one hour. There are no restrictions for this procedure.  Your physician has requested that you have a cardiac catheterization. Cardiac catheterization is used to diagnose and/or treat various heart conditions. Doctors may recommend this procedure for a number of different reasons. The most common reason is to evaluate chest pain. Chest pain can be a symptom of coronary artery disease (CAD), and cardiac catheterization can show whether plaque is narrowing or blocking your heart's arteries. This procedure is also used to evaluate the valves, as well as measure the blood flow and oxygen levels in different parts of your heart. For further information please visit [email or web address]. Please follow instruction sheet, as given.  Follow-Up: Your physician wants you to follow-up in: 6 months with Dr. Johnsie Cancel. You will receive a reminder letter in the mail two months in advance. If you don't receive a letter, please call our office to schedule the follow-up appointment.   If you need a  refill on your cardiac medications before your next appointment, please call your pharmacy.       Signed, Angelena Form, PA-C  08/26/2016 3:33 PM    [facility] [place], [place], [place]  [address] Phone: [phone]; Fax: [phone]

## 2016-08-29 NOTE — Discharge Instructions (Signed)
Radial Site Care °Refer to this sheet in the next few weeks. These instructions provide you with information about caring for yourself after your procedure. Your health care provider may also give you more specific instructions. Your treatment has been planned according to current medical practices, but problems sometimes occur. Call your health care provider if you have any problems or questions after your procedure. °WHAT TO EXPECT AFTER THE PROCEDURE °After your procedure, it is typical to have the following: °· Bruising at the radial site that usually fades within 1-2 weeks. °· Blood collecting in the tissue (hematoma) that may be painful to the touch. It should usually decrease in size and tenderness within 1-2 weeks. °HOME CARE INSTRUCTIONS °· Take medicines only as directed by your health care provider. °· You may shower 24-48 hours after the procedure or as directed by your health care provider. Remove the bandage (dressing) and gently wash the site with plain soap and water. Pat the area dry with a clean towel. Do not rub the site, because this may cause bleeding. °· Do not take baths, swim, or use a hot tub until your health care provider approves. °· Check your insertion site every day for redness, swelling, or drainage. °· Do not apply powder or lotion to the site. °· Do not flex or bend the affected arm for 24 hours or as directed by your health care provider. °· Do not push or pull heavy objects with the affected arm for 24 hours or as directed by your health care provider. °· Do not lift over 10 lb (4.5 kg) for 5 days after your procedure or as directed by your health care provider. °· Ask your health care provider when it is okay to: °¨ Return to work or school. °¨ Resume usual physical activities or sports. °¨ Resume sexual activity. °· Do not drive home if you are discharged the same day as the procedure. Have someone else drive you. °· You may drive 24 hours after the procedure unless otherwise  instructed by your health care provider. °· Do not operate machinery or power tools for 24 hours after the procedure. °· If your procedure was done as an outpatient procedure, which means that you went home the same day as your procedure, a responsible adult should be with you for the first 24 hours after you arrive home. °· Keep all follow-up visits as directed by your health care provider. This is important. °SEEK MEDICAL CARE IF: °· You have a fever. °· You have chills. °· You have increased bleeding from the radial site. Hold pressure on the site. °SEEK IMMEDIATE MEDICAL CARE IF: °· You have unusual pain at the radial site. °· You have redness, warmth, or swelling at the radial site. °· You have drainage (other than a small amount of blood on the dressing) from the radial site. °· The radial site is bleeding, and the bleeding does not stop after 30 minutes of holding steady pressure on the site. °· Your arm or hand becomes pale, cool, tingly, or numb. °  °This information is not intended to replace advice given to you by your health care provider. Make sure you discuss any questions you have with your health care provider. °  °Document Released: 12/17/2010 Document Revised: 12/05/2014 Document Reviewed: 06/02/2014 °Elsevier Interactive Patient Education ©2016 [employer] ° °

## 2016-08-29 NOTE — Interval H&P Note (Signed)
History and Physical Interval Note:  08/29/2016 8:34 AM  Jay Bradshaw  has presented today for cardiac cath with the diagnosis of unstable angina. The various methods of treatment have been discussed with the patient and family. After consideration of risks, benefits and other options for treatment, the patient has consented to  Procedure(s): Left Heart Cath and Coronary Angiography (N/A) as a surgical intervention .  The patient's history has been reviewed, patient examined, no change in status, stable for surgery.  I have reviewed the patient's chart and labs.  Questions were answered to the patient's satisfaction.    Cath Lab Visit (complete for each Cath Lab visit)  Clinical Evaluation Leading to the Procedure:   ACS: No.  Non-ACS:    Anginal Classification: CCS III  Anti-ischemic medical therapy: Minimal Therapy (1 class of medications)  Non-Invasive Test Results: No non-invasive testing performed  Prior CABG: No previous CABG         Jay Bradshaw

## 2016-08-30 ENCOUNTER — Encounter (HOSPITAL_COMMUNITY): Payer: Self-pay | Admitting: Cardiovascular Disease

## 2016-08-31 ENCOUNTER — Encounter: Payer: Self-pay | Admitting: *Deleted

## 2016-09-08 ENCOUNTER — Ambulatory Visit (HOSPITAL_COMMUNITY): Payer: Medicare HMO | Attending: Cardiology

## 2016-09-08 ENCOUNTER — Other Ambulatory Visit: Payer: Self-pay

## 2016-09-08 DIAGNOSIS — Z9289 Personal history of other medical treatment: Secondary | ICD-10-CM

## 2016-09-08 DIAGNOSIS — I25118 Atherosclerotic heart disease of native coronary artery with other forms of angina pectoris: Secondary | ICD-10-CM | POA: Diagnosis not present

## 2016-09-08 HISTORY — DX: Personal history of other medical treatment: Z92.89

## 2016-09-09 DIAGNOSIS — E890 Postprocedural hypothyroidism: Secondary | ICD-10-CM | POA: Diagnosis not present

## 2016-09-09 DIAGNOSIS — E784 Other hyperlipidemia: Secondary | ICD-10-CM | POA: Diagnosis not present

## 2016-09-09 DIAGNOSIS — M810 Age-related osteoporosis without current pathological fracture: Secondary | ICD-10-CM | POA: Diagnosis not present

## 2016-09-09 DIAGNOSIS — Z125 Encounter for screening for malignant neoplasm of prostate: Secondary | ICD-10-CM | POA: Diagnosis not present

## 2016-09-10 DIAGNOSIS — Z23 Encounter for immunization: Secondary | ICD-10-CM | POA: Diagnosis not present

## 2016-09-19 DIAGNOSIS — R0789 Other chest pain: Secondary | ICD-10-CM | POA: Diagnosis not present

## 2016-09-19 DIAGNOSIS — N401 Enlarged prostate with lower urinary tract symptoms: Secondary | ICD-10-CM | POA: Diagnosis not present

## 2016-09-19 DIAGNOSIS — M810 Age-related osteoporosis without current pathological fracture: Secondary | ICD-10-CM | POA: Diagnosis not present

## 2016-09-19 DIAGNOSIS — Z0000 Encounter for general adult medical examination without abnormal findings: Secondary | ICD-10-CM | POA: Diagnosis not present

## 2016-09-19 DIAGNOSIS — E784 Other hyperlipidemia: Secondary | ICD-10-CM | POA: Diagnosis not present

## 2016-09-19 DIAGNOSIS — E890 Postprocedural hypothyroidism: Secondary | ICD-10-CM | POA: Diagnosis not present

## 2016-09-19 DIAGNOSIS — Z1389 Encounter for screening for other disorder: Secondary | ICD-10-CM | POA: Diagnosis not present

## 2016-09-19 DIAGNOSIS — R69 Illness, unspecified: Secondary | ICD-10-CM | POA: Diagnosis not present

## 2016-09-19 DIAGNOSIS — Z6824 Body mass index (BMI) 24.0-24.9, adult: Secondary | ICD-10-CM | POA: Diagnosis not present

## 2016-09-19 DIAGNOSIS — I2510 Atherosclerotic heart disease of native coronary artery without angina pectoris: Secondary | ICD-10-CM | POA: Diagnosis not present

## 2016-11-03 DIAGNOSIS — D2262 Melanocytic nevi of left upper limb, including shoulder: Secondary | ICD-10-CM | POA: Diagnosis not present

## 2016-11-03 DIAGNOSIS — D1801 Hemangioma of skin and subcutaneous tissue: Secondary | ICD-10-CM | POA: Diagnosis not present

## 2016-11-03 DIAGNOSIS — D225 Melanocytic nevi of trunk: Secondary | ICD-10-CM | POA: Diagnosis not present

## 2016-11-03 DIAGNOSIS — L821 Other seborrheic keratosis: Secondary | ICD-10-CM | POA: Diagnosis not present

## 2016-11-15 DIAGNOSIS — Z1212 Encounter for screening for malignant neoplasm of rectum: Secondary | ICD-10-CM | POA: Diagnosis not present

## 2016-12-13 DIAGNOSIS — M810 Age-related osteoporosis without current pathological fracture: Secondary | ICD-10-CM | POA: Diagnosis not present

## 2016-12-22 DIAGNOSIS — Z0100 Encounter for examination of eyes and vision without abnormal findings: Secondary | ICD-10-CM | POA: Diagnosis not present

## 2016-12-22 DIAGNOSIS — H35363 Drusen (degenerative) of macula, bilateral: Secondary | ICD-10-CM | POA: Diagnosis not present

## 2016-12-22 DIAGNOSIS — Z961 Presence of intraocular lens: Secondary | ICD-10-CM | POA: Diagnosis not present

## 2017-02-21 NOTE — Progress Notes (Signed)
Cardiology Office Note    Date:  02/24/2017   ID:  Shelly Shoultz, DOB 06/18/1935, MRN [id number]  PCP:  Velna Hatchet, MD  Cardiologist:  Dr. Johnsie Cancel  CC: follow up   History of Present Illness:  Jay Bradshaw is a 82 y.o. male with a history of non obstructive CAD, emphysema, essential tremor, hypothyroidism who presents to clinic for cardiology follow up.  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 He moved to [place] from [place]. He established care with me  in 08/2015. He had reported having a cath in 2008 with no interventions. He was planning for shoulder surgery and was also have some chest pain. A myoview was ordered which was abnormal so he was referred for cardiac cath. LHC on 09/25/15 showed 20% prox RCA, 20% mRCA, 20% dLAD, 60% 1st Marg with moderate eccentric stenosis that did not appear to be flow limiting. Plan was for medical therapy and proceed with shoulder surgery. Per cath report, PCI would be straight forward to do if he had chest pain c/w angina moving forward. Subsequently had uncomplicated left shoulder surgery Dr Mardelle Matte 10/02/2015   Seen by PA 08/2016 with chest pain Cath set up with Dr Angelena Form  Reviewed films:  Conclusion     Prox RCA lesion, 20 %stenosed.  Mid RCA lesion, 20 %stenosed.  Dist LAD lesion, 20 %stenosed.  The left ventricular systolic function is normal.  LV end diastolic pressure is normal.  The left ventricular ejection fraction is greater than 65% by visual estimate.  1st Mrg lesion, 60 %stenosed.   1. Mild non-obstructive disease in the LAD and RCA.  2. The obtuse marginal branch is a small to moderate caliber vessel (~1.75 mm). The stenosis in the OM branch appears moderate. FFR suggests the lesion is not flow limiting.  3. Normal LV systolic function  Recommendations: Continue medical management of CAD. I suspect that his chest pain is not cardiac related. Consider addition of Imdur if chest pain continues.         Echo: 09/08/16 EF 00-17% grade 2 diastolic dysfunction mild RAE Reviewed  Still with some constriction in chest Mostly at night before he goes to bed Not with activity sometimes associated with eating food too fast   Past Medical History:  Diagnosis Date  . Arthritis   . Balance problem   . Complication of anesthesia   . Constipation    takes Colace daily as needed  . COPD (chronic obstructive pulmonary disease) ([place])   . Coronary artery disease    takes Metoprolol daily  . Emphysema lung ([place])   . Enlarged prostate    takes Tamsulosin daily  . Hyperlipidemia    takes Lovastatin daily  . Hypothyroidism    takes Synthroid daily  . Joint pain   . Osteoarthritis of left shoulder 09/29/2015  . Osteoarthritis of right shoulder, primary 12/16/2014  . PONV (postoperative nausea and vomiting)   . Productive cough   . Seasonal allergies   . Shortness of breath dyspnea   . Tremors of nervous system   . Urinary frequency    takes Ditropan daily    Past Surgical History:  Procedure Laterality Date  . CARDIAC CATHETERIZATION N/A 09/25/2015   Procedure: Left Heart Cath and Coronary Angiography;  Surgeon: Burnell Blanks, MD;  Location: [facility];  Service: Cardiovascular;  Laterality: N/A;  . CARDIAC CATHETERIZATION N/A 08/29/2016   Procedure: Left Heart Cath and Coronary Angiography;  Surgeon: Burnell Blanks, MD;  Location: [facility];  Service: Cardiovascular;  Laterality: N/A;  . CATARACT EXTRACTION  Aug and Sep 2011   first left eye, then right eye  . COLONOSCOPY  June 2008   Dr. Lurena Joiner ref by Ruthann Cancer  . excision of fibroma  October 1994   in skin on back of neck right of midline, Dede Query ref by Lidia Collum  . HERNIA REPAIR    . IRRIGATION AND DEBRIDEMENT SEBACEOUS CYST  2010   from the back, [facility]  . LIPOMA EXCISION  Nov 2004   from neck, right side (near location of mandible cyst), Dr Lorrine Kin ref by Rasalen  . PROSTATE  BIOPSY  2006   Dr. Leia Alf  . SHOULDER HEMI-ARTHROPLASTY Right 12/16/2014   Procedure: SHOULDER HEMI-ARTHROPLASTY;  Surgeon: Johnny Bridge, MD;  Location: [facility];  Service: Orthopedics;  Laterality: Right;  . SHOULDER SURGERY Right 12/16/2014   hemi-arthroplasty    dr Mardelle Matte  . thyroid adenoma removal  August 1970   right side, O. Ptr.Schumacher/Hermann  . TOTAL SHOULDER ARTHROPLASTY Right 12/16/2014   Procedure: RIGHT TOTAL SHOULDER ARTHROPLASTY;  Surgeon: Johnny Bridge, MD;  Location: [facility];  Service: Orthopedics;  Laterality: Right;  . TOTAL SHOULDER ARTHROPLASTY Left 09/29/2015  . TOTAL SHOULDER ARTHROPLASTY Left 09/29/2015   Procedure: TOTAL LEFT SHOULDER ARTHROPLASTY;  Surgeon: Marchia Bond, MD;  Location: [facility];  Service: Orthopedics;  Laterality: Left;  . vacuolar cyst removal     in right mandible fixed/filled, Denyce Robert ref by Synetta Shadow, DDS  . wisdom teeth extracted      Current Medications: Outpatient Medications Prior to Visit  Medication Sig Dispense Refill  . alendronate (FOSAMAX) 70 MG tablet Take 70 mg by mouth every Saturday. Take with a full glass of water on an empty stomach.     Marland Kitchen aspirin 81 MG tablet Take 81 mg by mouth daily.    . beta carotene w/minerals (OCUVITE) tablet Take 1 tablet by mouth every other day.    Marland Kitchen CALCIUM-MAGNESIUM PO Take 500-600 mg by mouth 2 (two) times daily.    . cholecalciferol (VITAMIN D) 400 UNITS TABS tablet Take 400 Units by mouth 2 (two) times daily.    . Coenzyme Q10 (CO Q10) 100 MG CAPS Take 200 mg by mouth daily.    Marland Kitchen docusate sodium (COLACE) 100 MG capsule Take 100 mg by mouth daily as needed for mild constipation.    Marland Kitchen levothyroxine (SYNTHROID, LEVOTHROID) 88 MCG tablet Take 88 mcg by mouth daily before breakfast.    . lovastatin (MEVACOR) 20 MG tablet Take 20 mg by mouth every morning.     . Melatonin 3 MG TABS Take 1 tablet by mouth at bedtime as needed (sleep).    . metoprolol succinate (TOPROL-XL) 50 MG 24 hr  tablet Take 50 mg by mouth at bedtime.     . Multiple Vitamin (MULTIVITAMIN) capsule Take 1 capsule by mouth every other day.     . nitroGLYCERIN (NITROSTAT) 0.4 MG SL tablet Place 0.4 mg under the tongue every 5 (five) minutes as needed for chest pain (up to 3 doses MAX).    Marland Kitchen oxybutynin (DITROPAN) 5 MG tablet Take 1.25 mg by mouth 2 (two) times daily.    . PSYLLIUM HUSK PO Take 1 capsule by mouth 3 (three) times daily as needed (depending on meals he ate that day).     . saw palmetto 160 MG capsule Take 160 mg by mouth 3 (three) times daily.    Marland Kitchen  tamsulosin (FLOMAX) 0.4 MG CAPS capsule Take 0.4 mg by mouth 2 (two) times daily.     . vitamin C (ASCORBIC ACID) 500 MG tablet Take 500 mg by mouth every other day.     . vitamin E (VITAMIN E) 400 UNIT capsule Take 300 Units by mouth every other day.      No facility-administered medications prior to visit.      Allergies:   Patient has no known allergies.   Social History   Social History  . Marital status: Married    Spouse name: N/A  . Number of children: N/A  . Years of education: N/A   Social History Main Topics  . Smoking status: Former Research scientist (life sciences)  . Smokeless tobacco: Never Used     Comment: quit smoking 82yrs ago  . Alcohol use Yes     Comment: weekly  . Drug use: No  . Sexual activity: No   Other Topics Concern  . None   Social History Narrative  . None     Family History:  The patient's family history includes Hypertension in his mother.     ROS:   Please see the history of present illness.    ROS All other systems reviewed and are negative.   PHYSICAL EXAM:   VS:  BP (!) 100/56   Pulse 65   Ht 6\' 1"  (1.854 m)   Wt 182 lb 1.9 oz (82.6 kg)   SpO2 96%   BMI 24.03 kg/m    GEN: Well nourished, well developed, in no acute distress  HEENT: normal  Neck: no JVD, carotid bruits, or masses Cardiac: RRR; no murmurs, rubs, or gallops,no edema  Respiratory:  clear to auscultation bilaterally, normal work of  breathing GI: soft, nontender, nondistended, + BS MS: no deformity or atrophy  Skin: warm and dry, no rash Neuro:  Alert and Oriented x 3, Strength and sensation are intact Psych: euthymic mood, full affect  Wt Readings from Last 3 Encounters:  02/24/17 182 lb 1.9 oz (82.6 kg)  08/29/16 173 lb (78.5 kg)  08/26/16 173 lb (78.5 kg)      Studies/Labs Reviewed:   EKG:  08/26/16 sinus bradycardia HR 54  Recent Labs: 08/26/2016: BUN 25; Creat 0.93; Hemoglobin 12.7; Platelets 253; Potassium 4.4; Sodium 141   Lipid Panel No results found for: CHOL, TRIG, HDL, CHOLHDL, VLDL, LDLCALC, LDLDIRECT  Additional studies/ records that were reviewed today include:  Myoview 09/21/16 Study Highlights   The left ventricular ejection fraction is normal (55-65%).  Nuclear stress EF: 65%.  ST segment depression was noted during stress in the II, III, aVF, V5 and V6 leads.  This is a low risk study.   Low risk stress nuclear study with ECG changes; small, moderate intensity, reversible inferior basal defect consistent with mild inferior ischemia; EF 65 with normal wall motion.      Cardiac catheterization 08/29/16  Conclusion    Prox RCA lesion, 20% stenosed.  Mid RCA lesion, 20% stenosed.  Dist LAD lesion, 20% stenosed.  The left ventricular systolic function is normal.  1st Mrg lesion, 60% stenosed. The vessel is small to moderate in size. The stenosis is eccentric and does not appear to be flow limiting.   Recommendations: He has a moderate eccentric stenosis in the first OM branch. This is a small to medium sized vessel. The lesion does not appear to be flow limiting. With upcoming surgery, we have discussed medical management of his CAD for now. He would prefer to  attempt medical management for now. Will proceed with surgery next week. If he has chest pain c/w angina going forward, could bring him back for re-look cath and PCI of the OM which would be technically easy to do.       ASSESSMENT & PLAN:   CAD: LHC on 08/29/16 stable small distal OM disease medical Rx  Hypothyroidism: continue synthroid    HLD: continue statin   Essential tremor: continue BB   LE edema: now resolved. Echo with normal RV/LV function   Chest Pain my be related to esophageal spasm suggest GI  Referral and possible manometry Suggested taking nitro  Will help with spasm as well if does help can try oral imdur But BP is soft   F/U with me 3 months   Jenkins Rouge

## 2017-02-24 ENCOUNTER — Encounter: Payer: Self-pay | Admitting: Cardiovascular Disease

## 2017-02-24 ENCOUNTER — Ambulatory Visit (INDEPENDENT_AMBULATORY_CARE_PROVIDER_SITE_OTHER): Payer: Medicare HMO | Admitting: Cardiovascular Disease

## 2017-02-24 VITALS — BP 100/56 | HR 65 | Ht 73.0 in | Wt 182.1 lb

## 2017-02-24 DIAGNOSIS — I2510 Atherosclerotic heart disease of native coronary artery without angina pectoris: Secondary | ICD-10-CM | POA: Diagnosis not present

## 2017-02-24 NOTE — Patient Instructions (Addendum)
Medication Instructions:  Your physician recommends that you continue on your current medications as directed. Please refer to the Current Medication list given to you today.  Labwork: NONE  Testing/Procedures: NONE  Follow-Up: Your physician wants you to follow-up in: 3 months with Dr. Nishan.   If you need a refill on your cardiac medications before your next appointment, please call your pharmacy.    

## 2017-05-26 ENCOUNTER — Ambulatory Visit: Payer: Medicare HMO | Admitting: Cardiovascular Disease

## 2017-08-08 NOTE — Progress Notes (Signed)
Cardiology Office Note    Date:  08/17/2017   ID:  Jay Bradshaw, DOB 1935/03/01, MRN [id number]  PCP:  Velna Hatchet, MD  Cardiologist:  Dr. Johnsie Cancel  CC: follow up   History of Present Illness:  Jay Bradshaw is a 82 y.o. male with a history of non obstructive CAD, emphysema, essential tremor, hypothyroidism who presents to clinic for cardiology follow up.  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 He established care with me  in 08/2015. He had reported having a cath in 2008 with no interventions.  LHC on 09/25/15 showed 20% prox RCA, 20% mRCA, 20% dLAD, 60% 1st Marg with moderate eccentric stenosis that did not appear to be flow limiting. Per cath report, PCI would be straight forward to do if he had chest pain c/w angina moving forward. Subsequently had uncomplicated left shoulder surgery Dr Mardelle Matte 10/02/2015   Seen by PA 08/2016 with chest pain Cath set up with Dr Angelena Form  Reviewed films:  Some stress from wifes failing health No chest pain. Needs new nitro. Daughter lives in [place] And has 38 yo twins   Conclusion     Prox RCA lesion, 20 %stenosed.  Mid RCA lesion, 20 %stenosed.  Dist LAD lesion, 20 %stenosed.  The left ventricular systolic function is normal.  LV end diastolic pressure is normal.  The left ventricular ejection fraction is greater than 65% by visual estimate.  1st Mrg lesion, 60 %stenosed.   1. Mild non-obstructive disease in the LAD and RCA.  2. The obtuse marginal branch is a small to moderate caliber vessel (~1.75 mm). The stenosis in the OM branch appears moderate. FFR suggests the lesion is not flow limiting.  3. Normal LV systolic function  Recommendations: Continue medical management of CAD. I suspect that his chest pain is not cardiac related. Consider addition of Imdur if chest pain continues.       Echo: 09/08/16 EF 16-38% grade 2 diastolic dysfunction mild RAE Reviewed  Still with some  constriction in chest Mostly at night before he goes to bed Not with activity sometimes associated with eating food too fast   Past Medical History:  Diagnosis Date  . Arthritis   . Balance problem   . Complication of anesthesia   . Constipation    takes Colace daily as needed  . COPD (chronic obstructive pulmonary disease) ([place])   . Coronary artery disease    takes Metoprolol daily  . Emphysema lung ([place])   . Enlarged prostate    takes Tamsulosin daily  . Hyperlipidemia    takes Lovastatin daily  . Hypothyroidism    takes Synthroid daily  . Joint pain   . Osteoarthritis of left shoulder 09/29/2015  . Osteoarthritis of right shoulder, primary 12/16/2014  . PONV (postoperative nausea and vomiting)   . Productive cough   . Seasonal allergies   . Shortness of breath dyspnea   . Tremors of nervous system   . Urinary frequency    takes Ditropan daily    Past Surgical History:  Procedure Laterality Date  . CARDIAC CATHETERIZATION N/A 09/25/2015   Procedure: Left Heart Cath and Coronary Angiography;  Surgeon: Burnell Blanks, MD;  Location: [facility];  Service: Cardiovascular;  Laterality: N/A;  . CARDIAC CATHETERIZATION N/A 08/29/2016   Procedure: Left Heart Cath and Coronary Angiography;  Surgeon: Burnell Blanks, MD;  Location: [facility];  Service: Cardiovascular;  Laterality: N/A;  .  CATARACT EXTRACTION  Aug and Sep 2011   first left eye, then right eye  . COLONOSCOPY  June 2008   Dr. Lurena Joiner ref by Ruthann Cancer  . excision of fibroma  October 1994   in skin on back of neck right of midline, Dede Query ref by Lidia Collum  . HERNIA REPAIR    . IRRIGATION AND DEBRIDEMENT SEBACEOUS CYST  2010   from the back, [facility]  . LIPOMA EXCISION  Nov 2004   from neck, right side (near location of mandible cyst), Dr Lorrine Kin ref by Rasalen  . PROSTATE BIOPSY  2006   Dr. Leia Alf  . SHOULDER HEMI-ARTHROPLASTY Right 12/16/2014   Procedure:  SHOULDER HEMI-ARTHROPLASTY;  Surgeon: Johnny Bridge, MD;  Location: [facility];  Service: Orthopedics;  Laterality: Right;  . SHOULDER SURGERY Right 12/16/2014   hemi-arthroplasty    dr Mardelle Matte  . thyroid adenoma removal  August 1970   right side, O. Ptr.Schumacher/Hermann  . TOTAL SHOULDER ARTHROPLASTY Right 12/16/2014   Procedure: RIGHT TOTAL SHOULDER ARTHROPLASTY;  Surgeon: Johnny Bridge, MD;  Location: [facility];  Service: Orthopedics;  Laterality: Right;  . TOTAL SHOULDER ARTHROPLASTY Left 09/29/2015  . TOTAL SHOULDER ARTHROPLASTY Left 09/29/2015   Procedure: TOTAL LEFT SHOULDER ARTHROPLASTY;  Surgeon: Marchia Bond, MD;  Location: [facility];  Service: Orthopedics;  Laterality: Left;  . vacuolar cyst removal     in right mandible fixed/filled, Denyce Robert ref by Synetta Shadow, DDS  . wisdom teeth extracted      Current Medications: Outpatient Medications Prior to Visit  Medication Sig Dispense Refill  . alendronate (FOSAMAX) 70 MG tablet Take 70 mg by mouth every Saturday. Take with a full glass of water on an empty stomach.     Marland Kitchen aspirin 81 MG tablet Take 81 mg by mouth daily.    . beta carotene w/minerals (OCUVITE) tablet Take 1 tablet by mouth every other day.    Marland Kitchen CALCIUM-MAGNESIUM PO Take 500-800 mg by mouth 2 (two) times daily.     . cholecalciferol (VITAMIN D) 400 UNITS TABS tablet Take 400 Units by mouth 2 (two) times daily.    . Coenzyme Q10 (CO Q10) 100 MG CAPS Take 200 mg by mouth daily.    Marland Kitchen docusate sodium (COLACE) 100 MG capsule Take 100 mg by mouth daily as needed for mild constipation.    Marland Kitchen levothyroxine (SYNTHROID, LEVOTHROID) 88 MCG tablet Take 88 mcg by mouth daily before breakfast.    . lovastatin (MEVACOR) 20 MG tablet Take 20 mg by mouth every morning.     . Melatonin 3 MG TABS Take 1 tablet by mouth at bedtime as needed (sleep).    . metoprolol succinate (TOPROL-XL) 50 MG 24 hr tablet Take 50 mg by mouth at bedtime.     . Multiple Vitamin (MULTIVITAMIN) capsule Take 1  capsule by mouth every other day.     . oxybutynin (DITROPAN) 5 MG tablet Take 1.25 mg by mouth 2 (two) times daily.    Marland Kitchen POTASSIUM PO Take 99 mg by mouth 2 (two) times daily.    . PSYLLIUM HUSK PO Take 1 capsule by mouth 3 (three) times daily as needed (depending on meals he ate that day).     . saw palmetto 160 MG capsule Take 160 mg by mouth 3 (three) times daily.    . tamsulosin (FLOMAX) 0.4 MG CAPS capsule Take 0.4 mg by mouth 2 (two) times daily.     . vitamin C (ASCORBIC  ACID) 500 MG tablet Take 500 mg by mouth every other day.     . vitamin E (VITAMIN E) 400 UNIT capsule Take 300 Units by mouth every other day.     . nitroGLYCERIN (NITROSTAT) 0.4 MG SL tablet Place 0.4 mg under the tongue every 5 (five) minutes as needed for chest pain (up to 3 doses MAX).     No facility-administered medications prior to visit.      Allergies:   Patient has no known allergies.   Social History   Social History  . Marital status: Married    Spouse name: N/A  . Number of children: N/A  . Years of education: N/A   Social History Main Topics  . Smoking status: Former Research scientist (life sciences)  . Smokeless tobacco: Never Used     Comment: quit smoking 78yrs ago  . Alcohol use Yes     Comment: weekly  . Drug use: No  . Sexual activity: No   Other Topics Concern  . None   Social History Narrative  . None     Family History:  The patient's family history includes Hypertension in his mother.     ROS:   Please see the history of present illness.    ROS All other systems reviewed and are negative.   PHYSICAL EXAM:   VS:  BP (!) 108/56   Pulse (!) 55   Ht 6\' 1"  (1.854 m)   Wt 183 lb (83 kg)   BMI 24.14 kg/m    Affect appropriate Healthy:  appears stated age 68: normal Neck supple with no adenopathy JVP normal no bruits no thyromegaly Lungs clear with no wheezing and good diaphragmatic motion Heart:  S1/S2 no murmur, no rub, gallop or click PMI normal Abdomen: benighn, BS positve, no  tenderness, no AAA no bruit.  No HSM or HJR Distal pulses intact with no bruits No edema Neuro non-focal marked familial tremor arms and hands somewhat halting speech Skin warm and dry No muscular weakness  Wt Readings from Last 3 Encounters:  08/17/17 183 lb (83 kg)  02/24/17 182 lb 1.9 oz (82.6 kg)  08/29/16 173 lb (78.5 kg)      Studies/Labs Reviewed:   EKG:  08/26/16 sinus bradycardia HR 54 08/17/17 SR rate 55 normal   Recent Labs: 08/26/2016: BUN 25; Creat 0.93; Hemoglobin 12.7; Platelets 253; Potassium 4.4; Sodium 141   Lipid Panel No results found for: CHOL, TRIG, HDL, CHOLHDL, VLDL, LDLCALC, LDLDIRECT  Additional studies/ records that were reviewed today include:  Myoview 09/21/16 Study Highlights   The left ventricular ejection fraction is normal (55-65%).  Nuclear stress EF: 65%.  ST segment depression was noted during stress in the II, III, aVF, V5 and V6 leads.  This is a low risk study.   Low risk stress nuclear study with ECG changes; small, moderate intensity, reversible inferior basal defect consistent with mild inferior ischemia; EF 65 with normal wall motion.      Cardiac catheterization 08/29/16  Conclusion    Prox RCA lesion, 20% stenosed.  Mid RCA lesion, 20% stenosed.  Dist LAD lesion, 20% stenosed.  The left ventricular systolic function is normal.  1st Mrg lesion, 60% stenosed. The vessel is small to moderate in size. The stenosis is eccentric and does not appear to be flow limiting.   Recommendations: He has a moderate eccentric stenosis in the first OM branch. This is a small to medium sized vessel. The lesion does not appear to be flow limiting. With  upcoming surgery, we have discussed medical management of his CAD for now. He would prefer to attempt medical management for now. Will proceed with surgery next week. If he has chest pain c/w angina going forward, could bring him back for re-look cath and PCI of the OM which would be  technically easy to do.      ASSESSMENT & PLAN:   CAD: LHC on 08/29/16 stable small distal OM disease medical Rx new nitro called in   Hypothyroidism: continue synthroid    HLD: continue statin   Essential tremor: continue BB fairly prominent at this point  LE edema: now resolved. Echo with normal RV/LV function   Chest Pain my be related to esophageal spasm suggest GI  Referral and possible manometry Suggested taking nitro  Will help with spasm as well if does help can try oral imdur But BP is soft   F/U with me 6  months   Jenkins Rouge

## 2017-08-17 ENCOUNTER — Ambulatory Visit (INDEPENDENT_AMBULATORY_CARE_PROVIDER_SITE_OTHER): Payer: Medicare HMO | Admitting: Cardiovascular Disease

## 2017-08-17 ENCOUNTER — Encounter (INDEPENDENT_AMBULATORY_CARE_PROVIDER_SITE_OTHER): Payer: Self-pay

## 2017-08-17 ENCOUNTER — Encounter: Payer: Self-pay | Admitting: Cardiovascular Disease

## 2017-08-17 VITALS — BP 108/56 | HR 55 | Ht 73.0 in | Wt 183.0 lb

## 2017-08-17 DIAGNOSIS — I2510 Atherosclerotic heart disease of native coronary artery without angina pectoris: Secondary | ICD-10-CM | POA: Diagnosis not present

## 2017-08-17 MED ORDER — NITROGLYCERIN 0.4 MG SL SUBL: 0.4000 mg | SUBLINGUAL_TABLET | SUBLINGUAL | 3 refills | Status: AC | PRN

## 2017-08-17 NOTE — Patient Instructions (Addendum)

## 2017-08-18 DIAGNOSIS — R351 Nocturia: Secondary | ICD-10-CM | POA: Diagnosis not present

## 2017-08-18 DIAGNOSIS — N5201 Erectile dysfunction due to arterial insufficiency: Secondary | ICD-10-CM | POA: Diagnosis not present

## 2017-08-18 DIAGNOSIS — R972 Elevated prostate specific antigen [PSA]: Secondary | ICD-10-CM | POA: Diagnosis not present

## 2017-08-18 DIAGNOSIS — N401 Enlarged prostate with lower urinary tract symptoms: Secondary | ICD-10-CM | POA: Diagnosis not present

## 2017-08-26 DIAGNOSIS — Z23 Encounter for immunization: Secondary | ICD-10-CM | POA: Diagnosis not present

## 2017-09-01 DIAGNOSIS — N401 Enlarged prostate with lower urinary tract symptoms: Secondary | ICD-10-CM | POA: Diagnosis not present

## 2017-09-01 DIAGNOSIS — R351 Nocturia: Secondary | ICD-10-CM | POA: Diagnosis not present

## 2017-09-08 DIAGNOSIS — N401 Enlarged prostate with lower urinary tract symptoms: Secondary | ICD-10-CM | POA: Diagnosis not present

## 2017-09-08 DIAGNOSIS — R351 Nocturia: Secondary | ICD-10-CM | POA: Diagnosis not present

## 2017-09-25 DIAGNOSIS — E7849 Other hyperlipidemia: Secondary | ICD-10-CM | POA: Diagnosis not present

## 2017-09-25 DIAGNOSIS — E890 Postprocedural hypothyroidism: Secondary | ICD-10-CM | POA: Diagnosis not present

## 2017-09-25 DIAGNOSIS — R82998 Other abnormal findings in urine: Secondary | ICD-10-CM | POA: Diagnosis not present

## 2017-09-25 DIAGNOSIS — M859 Disorder of bone density and structure, unspecified: Secondary | ICD-10-CM | POA: Diagnosis not present

## 2017-09-25 DIAGNOSIS — Z125 Encounter for screening for malignant neoplasm of prostate: Secondary | ICD-10-CM | POA: Diagnosis not present

## 2017-10-02 ENCOUNTER — Other Ambulatory Visit: Payer: Self-pay | Admitting: Urology

## 2017-10-05 DIAGNOSIS — M810 Age-related osteoporosis without current pathological fracture: Secondary | ICD-10-CM | POA: Diagnosis not present

## 2017-10-05 DIAGNOSIS — R972 Elevated prostate specific antigen [PSA]: Secondary | ICD-10-CM | POA: Diagnosis not present

## 2017-10-05 DIAGNOSIS — E7849 Other hyperlipidemia: Secondary | ICD-10-CM | POA: Diagnosis not present

## 2017-10-05 DIAGNOSIS — N390 Urinary tract infection, site not specified: Secondary | ICD-10-CM | POA: Diagnosis not present

## 2017-10-05 DIAGNOSIS — N401 Enlarged prostate with lower urinary tract symptoms: Secondary | ICD-10-CM | POA: Diagnosis not present

## 2017-10-05 DIAGNOSIS — M859 Disorder of bone density and structure, unspecified: Secondary | ICD-10-CM | POA: Diagnosis not present

## 2017-10-05 DIAGNOSIS — R69 Illness, unspecified: Secondary | ICD-10-CM | POA: Diagnosis not present

## 2017-10-05 DIAGNOSIS — Z0000 Encounter for general adult medical examination without abnormal findings: Secondary | ICD-10-CM | POA: Diagnosis not present

## 2017-10-05 DIAGNOSIS — J984 Other disorders of lung: Secondary | ICD-10-CM | POA: Diagnosis not present

## 2017-10-09 DIAGNOSIS — R350 Frequency of micturition: Secondary | ICD-10-CM | POA: Diagnosis not present

## 2017-10-09 DIAGNOSIS — N401 Enlarged prostate with lower urinary tract symptoms: Secondary | ICD-10-CM | POA: Diagnosis not present

## 2017-10-09 DIAGNOSIS — N3000 Acute cystitis without hematuria: Secondary | ICD-10-CM | POA: Diagnosis not present

## 2017-10-12 ENCOUNTER — Ambulatory Visit (HOSPITAL_BASED_OUTPATIENT_CLINIC_OR_DEPARTMENT_OTHER): Admission: RE | Admit: 2017-10-12 | Payer: Medicare HMO | Source: Ambulatory Visit | Admitting: Urology

## 2017-10-12 ENCOUNTER — Encounter (HOSPITAL_BASED_OUTPATIENT_CLINIC_OR_DEPARTMENT_OTHER): Admission: RE | Payer: Self-pay | Source: Ambulatory Visit

## 2017-10-12 SURGERY — CYSTOSCOPY WITH INSERTION OF UROLIFT
Anesthesia: General

## 2017-10-24 DIAGNOSIS — Z1212 Encounter for screening for malignant neoplasm of rectum: Secondary | ICD-10-CM | POA: Diagnosis not present

## 2017-10-25 ENCOUNTER — Other Ambulatory Visit: Payer: Self-pay | Admitting: Urology

## 2017-11-07 ENCOUNTER — Encounter (HOSPITAL_BASED_OUTPATIENT_CLINIC_OR_DEPARTMENT_OTHER): Payer: Self-pay

## 2017-11-07 ENCOUNTER — Other Ambulatory Visit: Payer: Self-pay

## 2017-11-07 NOTE — Progress Notes (Signed)
Spoke with Mr. Grigorian No food after midnight, clear liquids up until 7 AM day of surgery , no gum, candy or mints.  Arrival time 11:15 AM DOS.  I stat 4 will be needed DOS, EKG in epic 08/17/17 printed and on chart.  The following medications will be taken the AM DOS: Levothyroxine, Lovastatin, & Flomax.  Pre op orders are in epic.  Edmonia Lynch [phone]) will accompany Mr. Sellman and be his ride home.

## 2017-11-09 ENCOUNTER — Ambulatory Visit (HOSPITAL_BASED_OUTPATIENT_CLINIC_OR_DEPARTMENT_OTHER): Payer: Medicare HMO | Admitting: Anesthesiology

## 2017-11-09 ENCOUNTER — Encounter (HOSPITAL_BASED_OUTPATIENT_CLINIC_OR_DEPARTMENT_OTHER)
Admission: RE | Disposition: A | Discharge status: Home / Self Care | Payer: Self-pay | Source: Ambulatory Visit | Attending: Urology

## 2017-11-09 ENCOUNTER — Encounter (HOSPITAL_BASED_OUTPATIENT_CLINIC_OR_DEPARTMENT_OTHER): Payer: Self-pay

## 2017-11-09 ENCOUNTER — Ambulatory Visit (HOSPITAL_BASED_OUTPATIENT_CLINIC_OR_DEPARTMENT_OTHER)
Admission: RE | Admit: 2017-11-09 | Discharge: 2017-11-09 | Disposition: A | Discharge status: Home / Self Care | Payer: Medicare HMO | Source: Ambulatory Visit | Attending: Urology | Admitting: Urology

## 2017-11-09 DIAGNOSIS — R69 Illness, unspecified: Secondary | ICD-10-CM | POA: Diagnosis not present

## 2017-11-09 DIAGNOSIS — J449 Chronic obstructive pulmonary disease, unspecified: Secondary | ICD-10-CM | POA: Insufficient documentation

## 2017-11-09 DIAGNOSIS — G250 Essential tremor: Secondary | ICD-10-CM | POA: Diagnosis not present

## 2017-11-09 DIAGNOSIS — N401 Enlarged prostate with lower urinary tract symptoms: Secondary | ICD-10-CM | POA: Diagnosis not present

## 2017-11-09 DIAGNOSIS — F419 Anxiety disorder, unspecified: Secondary | ICD-10-CM | POA: Insufficient documentation

## 2017-11-09 DIAGNOSIS — Z87891 Personal history of nicotine dependence: Secondary | ICD-10-CM | POA: Insufficient documentation

## 2017-11-09 DIAGNOSIS — I2510 Atherosclerotic heart disease of native coronary artery without angina pectoris: Secondary | ICD-10-CM | POA: Diagnosis not present

## 2017-11-09 DIAGNOSIS — Z79899 Other long term (current) drug therapy: Secondary | ICD-10-CM | POA: Insufficient documentation

## 2017-11-09 DIAGNOSIS — E039 Hypothyroidism, unspecified: Secondary | ICD-10-CM | POA: Insufficient documentation

## 2017-11-09 DIAGNOSIS — I10 Essential (primary) hypertension: Secondary | ICD-10-CM | POA: Insufficient documentation

## 2017-11-09 DIAGNOSIS — E785 Hyperlipidemia, unspecified: Secondary | ICD-10-CM | POA: Insufficient documentation

## 2017-11-09 DIAGNOSIS — N138 Other obstructive and reflux uropathy: Secondary | ICD-10-CM | POA: Insufficient documentation

## 2017-11-09 HISTORY — PX: CYSTOSCOPY WITH INSERTION OF UROLIFT: SHX6678

## 2017-11-09 HISTORY — DX: Personal history of other medical treatment: Z92.89

## 2017-11-09 HISTORY — DX: Anxiety disorder, unspecified: F41.9

## 2017-11-09 LAB — POCT I-STAT, CHEM 8
BUN: 20 mg/dL (ref 6–20)
CALCIUM ION: 1.26 mmol/L (ref 1.15–1.40)
CHLORIDE: 104 mmol/L (ref 101–111)
Creatinine, Ser: 0.9 mg/dL (ref 0.61–1.24)
GLUCOSE: 97 mg/dL (ref 65–99)
HCT: 40 % (ref 39.0–52.0)
Hemoglobin: 13.6 g/dL (ref 13.0–17.0)
Potassium: 4.4 mmol/L (ref 3.5–5.1)
SODIUM: 143 mmol/L (ref 135–145)
TCO2: 25 mmol/L (ref 22–32)

## 2017-11-09 SURGERY — CYSTOSCOPY WITH INSERTION OF UROLIFT
Anesthesia: General | Site: Bladder

## 2017-11-09 MED ORDER — DEXAMETHASONE SODIUM PHOSPHATE 10 MG/ML IJ SOLN
INTRAMUSCULAR | Status: AC
  Filled 2017-11-09: qty 1

## 2017-11-09 MED ORDER — LIDOCAINE 2% (20 MG/ML) 5 ML SYRINGE
INTRAMUSCULAR | Status: AC
  Filled 2017-11-09: qty 5

## 2017-11-09 MED ORDER — TAMSULOSIN HCL 0.4 MG PO CAPS
ORAL_CAPSULE | ORAL | Status: AC
  Filled 2017-11-09: qty 1

## 2017-11-09 MED ORDER — FENTANYL CITRATE (PF) 100 MCG/2ML IJ SOLN
INTRAMUSCULAR | Status: AC
  Filled 2017-11-09: qty 2

## 2017-11-09 MED ORDER — TRAMADOL HCL 50 MG PO TABS: 50.0000 mg | ORAL_TABLET | Freq: Four times a day (QID) | ORAL | 0 refills | Status: DC | PRN

## 2017-11-09 MED ORDER — ONDANSETRON HCL 4 MG/2ML IJ SOLN
INTRAMUSCULAR | Status: DC | PRN
  Administered 2017-11-09: 4 mg via INTRAVENOUS

## 2017-11-09 MED ORDER — PROPOFOL 10 MG/ML IV BOLUS
INTRAVENOUS | Status: AC
  Filled 2017-11-09: qty 20

## 2017-11-09 MED ORDER — CEFAZOLIN SODIUM-DEXTROSE 2-4 GM/100ML-% IV SOLN
2.0000 g | INTRAVENOUS | Status: AC
  Administered 2017-11-09: 2 g via INTRAVENOUS
  Filled 2017-11-09: qty 100

## 2017-11-09 MED ORDER — TAMSULOSIN HCL 0.4 MG PO CAPS
0.4000 mg | ORAL_CAPSULE | Freq: Once | ORAL | Status: DC
  Filled 2017-11-09: qty 1

## 2017-11-09 MED ORDER — ONDANSETRON HCL 4 MG/2ML IJ SOLN
INTRAMUSCULAR | Status: AC
  Filled 2017-11-09: qty 2

## 2017-11-09 MED ORDER — FENTANYL CITRATE (PF) 100 MCG/2ML IJ SOLN
25.0000 ug | INTRAMUSCULAR | Status: DC | PRN
  Administered 2017-11-09: 25 ug via INTRAVENOUS
  Filled 2017-11-09: qty 1

## 2017-11-09 MED ORDER — CEPHALEXIN 500 MG PO CAPS: 500.0000 mg | ORAL_CAPSULE | Freq: Two times a day (BID) | ORAL | 0 refills | Status: DC

## 2017-11-09 MED ORDER — ONDANSETRON HCL 4 MG/2ML IJ SOLN
4.0000 mg | Freq: Four times a day (QID) | INTRAMUSCULAR | Status: DC | PRN
  Filled 2017-11-09: qty 2

## 2017-11-09 MED ORDER — LIDOCAINE 2% (20 MG/ML) 5 ML SYRINGE
INTRAMUSCULAR | Status: DC | PRN
  Administered 2017-11-09: 100 mg via INTRAVENOUS

## 2017-11-09 MED ORDER — PROPOFOL 10 MG/ML IV BOLUS
INTRAVENOUS | Status: DC | PRN
  Administered 2017-11-09: 100 mg via INTRAVENOUS

## 2017-11-09 MED ORDER — LACTATED RINGERS IV SOLN
INTRAVENOUS | Status: DC
  Administered 2017-11-09 (×4): via INTRAVENOUS
  Filled 2017-11-09: qty 1000

## 2017-11-09 MED ORDER — STERILE WATER FOR IRRIGATION IR SOLN
Status: DC | PRN
  Administered 2017-11-09: 3000 mL

## 2017-11-09 MED ORDER — CEFAZOLIN SODIUM-DEXTROSE 2-4 GM/100ML-% IV SOLN
INTRAVENOUS | Status: AC
  Filled 2017-11-09: qty 100

## 2017-11-09 MED ORDER — DEXAMETHASONE SODIUM PHOSPHATE 4 MG/ML IJ SOLN
INTRAMUSCULAR | Status: DC | PRN
  Administered 2017-11-09: 10 mg via INTRAVENOUS

## 2017-11-09 MED ORDER — FENTANYL CITRATE (PF) 100 MCG/2ML IJ SOLN
INTRAMUSCULAR | Status: DC | PRN
  Administered 2017-11-09: 50 ug via INTRAVENOUS

## 2017-11-09 SURGICAL SUPPLY — 14 items
BAG DRAIN URO-CYSTO SKYTR STRL (DRAIN) ×2 IMPLANT
BAG URINE DRAINAGE (UROLOGICAL SUPPLIES) ×2 IMPLANT
BAG URINE LEG 19OZ MD ST LTX (BAG) ×2 IMPLANT
BAG URINE LEG 500ML (DRAIN) ×2 IMPLANT
CATH FOLEY 2WAY SLVR  5CC 18FR (CATHETERS)
CATH FOLEY 2WAY SLVR 5CC 18FR (CATHETERS) IMPLANT
CLOTH BEACON ORANGE TIMEOUT ST (SAFETY) ×2 IMPLANT
GLOVE BIO SURGEON STRL SZ8 (GLOVE) ×2 IMPLANT
GOWN STRL REUS W/TWL XL LVL3 (GOWN DISPOSABLE) ×2 IMPLANT
MANIFOLD NEPTUNE II (INSTRUMENTS) ×2 IMPLANT
PACK CYSTO (CUSTOM PROCEDURE TRAY) ×2 IMPLANT
SYSTEM UROLIFT (Male Continence) ×12 IMPLANT
TUBE CONNECTING 12X1/4 (SUCTIONS) ×2 IMPLANT
WATER STERILE IRR 3000ML UROMA (IV SOLUTION) ×4 IMPLANT

## 2017-11-09 NOTE — Transfer of Care (Signed)
  Last Vitals:  Vitals:   11/09/17 1105  BP: 140/70  Pulse: 64  Resp: 16  Temp: (!) 36.4 C  SpO2: 97%    Last Pain:  Vitals:   11/09/17 1105  TempSrc: Oral      Patients Stated Pain Goal: 7 (11/09/17 1140)  Immediate Anesthesia Transfer of Care Note  Patient: Jay Bradshaw  Procedure(s) Performed: Procedure(s) (LRB): CYSTOSCOPY WITH INSERTION OF UROLIFT (N/A)  Patient Location: PACU  Anesthesia Type: General  Level of Consciousness: awake, alert  and oriented  Airway & Oxygen Therapy: Patient Spontanous Breathing and Patient connected to nasal cannula oxygen  Post-op Assessment: Report given to PACU RN and Post -op Vital signs reviewed and stable  Post vital signs: Reviewed and stable  Complications: No apparent anesthesia complications

## 2017-11-09 NOTE — Anesthesia Procedure Notes (Signed)
Procedure Name: LMA Insertion Date/Time: 11/09/2017 1:30 PM Performed by: Albertha Ghee, MD Pre-anesthesia Checklist: Patient identified, Emergency Drugs available, Suction available and Patient being monitored Patient Re-evaluated:Patient Re-evaluated prior to induction Oxygen Delivery Method: Circle system utilized Preoxygenation: Pre-oxygenation with 100% oxygen Induction Type: IV induction Ventilation: Mask ventilation without difficulty LMA: LMA inserted LMA Size: 4.0 Number of attempts: 1 Airway Equipment and Method: Bite block Placement Confirmation: positive ETCO2 Tube secured with: Tape Dental Injury: Teeth and Oropharynx as per pre-operative assessment

## 2017-11-09 NOTE — Progress Notes (Signed)
Repeat bladder scan revealed 139 ml urine. Still unable to void. Iv continues and has good oral intake.

## 2017-11-09 NOTE — Progress Notes (Signed)
Bladder scan reveals >300 ml of urine and unable to void. Inserted 16 Fr. foley cath without difficulty. Tolerated well. Additional teaching done.

## 2017-11-09 NOTE — Interval H&P Note (Signed)
History and Physical Interval Note:  11/09/2017 12:41 PM  Jay Bradshaw  has presented today for surgery, with the diagnosis of BENIGN PROSTATIC HYPERPLASIA  The various methods of treatment have been discussed with the patient and family. After consideration of risks, benefits and other options for treatment, the patient has consented to  Procedure(s): CYSTOSCOPY WITH INSERTION OF UROLIFT (N/A) as a surgical intervention .  The patient's history has been reviewed, patient examined, no change in status, stable for surgery.  I have reviewed the patient's chart and labs.  Questions were answered to the patient's satisfaction.     Lillette Boxer [name]

## 2017-11-09 NOTE — Anesthesia Preprocedure Evaluation (Signed)
Anesthesia Evaluation  Patient identified by MRN, date of birth, ID band Patient awake    Reviewed: Allergy & Precautions, H&P , NPO status , Patient's Chart, lab work & pertinent test results  History of Anesthesia Complications (+) PONV and history of anesthetic complications  Airway Mallampati: II   Neck ROM: full    Dental   Pulmonary shortness of breath, COPD, former smoker,    breath sounds clear to auscultation       Cardiovascular hypertension, + CAD   Rhythm:regular Rate:Normal     Neuro/Psych Anxiety    GI/Hepatic   Endo/Other  Hypothyroidism   Renal/GU      Musculoskeletal  (+) Arthritis ,   Abdominal   Peds  Hematology   Anesthesia Other Findings   Reproductive/Obstetrics                             Anesthesia Physical Anesthesia Plan  ASA: III  Anesthesia Plan: General   Post-op Pain Management:    Induction: Intravenous  PONV Risk Score and Plan: 3 and Ondansetron, Dexamethasone and Treatment may vary due to age or medical condition  Airway Management Planned: LMA  Additional Equipment:   Intra-op Plan:   Post-operative Plan:   Informed Consent: I have reviewed the patients History and Physical, chart, labs and discussed the procedure including the risks, benefits and alternatives for the proposed anesthesia with the patient or authorized representative who has indicated his/her understanding and acceptance.     Plan Discussed with: CRNA, Anesthesiologist and Surgeon  Anesthesia Plan Comments:         Anesthesia Quick Evaluation

## 2017-11-09 NOTE — Progress Notes (Signed)
Has been up to the bathroom multiple times to attempt to void without success. Dr. Diona Fanti in to see. Bladder scan revealed 100 ml. Orders given for foley if necessary.

## 2017-11-09 NOTE — Op Note (Signed)
Preoperative diagnosis: BPH with obstructive symptomatology.  Postoperative diagnosis: Same  Principal procedure: Urolift procedure, with the placement of 6 implants (7th opened/not implanted).  Surgeon: Diona Fanti  Anesthesia: Gen. with LMA  Complications: None  Estimated blood loss: Less than 25 mL  Indications: 82 -year-old male with obstructive symptomatology secondary to BPH.  The patient's symptoms have progressed, and he has requested further management.  Management options including TURP with resection/ablation of the prostate as well as Urolift were discussed.  The patient has chosen to have a Urolift procedure.  He has been instructed to the procedure as well as risks and complications which include but are not limited to infection, bleeding, and inadequate treatment with the Urolift procedure alone, anesthetic complications, among others.  He understands these and desires to proceed.  Findings: Using the 17 French cystoscope, urethra and bladder were inspected.  There were no urethral lesions.  Prostatic urethra was obstructed secondary to bilobar hypertrophy.  The bladder was inspected circumferentially.  This revealed normal findings.  Description of procedure: The patient was properly identified in the holding area.  He received preoperative IV antibiotics.  He was taken to the operating room where general anesthetic was administered with the LMA.  He is placed in the dorsolithotomy position.  Genitalia and perineum were prepped and draped.  Proper timeout was performed.  A 78F cystoscope was inserted into the bladder. The cystoscopy bridge was replaced with a UroLift delivery device.The first treatment site was the patient's right side approximately 1.5cm distal to the bladder neck. The distal tip of the delivery device was then angled laterally approximately 20 degrees at this position to compress the lateral lobe. The trigger was pulled, thereby deploying a needle containing the  implant through the prostate. The needle was then retracted, allowing one end of the implant to be delivered to the capsular surface of the prostate. The implant was then tensioned to assure capsular seating and removal of slack monofilament. The device was then angled back toward midline and slowly advanced proximally until cystoscopic verification of the monofilament being centered in the delivery bay. The urethral end piece was then affixed to the monofilament thereby tailoring the size of the implant. Excess filament was then severed. The delivery device was then re-advanced into the bladder. The delivery device was then replaced with cystoscope and bridge and the implant location and opening effect was confirmed cystoscopically. The same procedure was then repeated on the left side, and 4 additional implants were delivered--2 just proximal to the verumontanum bilaterally, and then 2 more in the right mid prostate.  A total of 6 implants were utilized, 1/7 implant was open but not utilized.   A final cystoscopy was conducted first to inspect the location and state of each implant and second, to confirm the presence of a continuous anterior channel was present through the prostatic urethra with irrigation flow turned off. 6 Implants were delivered in total.  Following this, the scope was removed after approximately 100 cc of water was placed in the bladder.   He was then awakened and taken to the PACU in stable condition.  He tolerated the procedure well.

## 2017-11-09 NOTE — Discharge Instructions (Signed)
1. You may see some blood in the urine and may have some burning with urination for 48-72 hours. You also may notice that you have to urinate more frequently or urgently after your procedure which is normal.  2. You should call should you develop an inability urinate, fever > 101, persistent nausea and vomiting that prevents you from eating o 3. If you have a catheter, you will be taught how to take care of the catheter by the nursing staff prior to discharge from the hospital.  You may periodically feel a strong urge to void with the catheter in place.  This is a bladder spasm and most often can occur when having a bowel movement or moving around. It is typically self-limited and usually will stop after a few minutes.  You may use some Vaseline or Neosporin around the tip of the catheter to reduce friction at the tip of the penis. You may also see some blood in the urine.  A very small amount of blood can make the urine look quite red.  As long as the catheter is draining well, there usually is not a problem.  However, if the catheter is not draining well and is bloody, you should call the office [phone]) to notify us. 4. It is okay to stop taking the tamsulosin in 2-3 days if you are voiding well 5. Expect some bloody urethral drainage over the next few days.  Additionally, you will see blood in your urine, especially initially in the stream, for a few days  CYSTOSCOPY HOME CARE INSTRUCTIONS  Activity: Rest for the remainder of the day.  Do not drive or operate equipment today.  You may resume normal activities in one to two days as instructed by your physician.   Meals: Drink plenty of liquids and eat light foods such as gelatin or soup this evening.  You may return to a normal meal plan tomorrow.  Return to Work: You may return to work in one to two days or as instructed by your physician.  Special Instructions / Symptoms: Call your physician if any of these symptoms  occur:   -persistent or heavy bleeding  -large blood clots that are difficult to pass  -urine stream diminishes or stops completely  -fever equal to or higher than 101 degrees Farenheit.  -cloudy urine with a strong, foul odor  -severe pain  Females should always wipe from front to back after elimination.  You may feel some burning pain when you urinate.  This should disappear with time.  Applying moist heat to the lower abdomen or a hot tub bath may help relieve the pain. \  Post Anesthesia Home Care Instructions  Activity: Get plenty of rest for the remainder of the day. A responsible individual must stay with you for 24 hours following the procedure.  For the next 24 hours, DO NOT: -Drive a car -Paediatric nurse -Drink alcoholic beverages -Take any medication unless instructed by your physician -Make any legal decisions or sign important papers.  Meals: Start with liquid foods such as gelatin or soup. Progress to regular foods as tolerated. Avoid greasy, spicy, heavy foods. If nausea and/or vomiting occur, drink only clear liquids until the nausea and/or vomiting subsides. Call your physician if vomiting continues.  Special Instructions/Symptoms: Your throat may feel dry or sore from the anesthesia or the breathing tube placed in your throat during surgery. If this causes discomfort, gargle with warm salt water. The discomfort should disappear within 24 hours.  If  you had a scopolamine patch placed behind your ear for the management of post- operative nausea and/or vomiting:  1. The medication in the patch is effective for 72 hours, after which it should be removed.  Wrap patch in a tissue and discard in the trash. Wash hands thoroughly with soap and water. 2. You may remove the patch earlier than 72 hours if you experience unpleasant side effects which may include dry mouth, dizziness or visual disturbances. 3. Avoid touching the patch. Wash your hands with soap and water after  contact with the patch.

## 2017-11-09 NOTE — H&P (Signed)
H&P  Chief Complaint: Large prostate with difficulty urinating  History of Present Illness: 82 year old male presents at this time for Urolift procedure.  He has significant urinary symptomatology with I PSS score of 18 despite being on medical therapy.  Cystoscopy revealed obstructing bilobar hypertrophy with bladder changes consistent with obstruction.  Prostatic volume measured by ultrasound was approximately 55 mL.  We have discussed treatment options with him, and he is chosen Urolift procedure.  Past Medical History:  Diagnosis Date  . Anxiety   . Arthritis   . Balance problem    more noticed in the dark  . Complication of anesthesia    opioids causes urine retention  . Constipation    takes Colace daily as needed  . COPD (chronic obstructive pulmonary disease) ([place])   . Coronary artery disease    takes Metoprolol daily, Prox RCA lesion, 20 %stenosed, Mid RCA lesion 20% stenosed, Dist LAD lesion 20% stenosed EF greater than 65%,, 1st mrg lesion 60% stenosed, mild non-obstructive disease in LAD and RCA, Stenosis of OM Branch appears moderate  . Emphysema lung ([place])   . Enlarged prostate    takes Tamsulosin daily  . History of echocardiogram 09/08/2016   EF 24-26% grade 2 diastolic dysfunction mild RAE Reviewed  . Hyperlipidemia    takes Lovastatin daily  . Hypothyroidism    takes Synthroid daily  . Joint pain   . Osteoarthritis of left shoulder 09/29/2015  . Osteoarthritis of right shoulder, primary 12/16/2014  . PONV (postoperative nausea and vomiting)   . Productive cough   . Seasonal allergies   . Shortness of breath dyspnea   . Tremors of nervous system    essential tremors  . Urinary frequency    takes Ditropan daily    Past Surgical History:  Procedure Laterality Date  . CARDIAC CATHETERIZATION N/A 09/25/2015   Procedure: Left Heart Cath and Coronary Angiography;  Surgeon: Burnell Blanks, MD;  Location: [place] [facility];  Service: Cardiovascular;   Laterality: N/A;  . CARDIAC CATHETERIZATION N/A 08/29/2016   Procedure: Left Heart Cath and Coronary Angiography;  Surgeon: Burnell Blanks, MD;  Location: [place] [facility];  Service: Cardiovascular;  Laterality: N/A;  . CATARACT EXTRACTION  Aug and Sep 2011   first left eye, then right eye  . COLONOSCOPY  June 2008   Dr. Lurena Joiner ref by Ruthann Cancer  . excision of fibroma  October 1994   in skin on back of neck right of midline, Dede Query ref by Lidia Collum  . HERNIA REPAIR    . IRRIGATION AND DEBRIDEMENT SEBACEOUS CYST  2010   from the back, [facility]  . LIPOMA EXCISION  Nov 2004   from neck, right side (near location of mandible cyst), Dr Lorrine Kin ref by Rasalen  . PROSTATE BIOPSY  2006   Dr. Leia Alf  . SHOULDER HEMI-ARTHROPLASTY Right 12/16/2014   Procedure: SHOULDER HEMI-ARTHROPLASTY;  Surgeon: Johnny Bridge, MD;  Location: [place];  Service: Orthopedics;  Laterality: Right;  . SHOULDER SURGERY Right 12/16/2014   hemi-arthroplasty    dr Mardelle Matte  . thyroid adenoma removal  August 1970   right side, [facility]  . TOTAL SHOULDER ARTHROPLASTY Right 12/16/2014   Procedure: RIGHT TOTAL SHOULDER ARTHROPLASTY;  Surgeon: Johnny Bridge, MD;  Location: [place];  Service: Orthopedics;  Laterality: Right;  . TOTAL SHOULDER ARTHROPLASTY Left 09/29/2015  . TOTAL SHOULDER ARTHROPLASTY Left 09/29/2015   Procedure: TOTAL LEFT SHOULDER ARTHROPLASTY;  Surgeon: Marchia Bond,  MD;  Location: [place];  Service: Orthopedics;  Laterality: Left;  . vacuolar cyst removal     in right mandible fixed/filled, Denyce Robert ref by Synetta Shadow, DDS  . wisdom teeth extracted      Home Medications:  Allergies as of 11/09/2017      Reactions   Oxycodone Other (See Comments)   Difficulty urinating for 24 plus hours      Medication List    Notice   Cannot display discharge medications because the patient has not yet been admitted.     Allergies:  Allergies  Allergen  Reactions  . Oxycodone Other (See Comments)    Difficulty urinating for 24 plus hours    Family History  Problem Relation Age of Onset  . Hypertension Mother     Social History:  reports that he has quit smoking. he has never used smokeless tobacco. He reports that he drinks alcohol. He reports that he does not use drugs.  ROS: A complete review of systems was performed.  All systems are negative except for pertinent findings as noted.  Physical Exam:  Vital signs in last 24 hours:   Constitutional:  Alert and oriented, No acute distress Cardiovascular: Regular rate and rhythm, No JVD Respiratory: Normal respiratory effort, Lungs clear bilaterally GI: Abdomen is soft, nontender, nondistended, no abdominal masses Genitourinary: No CVAT. Normal male phallus, testes are descended bilaterally and non-tender and without masses, scrotum is normal in appearance without lesions or masses, perineum is normal on inspection. Rectal: Normal sphincter tone, no rectal masses, prostate is non tender and without nodularity. Prostate size is estimated to be 50 cc Lymphatic: No lymphadenopathy Neurologic: Grossly intact, no focal deficits Psychiatric: Normal mood and affect  Laboratory Data:  No results for input(s): WBC, HGB, HCT, PLT in the last 72 hours.  No results for input(s): NA, K, CL, GLUCOSE, BUN, CALCIUM, CREATININE in the last 72 hours.  Invalid input(s): CO3   No results found for this or any previous visit (from the past 24 hour(s)). No results found for this or any previous visit (from the past 240 hour(s)).  Renal Function: No results for input(s): CREATININE in the last 168 hours. CrCl cannot be calculated (Patient's most recent lab result is older than the maximum 21 days allowed.).  Radiologic Imaging: No results found.  Impression/Assessment:  BPH with obstruction despite medical therapy  Plan:  Urolift procedure.

## 2017-11-10 ENCOUNTER — Encounter (HOSPITAL_BASED_OUTPATIENT_CLINIC_OR_DEPARTMENT_OTHER): Payer: Self-pay | Admitting: Urology

## 2017-11-10 NOTE — Anesthesia Postprocedure Evaluation (Signed)
Anesthesia Post Note  Patient: [place] Ice  Procedure(s) Performed: CYSTOSCOPY WITH INSERTION OF UROLIFT (N/A Bladder)     Patient location during evaluation: PACU Anesthesia Type: General Level of consciousness: awake and alert Pain management: pain level controlled Vital Signs Assessment: post-procedure vital signs reviewed and stable Respiratory status: spontaneous breathing, nonlabored ventilation, respiratory function stable and patient connected to nasal cannula oxygen Cardiovascular status: blood pressure returned to baseline and stable Postop Assessment: no apparent nausea or vomiting Anesthetic complications: no    Last Vitals:  Vitals:   11/09/17 1500 11/09/17 1917  BP: (!) 141/75 (!) 147/63  Pulse: 70 80  Resp: 20 16  Temp:  37.4 C  SpO2: 99% 96%    Last Pain:  Vitals:   11/09/17 1105  TempSrc: Oral                 Elion Hocker S

## 2017-12-05 DIAGNOSIS — N401 Enlarged prostate with lower urinary tract symptoms: Secondary | ICD-10-CM | POA: Diagnosis not present

## 2017-12-05 DIAGNOSIS — R972 Elevated prostate specific antigen [PSA]: Secondary | ICD-10-CM | POA: Diagnosis not present

## 2017-12-05 DIAGNOSIS — R351 Nocturia: Secondary | ICD-10-CM | POA: Diagnosis not present

## 2017-12-08 DIAGNOSIS — L720 Epidermal cyst: Secondary | ICD-10-CM | POA: Diagnosis not present

## 2017-12-08 DIAGNOSIS — B351 Tinea unguium: Secondary | ICD-10-CM | POA: Diagnosis not present

## 2017-12-08 DIAGNOSIS — L814 Other melanin hyperpigmentation: Secondary | ICD-10-CM | POA: Diagnosis not present

## 2017-12-08 DIAGNOSIS — D1801 Hemangioma of skin and subcutaneous tissue: Secondary | ICD-10-CM | POA: Diagnosis not present

## 2017-12-08 DIAGNOSIS — D225 Melanocytic nevi of trunk: Secondary | ICD-10-CM | POA: Diagnosis not present

## 2017-12-08 DIAGNOSIS — D2262 Melanocytic nevi of left upper limb, including shoulder: Secondary | ICD-10-CM | POA: Diagnosis not present

## 2017-12-08 DIAGNOSIS — D485 Neoplasm of uncertain behavior of skin: Secondary | ICD-10-CM | POA: Diagnosis not present

## 2017-12-08 DIAGNOSIS — L821 Other seborrheic keratosis: Secondary | ICD-10-CM | POA: Diagnosis not present

## 2017-12-08 DIAGNOSIS — D2271 Melanocytic nevi of right lower limb, including hip: Secondary | ICD-10-CM | POA: Diagnosis not present

## 2017-12-22 DIAGNOSIS — R972 Elevated prostate specific antigen [PSA]: Secondary | ICD-10-CM | POA: Diagnosis not present

## 2017-12-22 DIAGNOSIS — R351 Nocturia: Secondary | ICD-10-CM | POA: Diagnosis not present

## 2017-12-22 DIAGNOSIS — N401 Enlarged prostate with lower urinary tract symptoms: Secondary | ICD-10-CM | POA: Diagnosis not present

## 2018-02-08 DIAGNOSIS — H52203 Unspecified astigmatism, bilateral: Secondary | ICD-10-CM | POA: Diagnosis not present

## 2018-02-08 DIAGNOSIS — H35363 Drusen (degenerative) of macula, bilateral: Secondary | ICD-10-CM | POA: Diagnosis not present

## 2018-02-26 DIAGNOSIS — R351 Nocturia: Secondary | ICD-10-CM | POA: Diagnosis not present

## 2018-02-26 DIAGNOSIS — R972 Elevated prostate specific antigen [PSA]: Secondary | ICD-10-CM | POA: Diagnosis not present

## 2018-02-26 DIAGNOSIS — N401 Enlarged prostate with lower urinary tract symptoms: Secondary | ICD-10-CM | POA: Diagnosis not present

## 2018-03-16 ENCOUNTER — Emergency Department (HOSPITAL_COMMUNITY)
Admission: EM | Admit: 2018-03-16 | Discharge: 2018-03-16 | Disposition: A | Discharge status: Home / Self Care | Payer: Medicare HMO | Attending: Emergency Medicine | Admitting: Emergency Medicine

## 2018-03-16 ENCOUNTER — Other Ambulatory Visit: Payer: Self-pay

## 2018-03-16 ENCOUNTER — Emergency Department (HOSPITAL_COMMUNITY): Payer: Medicare HMO

## 2018-03-16 DIAGNOSIS — J449 Chronic obstructive pulmonary disease, unspecified: Secondary | ICD-10-CM | POA: Diagnosis not present

## 2018-03-16 DIAGNOSIS — F419 Anxiety disorder, unspecified: Secondary | ICD-10-CM | POA: Diagnosis not present

## 2018-03-16 DIAGNOSIS — Y998 Other external cause status: Secondary | ICD-10-CM | POA: Diagnosis not present

## 2018-03-16 DIAGNOSIS — Z7982 Long term (current) use of aspirin: Secondary | ICD-10-CM | POA: Insufficient documentation

## 2018-03-16 DIAGNOSIS — Y92017 Garden or yard in single-family (private) house as the place of occurrence of the external cause: Secondary | ICD-10-CM | POA: Diagnosis not present

## 2018-03-16 DIAGNOSIS — I10 Essential (primary) hypertension: Secondary | ICD-10-CM | POA: Diagnosis not present

## 2018-03-16 DIAGNOSIS — I2510 Atherosclerotic heart disease of native coronary artery without angina pectoris: Secondary | ICD-10-CM | POA: Diagnosis not present

## 2018-03-16 DIAGNOSIS — E039 Hypothyroidism, unspecified: Secondary | ICD-10-CM | POA: Diagnosis not present

## 2018-03-16 DIAGNOSIS — Z96612 Presence of left artificial shoulder joint: Secondary | ICD-10-CM | POA: Diagnosis not present

## 2018-03-16 DIAGNOSIS — W0110XA Fall on same level from slipping, tripping and stumbling with subsequent striking against unspecified object, initial encounter: Secondary | ICD-10-CM | POA: Diagnosis not present

## 2018-03-16 DIAGNOSIS — Z87891 Personal history of nicotine dependence: Secondary | ICD-10-CM | POA: Diagnosis not present

## 2018-03-16 DIAGNOSIS — Y9389 Activity, other specified: Secondary | ICD-10-CM | POA: Diagnosis not present

## 2018-03-16 DIAGNOSIS — S0003XA Contusion of scalp, initial encounter: Secondary | ICD-10-CM | POA: Insufficient documentation

## 2018-03-16 DIAGNOSIS — W19XXXA Unspecified fall, initial encounter: Secondary | ICD-10-CM

## 2018-03-16 DIAGNOSIS — S0083XA Contusion of other part of head, initial encounter: Secondary | ICD-10-CM | POA: Diagnosis not present

## 2018-03-16 DIAGNOSIS — Z79899 Other long term (current) drug therapy: Secondary | ICD-10-CM | POA: Insufficient documentation

## 2018-03-16 DIAGNOSIS — Z96611 Presence of right artificial shoulder joint: Secondary | ICD-10-CM | POA: Insufficient documentation

## 2018-03-16 DIAGNOSIS — R69 Illness, unspecified: Secondary | ICD-10-CM | POA: Diagnosis not present

## 2018-03-16 DIAGNOSIS — S0990XA Unspecified injury of head, initial encounter: Secondary | ICD-10-CM | POA: Diagnosis present

## 2018-03-16 NOTE — ED Provider Notes (Signed)
Patient placed in Quick Look pathway, seen and evaluated   Chief Complaint: Fall  HPI: Patient presents to ED for left-sided hematoma and pain at the site after falling prior to arrival.  He states that he was in his driveway when he tripped and fell over an object after mowing the lawn.  He denies any loss of consciousness.  He does take a baby aspirin daily.  He was able to stand up, take a shower and then come to the ED after the fall occurred.  Denies any neck pain, vision changes, vomiting, numbness in arms or legs, other blood thinner use.  ROS: head pain  Physical Exam:   Gen: No distress  Neuro: Awake and Alert  Skin: Warm    Focused Exam: large hematoma noted to L forehead.  No facial asymmetry noted.  Equal grip strength bilaterally and strength 5/5 in bilateral upper extremities.  Pupils equal and reactive.  No C, T or L-spine tenderness to palpation.   Initiation of care has begun. The patient has been counseled on the process, plan, and necessity for staying for the completion/evaluation, and the remainder of the medical screening examination.  Portions of this note were generated with [employer]. Dictation errors may occur despite best attempts at proofreading.    Delia Heady, PA-C 03/16/18 1438    Margette Fast, MD 03/17/18 [phone]

## 2018-03-16 NOTE — ED Provider Notes (Signed)
Patient tripped and fell this afternoon striking left temporal area .  He denies loss of consciousness.  Denies any pain whatsoever presently.  On exam he is alert Glasgow Coma Score 15 H ENT exam there is a golf ball sized hematoma with overlying abrasion left temple area otherwise normocephalic atraumatic.  Neck full range of motion without pain.  Nontender neurologic Glasgow Coma Score 15 motor strength 5/5 overall gait is normal cranial nerves II through XII grossly intact   Jay Dakin, MD 03/16/18 1909

## 2018-03-16 NOTE — ED Triage Notes (Signed)
Pt reports he tripped and fell in his driveway. He has hematoma to the left side of his head. He states he takes a baby aspirin per day. No LOC. Pt AOX4.

## 2018-03-16 NOTE — ED Provider Notes (Signed)
[facility] Provider Note   CSN: [id number] Arrival date & time: 03/16/18  1240     History   Chief Complaint Chief Complaint  Patient presents with  . Fall    HPI Jay Bradshaw is a 83 y.o. male with extensive past medical history who presents the emergency department today for fall.  Patient states that he was just finished mowing his lawn when he was walking backwards and caught his heel over an object, falling backwards and hitting his head.  He denies any loss of consciousness.  No nausea or vomiting since the event.  Patient takes a baby aspirin daily.  No anticoagulation use.  He has been asymptomatic since the event.  No interventions prior to arrival.  He denies any symptoms preceding the event.  Denies any neck pain, visual changes, nausea/vomiting, numbness/tingling/weakness of the arms or legs, bowel/bladder incontinence.  Tetanus up-to-date.  HPI  Past Medical History:  Diagnosis Date  . Anxiety   . Arthritis   . Balance problem    more noticed in the dark  . Complication of anesthesia    opioids causes urine retention  . Constipation    takes Colace daily as needed  . COPD (chronic obstructive pulmonary disease) ([place])   . Coronary artery disease    takes Metoprolol daily, Prox RCA lesion, 20 %stenosed, Mid RCA lesion 20% stenosed, Dist LAD lesion 20% stenosed EF greater than 65%,, 1st mrg lesion 60% stenosed, mild non-obstructive disease in LAD and RCA, Stenosis of OM Branch appears moderate  . Emphysema lung ([place])   . Enlarged prostate    takes Tamsulosin daily  . History of echocardiogram 09/08/2016   EF 36-64% grade 2 diastolic dysfunction mild RAE Reviewed  . Hyperlipidemia    takes Lovastatin daily  . Hypothyroidism    takes Synthroid daily  . Joint pain   . Osteoarthritis of left shoulder 09/29/2015  . Osteoarthritis of right shoulder, primary 12/16/2014  . PONV (postoperative nausea and vomiting)   . Productive  cough   . Seasonal allergies   . Shortness of breath dyspnea   . Tremors of nervous system    essential tremors  . Urinary frequency    takes Ditropan daily    Patient Active Problem List   Diagnosis Date Noted  . Osteoarthritis of left shoulder 09/29/2015  . Precordial pain   . Abnormal stress test   . Osteoarthritis of right shoulder, primary 12/16/2014  . S/P shoulder replacement 12/16/2014  . Preop cardiovascular exam 09/17/2014  . Coronary artery disease involving native coronary artery of native heart without angina pectoris 01/29/2014  . HTN (hypertension) 01/29/2014  . Tremor 01/29/2014    Past Surgical History:  Procedure Laterality Date  . CARDIAC CATHETERIZATION N/A 09/25/2015   Procedure: Left Heart Cath and Coronary Angiography;  Surgeon: Burnell Blanks, MD;  Location: [facility];  Service: Cardiovascular;  Laterality: N/A;  . CARDIAC CATHETERIZATION N/A 08/29/2016   Procedure: Left Heart Cath and Coronary Angiography;  Surgeon: Burnell Blanks, MD;  Location: [facility];  Service: Cardiovascular;  Laterality: N/A;  . CATARACT EXTRACTION  Aug and Sep 2011   first left eye, then right eye  . COLONOSCOPY  June 2008   Dr. Lurena Joiner ref by Ruthann Cancer  . CYSTOSCOPY WITH INSERTION OF UROLIFT N/A 11/09/2017   Procedure: CYSTOSCOPY WITH INSERTION OF UROLIFT;  Surgeon: Franchot Gallo, MD;  Location: [facility];  Service: Urology;  Laterality: N/A;  .  excision of fibroma  October 1994   in skin on back of neck right of midline, Dede Query ref by Lidia Collum  . HERNIA REPAIR    . IRRIGATION AND DEBRIDEMENT SEBACEOUS CYST  2010   from the back, [facility]  . LIPOMA EXCISION  Nov 2004   from neck, right side (near location of mandible cyst), Dr Lorrine Kin ref by [place]  . PROSTATE BIOPSY  2006   Dr. Leia Alf  . SHOULDER HEMI-ARTHROPLASTY Right 12/16/2014   Procedure: SHOULDER HEMI-ARTHROPLASTY;  Surgeon: Johnny Bridge, MD;  Location: [facility];  Service: Orthopedics;  Laterality: Right;  . SHOULDER SURGERY Right 12/16/2014   hemi-arthroplasty    dr Mardelle Matte  . thyroid adenoma removal  August 1970   right side, [facility]  . TOTAL SHOULDER ARTHROPLASTY Right 12/16/2014   Procedure: RIGHT TOTAL SHOULDER ARTHROPLASTY;  Surgeon: Johnny Bridge, MD;  Location: [facility];  Service: Orthopedics;  Laterality: Right;  . TOTAL SHOULDER ARTHROPLASTY Left 09/29/2015  . TOTAL SHOULDER ARTHROPLASTY Left 09/29/2015   Procedure: TOTAL LEFT SHOULDER ARTHROPLASTY;  Surgeon: Marchia Bond, MD;  Location: [facility];  Service: Orthopedics;  Laterality: Left;  . vacuolar cyst removal     in right mandible fixed/filled, Denyce Robert ref by Synetta Shadow, DDS  . wisdom teeth extracted          Home Medications    Prior to Admission medications   Medication Sig Start Date End Date Taking? Authorizing Provider  alendronate (FOSAMAX) 70 MG tablet Take 70 mg by mouth every Saturday. Take with a full glass of water on an empty stomach.     [provider]  aspirin 81 MG tablet Take 81 mg by mouth daily.    [provider]  beta carotene w/minerals (OCUVITE) tablet Take 1 tablet by mouth every other day.    [provider]  CALCIUM-MAGNESIUM PO Take 500-800 mg by mouth 2 (two) times daily.     [provider]  cephALEXin (KEFLEX) 500 MG capsule Take 1 capsule (500 mg total) by mouth 2 (two) times daily. 11/09/17   Franchot Gallo, MD  cholecalciferol (VITAMIN D) 400 UNITS TABS tablet Take 400 Units by mouth 2 (two) times daily.    [provider]  Coenzyme Q10 (CO Q10) 100 MG CAPS Take 200 mg by mouth daily.    [provider]  docusate sodium (COLACE) 100 MG capsule Take 100 mg by mouth daily as needed for mild constipation.    [provider]  levothyroxine (SYNTHROID, LEVOTHROID) 88 MCG tablet Take 88 mcg by mouth daily before breakfast.    [provider]  lovastatin (MEVACOR) 20 MG tablet Take 20 mg by mouth every morning.     [provider]  Melatonin 3 MG TABS Take 1 tablet by mouth at bedtime as needed (sleep).    [provider]  metoprolol succinate (TOPROL-XL) 50 MG 24 hr tablet Take 50 mg by mouth at bedtime.     [provider]  Multiple Vitamin (MULTIVITAMIN) capsule Take 1 capsule by mouth every other day.     [provider]  nitroGLYCERIN (NITROSTAT) 0.4 MG SL tablet Place 1 tablet (0.4 mg total) under the tongue every 5 (five) minutes as needed for chest pain (up to 3 doses MAX). 08/17/17   Josue Hector, MD  POTASSIUM PO Take 99 mg by mouth daily.     [provider]  PSYLLIUM HUSK PO Take 1 capsule by  mouth 3 (three) times daily as needed (depending on meals he ate that day).     [provider]  saw palmetto 160 MG capsule Take 160 mg by mouth 3 (three) times daily.    [provider]  tamsulosin (FLOMAX) 0.4 MG CAPS capsule Take 0.4 mg by mouth 2 (two) times daily.     [provider]  tolterodine (DETROL LA) 4 MG 24 hr capsule Take 4 mg by mouth every evening.    [provider]  traMADol (ULTRAM) 50 MG tablet Take 1 tablet (50 mg total) by mouth every 6 (six) hours as needed. 11/09/17   Franchot Gallo, MD  vitamin C (ASCORBIC ACID) 500 MG tablet Take 500 mg by mouth every other day.     [provider]  vitamin E (VITAMIN E) 400 UNIT capsule Take 300 Units by mouth every other day.     [provider]    Family History Family History  Problem Relation Age of Onset  . Hypertension Mother     Social History Social History   Tobacco Use  . Smoking status: Former Research scientist (life sciences)  . Smokeless tobacco: Never Used  . Tobacco comment: quit smoking 86yrs ago  Substance Use Topics  . Alcohol use: Yes    Comment: 3 drinks weekly  . Drug use: No     Allergies   Oxycodone   Review of Systems Review of Systems    All other systems reviewed and are negative.    Physical Exam Updated Vital Signs BP 135/64 (BP Location: Right Arm)   Pulse 65   Temp 98 F (36.7 C) (Oral)   Resp 16   SpO2 100%   Physical Exam  Constitutional: He appears well-developed and well-nourished.  HENT:  Head: Normocephalic and atraumatic.  Right Ear: External ear normal.  Left Ear: External ear normal.  Nose: Nose normal.  Patient with left upper frontal scalp hematoma with mild abrasion overlying this.  No palpable open or depressed skull fracture.  No CSF otorrhea.  No raccoon eyes or battle signs.  Eyes: Conjunctivae are normal. Right eye exhibits no discharge. Left eye exhibits no discharge. No scleral icterus.  Neck:  No C-spine tenderness palpation or step-offs.  No deformity noted.  Normal range of motion.  Pulmonary/Chest: Effort normal. No respiratory distress.  Musculoskeletal:  Abrasions to left wrist and forearm.  No laceration that would require repair.  No snuffbox tenderness bilaterally.  Normal range of motion without pain or crepitus.  Neurological: He is alert.  Mental Status:  Alert, oriented, thought content appropriate, able to give a coherent history. Speech fluent without evidence of aphasia. Able to follow 2 step commands without difficulty.  Cranial Nerves:  II:  Peripheral visual fields grossly normal, pupils equal, round, reactive to light III,IV, VI: ptosis not present, extra-ocular motions intact bilaterally  V,VII: smile symmetric, eyebrows raise symmetric, facial light touch sensation equal VIII: hearing grossly normal to voice  X: uvula elevates symmetrically  XI: bilateral shoulder shrug symmetric and strong XII: midline tongue extension without fassiculations Motor:  Normal tone. 5/5 in upper and lower extremities bilaterally including strong and equal grip strength and dorsiflexion/plantar flexion Sensory: Sensation intact to light touch in all extremities.  Deep Tendon  Reflexes: 2+ and symmetric in the biceps and patella Cerebellar: normal finger-to-nose with bilateral upper extremities. Normal heel-to-shin balance bilaterally of the lower extremity. No pronator drift.  Gait: normal gait and balance CV: distal pulses palpable throughout   Skin: Skin  is warm and dry. Abrasion noted. No pallor.  Psychiatric: He has a normal mood and affect.  Nursing note and vitals reviewed.    ED Treatments / Results  Labs (all labs ordered are listed, but only abnormal results are displayed) Labs Reviewed - No data to display  EKG None  Radiology Ct Head Wo Contrast  Result Date: 03/16/2018 CLINICAL DATA:  [place]. Pain. No loss of consciousness. No anticoagulants. EXAM: CT HEAD WITHOUT CONTRAST TECHNIQUE: Contiguous axial images were obtained from the base of the skull through the vertex without intravenous contrast. COMPARISON:  None. FINDINGS: Brain: No evidence for acute infarction, hemorrhage, mass lesion, hydrocephalus, or extra-axial fluid. Mild atrophy, not unexpected for age. Hypoattenuation of white matter, consistent with small vessel disease. Vascular: Calcification of the cavernous internal carotid arteries consistent with cerebrovascular atherosclerotic disease. No signs of intracranial large vessel occlusion. Skull: Calvarium intact.  LEFT frontal scalp hematoma. Sinuses/Orbits: BILATERAL cataract extraction. No significant sinus fluid. Other: None. IMPRESSION: Age related changes.  No acute intracranial findings. No skull fracture or intracranial hemorrhage. LEFT frontal scalp hematoma. Electronically Signed   By: Staci Righter M.D.   On: 03/16/2018 16:11    Procedures Procedures (including critical care time)  Medications Ordered in ED Medications - No data to display   Initial Impression / Assessment and Plan / ED Course  I have reviewed the triage vital signs and the nursing notes.  Pertinent labs & imaging results that were available during my  care of the patient were reviewed by me and considered in my medical decision making (see chart for details).     83 year old male presenting for mechanical fall with head injury.  Patient denies loss of consciousness.  He is on daily aspirin.  No anticoagulation use.  He has been asymptomatic since the event.  His tetanus is up-to-date.  CT scan done in triage is without evidence of acute intracranial injury.  No skull fracture or intracranial hemorrhage.  Patient's neuro exam without any focal neurologic deficits.  Musculoskeletal exam otherwise reassuring.  No neck pain.  No C-spine tenderness or step-offs.  No concern for cervical spine injury at this time.  Advised ice for scalp hematoma. I advised the patient to follow-up with PCP this week. Specific return precautions discussed. Time was given for all questions to be answered. The patient verbalized understanding and agreement with plan. The patient appears safe for discharge home.  Final Clinical Impressions(s) / ED Diagnoses   Final diagnoses:  Fall, initial encounter  Hematoma of scalp, initial encounter    ED Discharge Orders    None       Lorelle Gibbs 03/16/18 Drema Halon    Orlie Dakin, MD 03/17/18 [phone]

## 2018-03-16 NOTE — Discharge Instructions (Addendum)
Your CT scan was reassuring. This did not show any skull fractures or internal injury.  You are found to have a hematoma on your left upper scalp.  Please follow attached handouts. Apply ice to the area for 20 minutes for the next 3 days to help decrease swelling.   HOW TO MAKE AN ICE PACK  To make an ice pack, do one of the following:  Place crushed ice or a bag of frozen vegetables in a sealable plastic bag. Squeeze out the excess air. Place this bag inside another plastic bag. Slide the bag into a pillowcase or place a damp towel between your skin and the bag.  Mix 3 parts water with 1 part rubbing alcohol. Freeze the mixture in a sealable plastic bag. When you remove the mixture from the freezer, it will be slushy. Squeeze out the excess air. Place this bag inside another plastic bag. Slide the bag into a pillowcase or place a damp towel between your skin and the ice pack.   Follow up with your primary care provider for further evaluation this week. If you develop worsening or new concerning symptoms you can return to the emergency department for re-evaluation.

## 2018-03-16 NOTE — ED Notes (Signed)
Signature pad not working, pt verbalized understanding of discharge instructions 

## 2018-05-28 IMAGING — CT CT HEAD W/O CM
4 series · 16 of 47 positions shown, 18 images · non-contrast
Comparison: None.

CLINICAL DATA: Fell. Pain. No loss of consciousness. No
anticoagulants.

EXAM:
CT HEAD WITHOUT CONTRAST
TECHNIQUE: Contiguous axial images were obtained from the base of the skull
through the vertex without intravenous contrast.

[Series 3: head wo · axial · 0.44mm/px · z∈[-56,+58]mm · 7 of 31 slices shown, 9 images]
[im 4/31  brain]
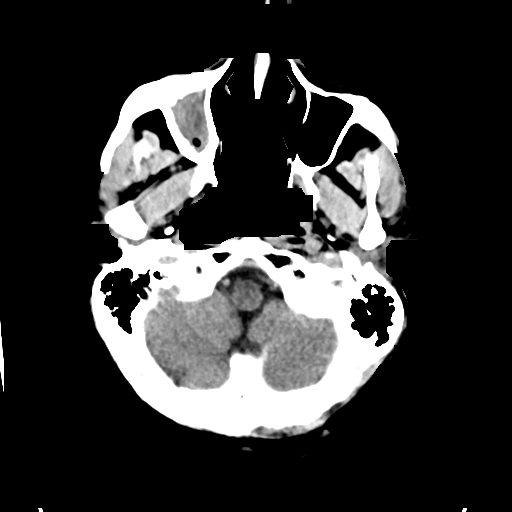
[im 4/31  bone]
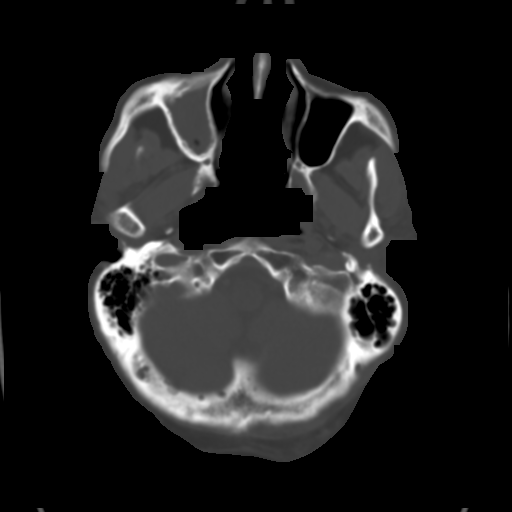
[im 8/31  brain]
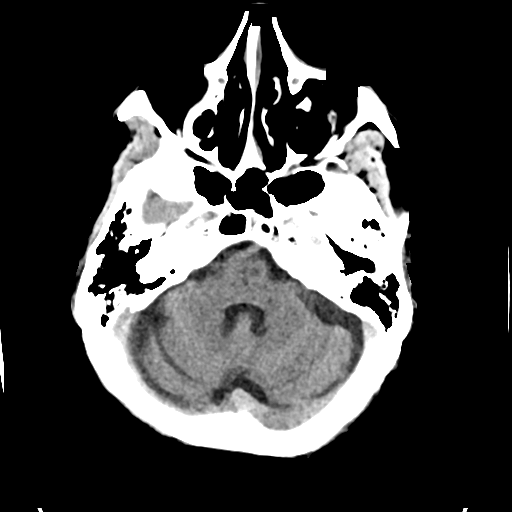
[im 12/31  brain]
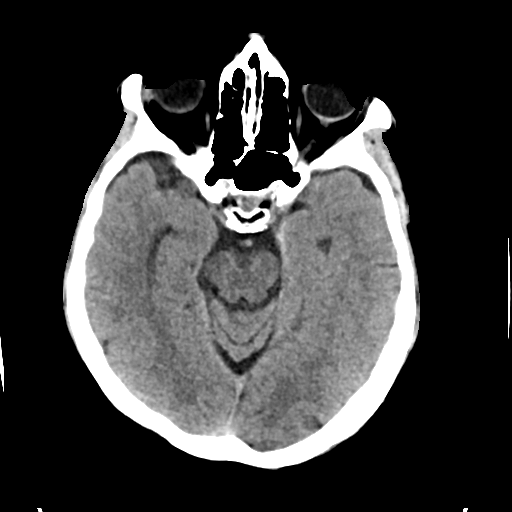
[im 16/31  brain]
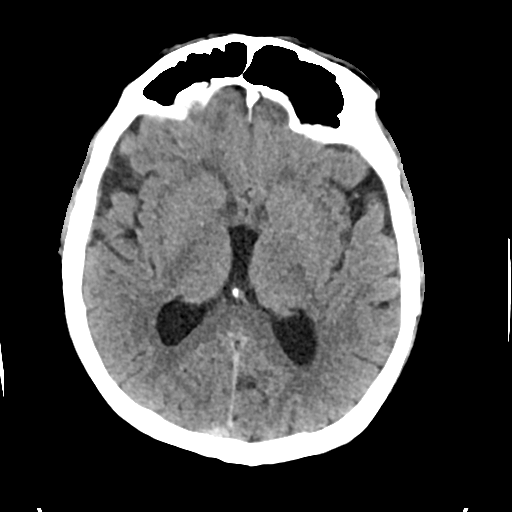
[im 19/31  brain]
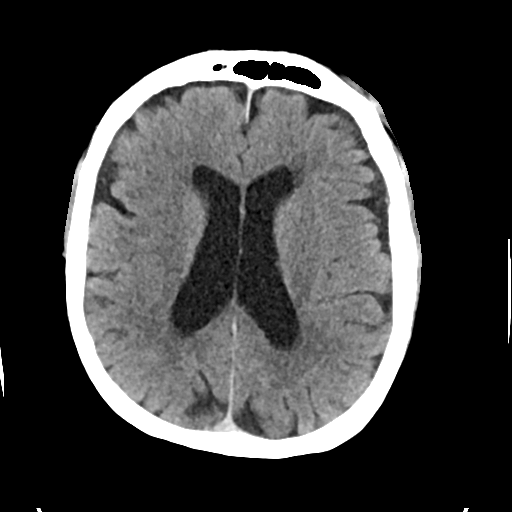
[im 19/31  bone]
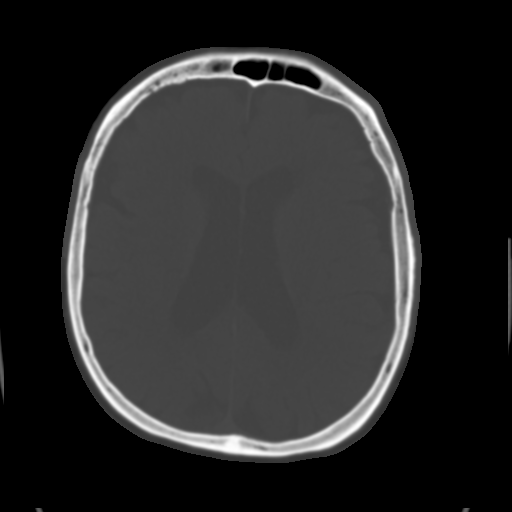
[im 23/31  brain]
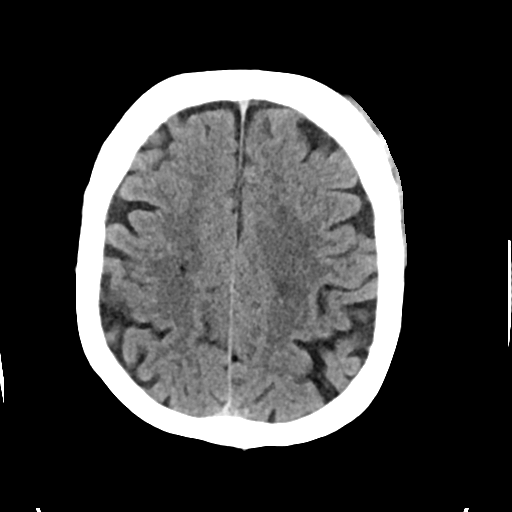
[im 27/31  brain]
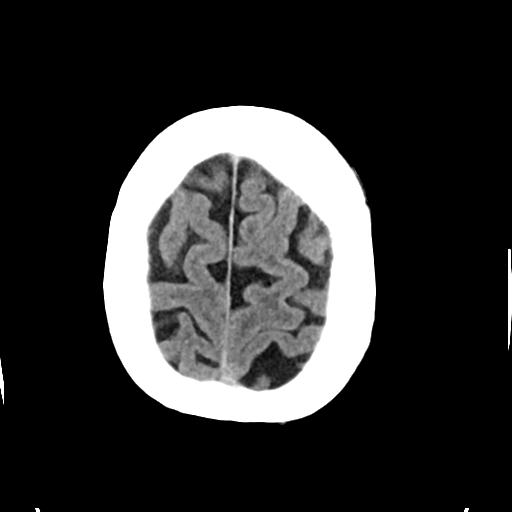

[Series 4: head bone · axial · 0.44mm/px · z∈[-58,-26]mm · 3 of 78 slices shown]
[im 8/78  bone]
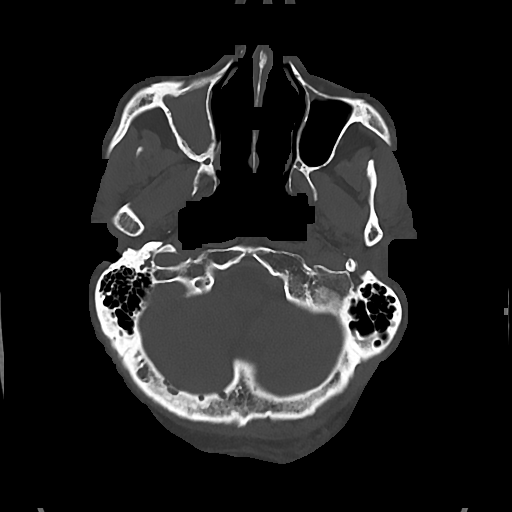
[im 16/78  bone]
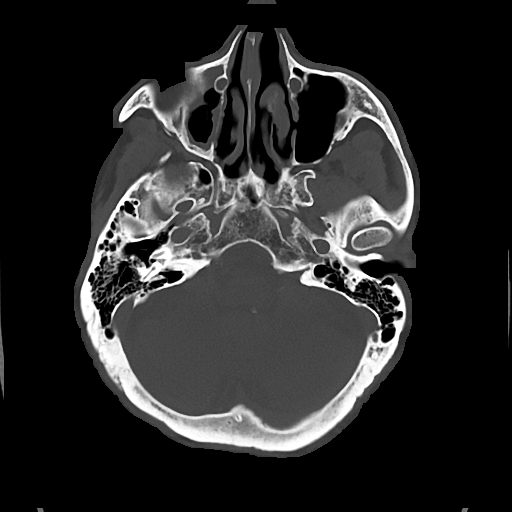
[im 24/78  bone]
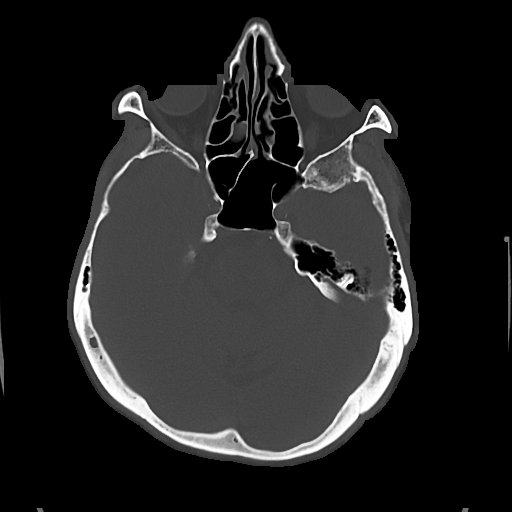

[Series 5: cor soft · coronal · 0.33mm/px · 3 of 77 slices shown]
[im 26/77  brain]
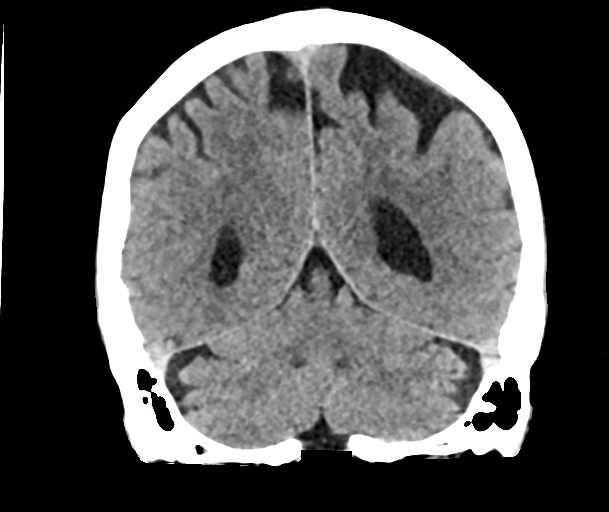
[im 34/77  brain]
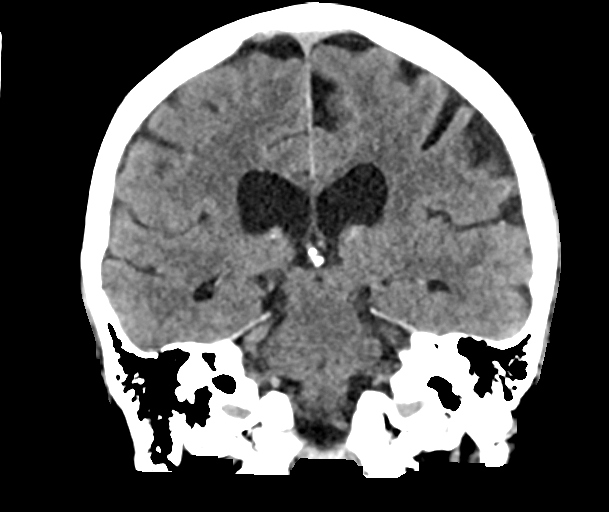
[im 43/77  brain]
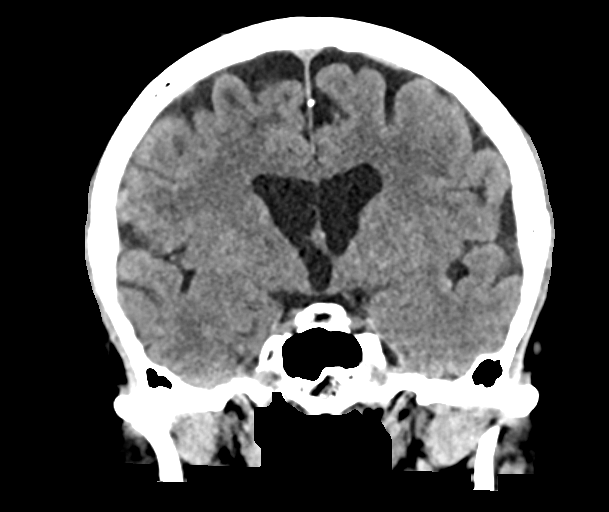

[Series 6: sag soft · sagittal · 0.32mm/px · 3 of 63 slices shown]
[im 21/63  brain]
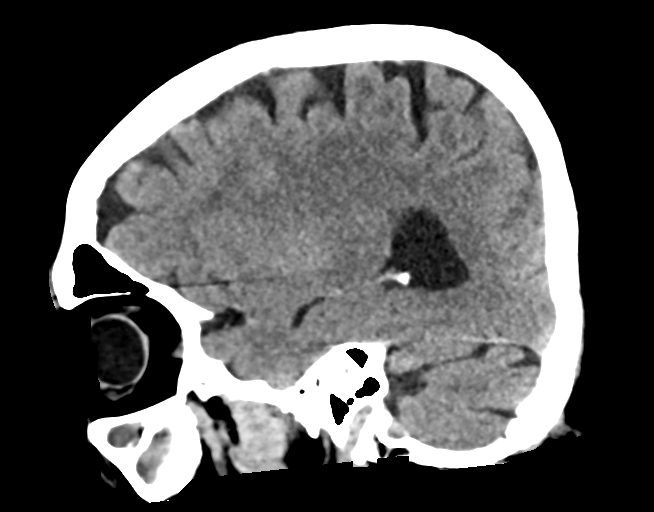
[im 32/63  brain]
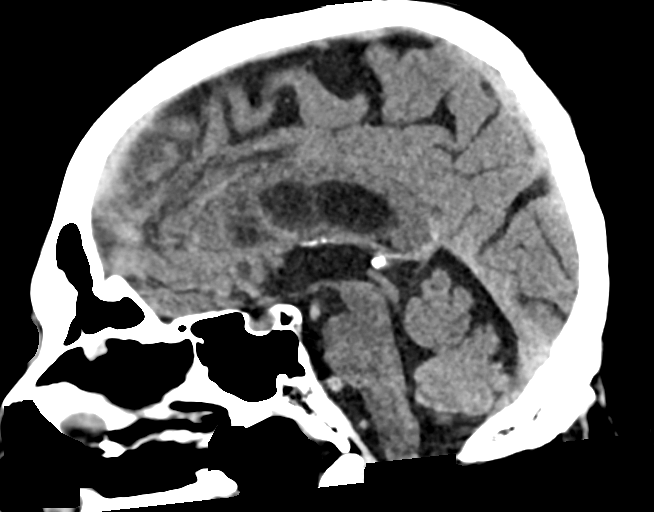
[im 42/63  brain]
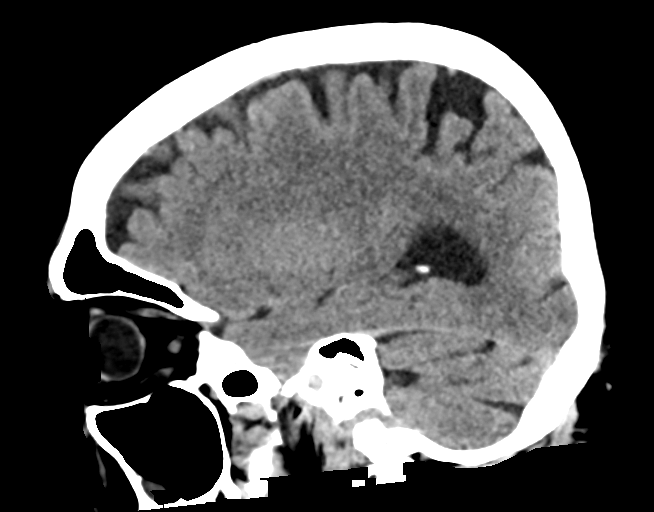

[16 of 47 positions shown; findings below may reference images not displayed]

FINDINGS: Brain: No evidence for acute infarction, hemorrhage, mass lesion,
hydrocephalus, or extra-axial fluid. Mild atrophy, not unexpected
for age. Hypoattenuation of white matter, consistent with small
vessel disease.

Vascular: Calcification of the cavernous internal carotid arteries
consistent with cerebrovascular atherosclerotic disease. No signs of
intracranial large vessel occlusion.

Skull: Calvarium intact.  LEFT frontal scalp hematoma.

Sinuses/Orbits: BILATERAL cataract extraction. No significant sinus
fluid.

Other: None.
IMPRESSION: Age related changes.  No acute intracranial findings.

No skull fracture or intracranial hemorrhage.

LEFT frontal scalp hematoma.

## 2018-07-24 DIAGNOSIS — B370 Candidal stomatitis: Secondary | ICD-10-CM | POA: Diagnosis not present

## 2018-07-24 DIAGNOSIS — K146 Glossodynia: Secondary | ICD-10-CM | POA: Diagnosis not present

## 2018-07-26 DIAGNOSIS — B370 Candidal stomatitis: Secondary | ICD-10-CM | POA: Diagnosis not present

## 2018-08-09 ENCOUNTER — Encounter: Payer: Self-pay | Admitting: Cardiovascular Disease

## 2018-08-14 NOTE — Progress Notes (Signed)
Cardiology Office Note    Date:  08/22/2018   ID:  Jay Bradshaw, DOB February 01, 1935, MRN [id number]  PCP:  Velna Hatchet, MD  Cardiologist:  Dr. Johnsie Cancel  CC: follow up   History of Present Illness:  Jay Bradshaw is a 83 y.o. male with a history of non obstructive CAD, emphysema, essential tremor, hypothyroidism who presents to clinic for cardiology follow up.  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 He moved to [place] from [place]. He established care with me  in 08/2015. He had reported having a cath in 2008 with no interventions.  LHC on 09/25/15 showed 20% prox RCA, 20% mRCA, 20% dLAD, 60% 1st Marg with moderate eccentric stenosis that did not appear to be flow limiting. Per cath report, PCI would be straight forward to do if he had chest pain c/w angina moving forward. Subsequently had uncomplicated left shoulder surgery Dr Mardelle Matte 10/02/2015   Seen by PA 08/2016 with chest pain Cath set up with Dr Angelena Form  Reviewed films: moderate disease in small OM with negative  FFR medical Rx   Some stress from wifes failing health No chest pain. Needs new nitro. Daughter lives in [place] And has 4 yo twins   Conclusion     Prox RCA lesion, 20 %stenosed.  Mid RCA lesion, 20 %stenosed.  Dist LAD lesion, 20 %stenosed.  The left ventricular systolic function is normal.  LV end diastolic pressure is normal.  The left ventricular ejection fraction is greater than 65% by visual estimate.  1st Mrg lesion, 60 %stenosed.   1. Mild non-obstructive disease in the LAD and RCA.  2. The obtuse marginal branch is a small to moderate caliber vessel (~1.75 mm). The stenosis in the OM branch appears moderate. FFR suggests the lesion is not flow limiting.  3. Normal LV systolic function  Recommendations: Continue medical management of CAD. I suspect that his chest pain is not cardiac related. Consider addition of Imdur if chest pain continues.       Echo: 09/08/16 EF 62-22% grade 2  diastolic dysfunction mild RAE Reviewed  Still with some constriction in chest Mostly at night before he goes to bed Not with activity sometimes associated with eating food too fast   Past Medical History:  Diagnosis Date  . Anxiety   . Arthritis   . Balance problem    more noticed in the dark  . Complication of anesthesia    opioids causes urine retention  . Constipation    takes Colace daily as needed  . COPD (chronic obstructive pulmonary disease) ([place])   . Coronary artery disease    takes Metoprolol daily, Prox RCA lesion, 20 %stenosed, Mid RCA lesion 20% stenosed, Dist LAD lesion 20% stenosed EF greater than 65%,, 1st mrg lesion 60% stenosed, mild non-obstructive disease in LAD and RCA, Stenosis of OM Branch appears moderate  . Emphysema lung ([place])   . Enlarged prostate    takes Tamsulosin daily  . History of echocardiogram 09/08/2016   EF 97-98% grade 2 diastolic dysfunction mild RAE Reviewed  . Hyperlipidemia    takes Lovastatin daily  . Hypothyroidism    takes Synthroid daily  . Joint pain   . Osteoarthritis of left shoulder 09/29/2015  . Osteoarthritis of right shoulder, primary 12/16/2014  . PONV (postoperative nausea and vomiting)   . Productive cough   . Seasonal allergies   . Shortness of breath dyspnea   . Tremors of nervous system    essential tremors  .  Urinary frequency    takes Ditropan daily    Past Surgical History:  Procedure Laterality Date  . CARDIAC CATHETERIZATION N/A 09/25/2015   Procedure: Left Heart Cath and Coronary Angiography;  Surgeon: Burnell Blanks, MD;  Location: [facility];  Service: Cardiovascular;  Laterality: N/A;  . CARDIAC CATHETERIZATION N/A 08/29/2016   Procedure: Left Heart Cath and Coronary Angiography;  Surgeon: Burnell Blanks, MD;  Location: [facility];  Service: Cardiovascular;  Laterality: N/A;  . CATARACT EXTRACTION  Aug and Sep 2011   first left eye, then right eye  . COLONOSCOPY  June  2008   Dr. Lurena Joiner ref by Ruthann Cancer  . CYSTOSCOPY WITH INSERTION OF UROLIFT N/A 11/09/2017   Procedure: CYSTOSCOPY WITH INSERTION OF UROLIFT;  Surgeon: Franchot Gallo, MD;  Location[facility];  Service: Urology;  Laterality: N/A;  . excision of fibroma  October 1994   in skin on back of neck right of midline, Dede Query ref by Lidia Collum  . HERNIA REPAIR    . IRRIGATION AND DEBRIDEMENT SEBACEOUS CYST  2010   from the back, [facility]  . LIPOMA EXCISION  Nov 2004   from neck, right side (near location of mandible cyst), Dr Lorrine Kin ref by [place]  . PROSTATE BIOPSY  2006   Dr. Leia Alf  . SHOULDER HEMI-ARTHROPLASTY Right 12/16/2014   Procedure: SHOULDER HEMI-ARTHROPLASTY;  Surgeon: Johnny Bridge, MD;  Location: [facility];  Service: Orthopedics;  Laterality: Right;  . SHOULDER SURGERY Right 12/16/2014   hemi-arthroplasty    dr Mardelle Matte  . thyroid adenoma removal  August 1970   right side, [facility]  . TOTAL SHOULDER ARTHROPLASTY Right 12/16/2014   Procedure: RIGHT TOTAL SHOULDER ARTHROPLASTY;  Surgeon: Johnny Bridge, MD;  Location: [facility];  Service: Orthopedics;  Laterality: Right;  . TOTAL SHOULDER ARTHROPLASTY Left 09/29/2015  . TOTAL SHOULDER ARTHROPLASTY Left 09/29/2015   Procedure: TOTAL LEFT SHOULDER ARTHROPLASTY;  Surgeon: Marchia Bond, MD;  Location: [facility];  Service: Orthopedics;  Laterality: Left;  . vacuolar cyst removal     in right mandible fixed/filled, Denyce Robert ref by Synetta Shadow, DDS  . wisdom teeth extracted      Current Medications: Outpatient Medications Prior to Visit  Medication Sig Dispense Refill  . alendronate (FOSAMAX) 70 MG tablet Take 70 mg by mouth every Saturday. Take with a full glass of water on an empty stomach.     Marland Kitchen aspirin 81 MG tablet Take 81 mg by mouth daily.    . beta carotene w/minerals (OCUVITE) tablet Take 1 tablet by mouth every other day.    Marland Kitchen CALCIUM-MAGNESIUM PO Take 500-800 mg by mouth 2  (two) times daily.     . cephALEXin (KEFLEX) 500 MG capsule Take 1 capsule (500 mg total) by mouth 2 (two) times daily. 6 capsule 0  . cholecalciferol (VITAMIN D) 400 UNITS TABS tablet Take 400 Units by mouth 2 (two) times daily.    . Coenzyme Q10 (CO Q10) 100 MG CAPS Take 200 mg by mouth daily.    Marland Kitchen docusate sodium (COLACE) 100 MG capsule Take 100 mg by mouth daily as needed for mild constipation.    Marland Kitchen levothyroxine (SYNTHROID, LEVOTHROID) 88 MCG tablet Take 88 mcg by mouth daily before breakfast.    . lovastatin (MEVACOR) 20 MG tablet Take 20 mg by mouth every morning.     . Melatonin 3 MG TABS Take 1 tablet by mouth at bedtime as needed (  sleep).    . metoprolol succinate (TOPROL-XL) 50 MG 24 hr tablet Take 50 mg by mouth at bedtime.     . Multiple Vitamin (MULTIVITAMIN) capsule Take 1 capsule by mouth every other day.     . nitroGLYCERIN (NITROSTAT) 0.4 MG SL tablet Place 1 tablet (0.4 mg total) under the tongue every 5 (five) minutes as needed for chest pain (up to 3 doses MAX). 25 tablet 3  . POTASSIUM PO Take 99 mg by mouth daily.     . PSYLLIUM HUSK PO Take 1 capsule by mouth 3 (three) times daily as needed (depending on meals he ate that day).     . saw palmetto 160 MG capsule Take 160 mg by mouth 3 (three) times daily.    . tamsulosin (FLOMAX) 0.4 MG CAPS capsule Take 0.4 mg by mouth 2 (two) times daily.     Marland Kitchen tolterodine (DETROL LA) 4 MG 24 hr capsule Take 4 mg by mouth every evening.    . traMADol (ULTRAM) 50 MG tablet Take 1 tablet (50 mg total) by mouth every 6 (six) hours as needed. 15 tablet 0  . vitamin C (ASCORBIC ACID) 500 MG tablet Take 500 mg by mouth every other day.     . vitamin E (VITAMIN E) 400 UNIT capsule Take 300 Units by mouth every other day.      No facility-administered medications prior to visit.      Allergies:   Oxycodone   Social History   Socioeconomic History  . Marital status: Married    Spouse name: Not on file  . Number of children: Not on file    . Years of education: Not on file  . Highest education level: Not on file  Occupational History  . Not on file  Social Needs  . Financial resource strain: Not on file  . Food insecurity:    Worry: Not on file    Inability: Not on file  . Transportation needs:    Medical: Not on file    Non-medical: Not on file  Tobacco Use  . Smoking status: Former Research scientist (life sciences)  . Smokeless tobacco: Never Used  . Tobacco comment: quit smoking 49yrs ago  Substance and Sexual Activity  . Alcohol use: Yes    Comment: 3 drinks weekly  . Drug use: No  . Sexual activity: Never  Lifestyle  . Physical activity:    Days per week: Not on file    Minutes per session: Not on file  . Stress: Not on file  Relationships  . Social connections:    Talks on phone: Not on file    Gets together: Not on file    Attends religious service: Not on file    Active member of club or organization: Not on file    Attends meetings of clubs or organizations: Not on file    Relationship status: Not on file  Other Topics Concern  . Not on file  Social History Narrative  . Not on file     Family History:  The patient's family history includes Hypertension in his mother.     ROS:   Please see the history of present illness.    ROS All other systems reviewed and are negative.   PHYSICAL EXAM:   VS:  BP (!) 126/56   Pulse 66   Ht 6\' 1"  (1.854 m)   Wt 178 lb 4 oz (80.9 kg)   SpO2 94%   BMI 23.52 kg/m    Affect  appropriate Healthy:  appears stated age 84: normal Neck supple with no adenopathy JVP normal no bruits no thyromegaly Lungs clear with no wheezing and good diaphragmatic motion Heart:  S1/S2 no murmur, no rub, gallop or click PMI normal Abdomen: benighn, BS positve, no tenderness, no AAA no bruit.  No HSM or HJR Distal pulses intact with no bruits No edema Neuro non-focal Skin warm and dry No muscular weakness   Wt Readings from Last 3 Encounters:  08/22/18 178 lb 4 oz (80.9 kg)  11/09/17  178 lb 11.2 oz (81.1 kg)  08/17/17 183 lb (83 kg)      Studies/Labs Reviewed:   EKG:  08/26/16 sinus bradycardia HR 54 08/17/17 SR rate 55 normal  08/22/18  SR rate 65 normal   Recent Labs: 11/09/2017: BUN 20; Creatinine, Ser 0.90; Hemoglobin 13.6; Potassium 4.4; Sodium 143   Lipid Panel No results found for: CHOL, TRIG, HDL, CHOLHDL, VLDL, LDLCALC, LDLDIRECT  Additional studies/ records that were reviewed today include:  Myoview 09/21/16 Study Highlights   The left ventricular ejection fraction is normal (55-65%).  Nuclear stress EF: 65%.  ST segment depression was noted during stress in the II, III, aVF, V5 and V6 leads.  This is a low risk study.   Low risk stress nuclear study with ECG changes; small, moderate intensity, reversible inferior basal defect consistent with mild inferior ischemia; EF 65 with normal wall motion.      Cardiac catheterization 08/29/16  Conclusion    Prox RCA lesion, 20% stenosed.  Mid RCA lesion, 20% stenosed.  Dist LAD lesion, 20% stenosed.  The left ventricular systolic function is normal.  1st Mrg lesion, 60% stenosed. The vessel is small to moderate in size. The stenosis is eccentric and does not appear to be flow limiting.   Recommendations: He has a moderate eccentric stenosis in the first OM branch. This is a small to medium sized vessel. The lesion does not appear to be flow limiting. With upcoming surgery, we have discussed medical management of his CAD for now. He would prefer to attempt medical management for now. Will proceed with surgery next week. If he has chest pain c/w angina going forward, could bring him back for re-look cath and PCI of the OM which would be technically easy to do.      ASSESSMENT & PLAN:   CAD: LHC on 08/29/16 stable small distal OM disease medical Rx new nitro called in   Hypothyroidism: continue synthroid    HLD: continue statin   Essential tremor: continue BB fairly prominent at this point  consider referral to neurology for primidone or alternative Rx per Dr Allie Bossier Did give him Dr Doristine Devoid name as a referral if he decides to seek evaluation   LE edema: now resolved. Echo with normal RV/LV function     F/U with me 6  months   Jenkins Rouge

## 2018-08-22 ENCOUNTER — Ambulatory Visit: Payer: Medicare HMO | Admitting: Cardiovascular Disease

## 2018-08-22 ENCOUNTER — Encounter: Payer: Self-pay | Admitting: Cardiovascular Disease

## 2018-08-22 VITALS — BP 126/56 | HR 66 | Ht 73.0 in | Wt 178.2 lb

## 2018-08-22 DIAGNOSIS — I2510 Atherosclerotic heart disease of native coronary artery without angina pectoris: Secondary | ICD-10-CM

## 2018-08-22 DIAGNOSIS — R600 Localized edema: Secondary | ICD-10-CM

## 2018-08-22 DIAGNOSIS — E782 Mixed hyperlipidemia: Secondary | ICD-10-CM | POA: Diagnosis not present

## 2018-08-22 NOTE — Patient Instructions (Addendum)

## 2018-09-04 DIAGNOSIS — Z23 Encounter for immunization: Secondary | ICD-10-CM | POA: Diagnosis not present

## 2018-10-02 DIAGNOSIS — M859 Disorder of bone density and structure, unspecified: Secondary | ICD-10-CM | POA: Diagnosis not present

## 2018-10-02 DIAGNOSIS — Z125 Encounter for screening for malignant neoplasm of prostate: Secondary | ICD-10-CM | POA: Diagnosis not present

## 2018-10-02 DIAGNOSIS — E890 Postprocedural hypothyroidism: Secondary | ICD-10-CM | POA: Diagnosis not present

## 2018-10-02 DIAGNOSIS — R82998 Other abnormal findings in urine: Secondary | ICD-10-CM | POA: Diagnosis not present

## 2018-10-02 DIAGNOSIS — E7849 Other hyperlipidemia: Secondary | ICD-10-CM | POA: Diagnosis not present

## 2018-10-09 DIAGNOSIS — E890 Postprocedural hypothyroidism: Secondary | ICD-10-CM | POA: Diagnosis not present

## 2018-10-09 DIAGNOSIS — K146 Glossodynia: Secondary | ICD-10-CM | POA: Diagnosis not present

## 2018-10-09 DIAGNOSIS — M810 Age-related osteoporosis without current pathological fracture: Secondary | ICD-10-CM | POA: Diagnosis not present

## 2018-10-09 DIAGNOSIS — R69 Illness, unspecified: Secondary | ICD-10-CM | POA: Diagnosis not present

## 2018-10-09 DIAGNOSIS — R251 Tremor, unspecified: Secondary | ICD-10-CM | POA: Diagnosis not present

## 2018-10-09 DIAGNOSIS — R972 Elevated prostate specific antigen [PSA]: Secondary | ICD-10-CM | POA: Diagnosis not present

## 2018-10-09 DIAGNOSIS — Z0000 Encounter for general adult medical examination without abnormal findings: Secondary | ICD-10-CM | POA: Diagnosis not present

## 2018-10-09 DIAGNOSIS — N401 Enlarged prostate with lower urinary tract symptoms: Secondary | ICD-10-CM | POA: Diagnosis not present

## 2018-10-09 DIAGNOSIS — M859 Disorder of bone density and structure, unspecified: Secondary | ICD-10-CM | POA: Diagnosis not present

## 2018-10-09 DIAGNOSIS — E7849 Other hyperlipidemia: Secondary | ICD-10-CM | POA: Diagnosis not present

## 2018-10-11 ENCOUNTER — Encounter: Payer: Self-pay | Admitting: Neurology

## 2018-10-12 DIAGNOSIS — Z1212 Encounter for screening for malignant neoplasm of rectum: Secondary | ICD-10-CM | POA: Diagnosis not present

## 2018-10-26 DIAGNOSIS — S41119A Laceration without foreign body of unspecified upper arm, initial encounter: Secondary | ICD-10-CM | POA: Diagnosis not present

## 2018-10-26 DIAGNOSIS — G250 Essential tremor: Secondary | ICD-10-CM | POA: Diagnosis not present

## 2018-10-26 DIAGNOSIS — S0181XA Laceration without foreign body of other part of head, initial encounter: Secondary | ICD-10-CM | POA: Diagnosis not present

## 2018-10-30 NOTE — Progress Notes (Signed)
Subjective:   Jay Bradshaw was seen in consultation in the movement disorder clinic at the request of Velna Hatchet, MD.  The evaluation is for tremor.  Tremor started in childhood and involves the the bilateral UE, R more than L.  He is R hand dominant.  Tremor is most noticeable when tired.   There is a family hx of tremor in his brothers (2 of them), daughter, dad and nieces.    Affected by caffeine:  Unknown (drinks quart of coffee per day) Affected by alcohol:  Yes.   (drinks 1 drink 3 times per week) Affected by stress:  May or may not Affected by fatigue:  Yes.   Spills soup if on spoon:  No. Spills glass of liquid if full:  No. Affects ADL's (tying shoes, brushing teeth, etc):  No.  Current/Previously tried tremor medications: On metoprolol, 50 mg daily  Current medications that may exacerbate tremor:  n/a  Outside reports reviewed: historical medical records, lab reports and office notes.  CT of the brain was performed after a fall in April, 2019.  There was evidence of small vessel disease and a left frontal scalp hematoma due to the fall.  Allergies  Allergen Reactions  . Oxycodone Other (See Comments)    Difficulty urinating for 24 plus hours    Outpatient Encounter Medications as of 11/01/2018  Medication Sig  . acetaminophen (TYLENOL) 325 MG tablet Take 650 mg by mouth every 6 (six) hours as needed.  Marland Kitchen alendronate (FOSAMAX) 70 MG tablet Take 70 mg by mouth every Saturday. Take with a full glass of water on an empty stomach.   Marland Kitchen aspirin 81 MG tablet Take 81 mg by mouth daily.  . beta carotene w/minerals (OCUVITE) tablet Take 1 tablet by mouth every other day.  . bisacodyl (DULCOLAX) 5 MG EC tablet Take 5 mg by mouth daily as needed for moderate constipation.  Marland Kitchen CALCIUM-MAGNESIUM PO Take 500-800 mg by mouth 2 (two) times daily.   . cholecalciferol (VITAMIN D) 400 UNITS TABS tablet Take 400 Units by mouth 2 (two) times daily.  . Coenzyme Q10 (CO Q10) 100 MG CAPS Take  200 mg by mouth daily.  Marland Kitchen levothyroxine (SYNTHROID, LEVOTHROID) 88 MCG tablet Take 88 mcg by mouth daily before breakfast.  . lovastatin (MEVACOR) 20 MG tablet Take 20 mg by mouth every morning.   . Melatonin 3 MG TABS Take 1 tablet by mouth at bedtime as needed (sleep).  . metoprolol succinate (TOPROL-XL) 50 MG 24 hr tablet Take 50 mg by mouth at bedtime.   . Multiple Vitamin (MULTIVITAMIN) capsule Take 1 capsule by mouth every other day.   Marland Kitchen POTASSIUM PO Take 99 mg by mouth daily.   . PSYLLIUM HUSK PO Take 1 capsule by mouth 3 (three) times daily as needed (depending on meals he ate that day).   . saw palmetto 160 MG capsule Take 160 mg by mouth 3 (three) times daily.  . tamsulosin (FLOMAX) 0.4 MG CAPS capsule Take 0.4 mg by mouth 2 (two) times daily.   . vitamin C (ASCORBIC ACID) 500 MG tablet Take 500 mg by mouth every other day.   . vitamin E (VITAMIN E) 400 UNIT capsule Take 300 Units by mouth every other day.   . nitroGLYCERIN (NITROSTAT) 0.4 MG SL tablet Place 1 tablet (0.4 mg total) under the tongue every 5 (five) minutes as needed for chest pain (up to 3 doses MAX). (Patient not taking: Reported on 11/01/2018)  . [DISCONTINUED] cephALEXin (  KEFLEX) 500 MG capsule Take 1 capsule (500 mg total) by mouth 2 (two) times daily.  . [DISCONTINUED] docusate sodium (COLACE) 100 MG capsule Take 100 mg by mouth daily as needed for mild constipation.  . [DISCONTINUED] tolterodine (DETROL LA) 4 MG 24 hr capsule Take 4 mg by mouth every evening.  . [DISCONTINUED] traMADol (ULTRAM) 50 MG tablet Take 1 tablet (50 mg total) by mouth every 6 (six) hours as needed.   No facility-administered encounter medications on file as of 11/01/2018.     Past Medical History:  Diagnosis Date  . Anxiety   . Arthritis   . Balance problem    more noticed in the dark  . Complication of anesthesia    opioids causes urine retention  . Constipation    takes Colace daily as needed  . COPD (chronic obstructive  pulmonary disease) ([place])   . Coronary artery disease    takes Metoprolol daily, Prox RCA lesion, 20 %stenosed, Mid RCA lesion 20% stenosed, Dist LAD lesion 20% stenosed EF greater than 65%,, 1st mrg lesion 60% stenosed, mild non-obstructive disease in LAD and RCA, Stenosis of OM Branch appears moderate  . Emphysema lung ([place])   . Enlarged prostate    takes Tamsulosin daily  . History of echocardiogram 09/08/2016   EF 34-74% grade 2 diastolic dysfunction mild RAE Reviewed  . Hyperlipidemia    takes Lovastatin daily  . Hypothyroidism    takes Synthroid daily  . Joint pain   . Osteoarthritis of left shoulder 09/29/2015  . Osteoarthritis of right shoulder, primary 12/16/2014  . PONV (postoperative nausea and vomiting)   . Productive cough   . Seasonal allergies   . Shortness of breath dyspnea   . Tremors of nervous system    essential tremors  . Urinary frequency    takes Ditropan daily    Past Surgical History:  Procedure Laterality Date  . CARDIAC CATHETERIZATION N/A 09/25/2015   Procedure: Left Heart Cath and Coronary Angiography;  Surgeon: Burnell Blanks, MD;  Location: [facility];  Service: Cardiovascular;  Laterality: N/A;  . CARDIAC CATHETERIZATION N/A 08/29/2016   Procedure: Left Heart Cath and Coronary Angiography;  Surgeon: Burnell Blanks, MD;  Location: [facility];  Service: Cardiovascular;  Laterality: N/A;  . CATARACT EXTRACTION  Aug and Sep 2011   first left eye, then right eye  . COLONOSCOPY  June 2008   Dr. Lurena Joiner ref by Ruthann Cancer  . CYSTOSCOPY WITH INSERTION OF UROLIFT N/A 11/09/2017   Procedure: CYSTOSCOPY WITH INSERTION OF UROLIFT;  Surgeon: Franchot Gallo, MD;  Location: [facility];  Service: Urology;  Laterality: N/A;  . excision of fibroma  October 1994   in skin on back of neck right of midline, Dede Query ref by Lidia Collum  . HERNIA REPAIR    . IRRIGATION AND DEBRIDEMENT SEBACEOUS CYST  2010   from the  back, [facility]  . LIPOMA EXCISION  Nov 2004   from neck, right side (near location of mandible cyst), Dr Lorrine Kin ref by [place]  . PROSTATE BIOPSY  2006   Dr. Leia Alf  . SHOULDER HEMI-ARTHROPLASTY Right 12/16/2014   Procedure: SHOULDER HEMI-ARTHROPLASTY;  Surgeon: Johnny Bridge, MD;  Location: [place];  Service: Orthopedics;  Laterality: Right;  . SHOULDER SURGERY Right 12/16/2014   hemi-arthroplasty    dr Mardelle Matte  . thyroid adenoma removal  August 1970   right side, [facility]  . TOTAL SHOULDER ARTHROPLASTY Right  12/16/2014   Procedure: RIGHT TOTAL SHOULDER ARTHROPLASTY;  Surgeon: Johnny Bridge, MD;  Location: [place];  Service: Orthopedics;  Laterality: Right;  . TOTAL SHOULDER ARTHROPLASTY Left 09/29/2015  . TOTAL SHOULDER ARTHROPLASTY Left 09/29/2015   Procedure: TOTAL LEFT SHOULDER ARTHROPLASTY;  Surgeon: Marchia Bond, MD;  Location: [place];  Service: Orthopedics;  Laterality: Left;  . vacuolar cyst removal     in right mandible fixed/filled, Denyce Robert ref by Synetta Shadow, DDS  . wisdom teeth extracted      Social History   Socioeconomic History  . Marital status: Married    Spouse name: Not on file  . Number of children: Not on file  . Years of education: Not on file  . Highest education level: Not on file  Occupational History  . Not on file  Social Needs  . Financial resource strain: Not on file  . Food insecurity:    Worry: Not on file    Inability: Not on file  . Transportation needs:    Medical: Not on file    Non-medical: Not on file  Tobacco Use  . Smoking status: Former Smoker    Last attempt to quit: 11/02/1971    Years since quitting: 47.0  . Smokeless tobacco: Never Used  Substance and Sexual Activity  . Alcohol use: Yes    Comment: 3 drinks weekly  . Drug use: No  . Sexual activity: Never  Lifestyle  . Physical activity:    Days per week: Not on file    Minutes per session: Not on file  . Stress: Not on file    Relationships  . Social connections:    Talks on phone: Not on file    Gets together: Not on file    Attends religious service: Not on file    Active member of club or organization: Not on file    Attends meetings of clubs or organizations: Not on file    Relationship status: Not on file  . Intimate partner violence:    Fear of current or ex partner: Not on file    Emotionally abused: Not on file    Physically abused: Not on file    Forced sexual activity: Not on file  Other Topics Concern  . Not on file  Social History Narrative  . Not on file    Family Status  Relation Name Status  . Mother  Deceased       Artial Sclorosis  . Father  Deceased  . MGM  Deceased  . MGF  Deceased  . PGM  Deceased  . PGF  Deceased  . Brother  Alive  . Daughter  Alive    Review of Systems Review of Systems  Constitutional: Negative.   HENT: Negative.   Eyes: Negative.   Respiratory: Negative.   Gastrointestinal: Positive for diarrhea.  Genitourinary: Positive for urgency.  Musculoskeletal:       Balance trouble - "my wife and I live a life of cluttered items" - had 2 falls this year.  One time mowing a lawn and walking backward and tripped on driveway.  The one last week, was in unpaved parking lot and fell in gravel and hit top of head  Skin: Negative.   Neurological: Positive for tremors.  Endo/Heme/Allergies: Negative.   Psychiatric/Behavioral: Negative.      Objective:   VITALS:   Vitals:   11/01/18 0938  BP: 136/72  Pulse: 60  SpO2: 96%  Weight: 183 lb (83 kg)  Height:  6\' 1"  (1.854 m)   Gen:  Appears stated age and in NAD. HEENT:  Normocephalic.  Has scab over forehead/top of scalp. The mucous membranes are moist. The superficial temporal arteries are without ropiness or tenderness. Cardiovascular: Regular rate and rhythm. Lungs: Clear to auscultation bilaterally. Neck: There are no carotid bruits noted bilaterally.  NEUROLOGICAL:  Orientation:  The patient is  alert and oriented x 3.  Recent and remote memory are intact.  Does have some trouble staying focused.  Able to name objects and repeat without trouble.  Fund of knowledge is appropriate Cranial nerves: There is good facial symmetry. The pupils are equal round and reactive to light bilaterally. Fundoscopic exam is attempted but the disc margins are not well visualized bilaterally. Extraocular muscles are intact and visual fields are full to confrontational testing. Speech is fluent and clear. Soft palate rises symmetrically and there is no tongue deviation. Hearing is intact to conversational tone. Tone: Tone is good throughout. Sensation: Sensation is intact to light touch and pinprick throughout (facial, trunk, extremities). Vibration is decreased at the bilateral big toe and ankle.  It is decreased at the knee.  He has trouble sensing when the vibration goes away. There is no extinction with double simultaneous stimulation. There is no sensory dermatomal level identified. Coordination:  The patient has no dysdiadichokinesia or dysmetria. Motor: Strength is 5/5 in the bilateral upper and lower extremities.  Shoulder shrug is equal bilaterally.  There is no pronator drift.  There are no fasciculations noted. DTR's: Deep tendon reflexes are 2/4 at the bilateral biceps, triceps, brachioradialis, patella and trace at the bilateral achilles.  Plantar responses are downgoing bilaterally. Gait and Station: The patient is able to ambulate without difficulty. The patient has slight trouble ambulating in a tandem fashion.  He is able to stand in the Romberg position.  MOVEMENT EXAM: Tremor:  There is tremor the outstretched hands.  It only slightly increased with intention.  When given a weight, it does not significantly change.  Tremor is evident with Archimedes spirals, but it is only mild.  He is able to pour water from one glass to another without spilling it, but tremor is evident when he goes to pick up a  glass.  Lab work was completed on October 02, 2018.  White blood cells were 6.3, hemoglobin 13.4, platelets 246.  Sodium was 140, potassium 4.7, chloride 104, CO2 30, BUN 20, creatinine 1.1, AST 26, ALT 21, alkaline phosphatase 63, TSH 0.62.     Assessment/Plan:   1.  Essential Tremor.  -This is evidenced by the symmetrical nature and longstanding hx of gradually getting worse.  We discussed nature and pathophysiology.  We discussed that this can continue to gradually get worse with time.  We discussed that some medications can worsen this, as can caffeine use.  We discussed medication therapy as well as surgical therapy.  Ultimately, the patient decided to get information on primidone (which he did) and to think about it.  2.  Peripheral neuropathy  -The patient has clinical examination evidence of a diffuse peripheral neuropathy, which certainly can affect gait and balance.  We discussed safety associated with peripheral neuropathy.  We discussed balance therapy and the importance of ambulatory assistive device for balance assistance.  -We will do labs to r/o reversible causes of peripheral neuropathy.  He has already had some lab work done.  We added B12, folate, RPR, SPEP/UPEP with immunofixation, hemoglobin A1c.  3.  Trouble focusing  -asks about adult  ADHD and strattera.  Told him that this was out of my field of expertise.  Did tell him that ADD meds would make tremor more troublesome.  Talked about where he could get ADD testing done if he was interested.  Not sure that these medications would be beneficial at his age group.  Would certainly defer to someone with more expertise.  4.  Follow-up with patient if he would like on an as-needed basis, or if he decides he would like medication for tremor.  Much greater than 50% of this visit was spent in counseling and coordinating care.  Total face to face time:  60 min  CC:  Velna Hatchet, MD

## 2018-11-01 ENCOUNTER — Other Ambulatory Visit (INDEPENDENT_AMBULATORY_CARE_PROVIDER_SITE_OTHER): Payer: Medicare HMO

## 2018-11-01 ENCOUNTER — Encounter: Payer: Self-pay | Admitting: Neurology

## 2018-11-01 ENCOUNTER — Ambulatory Visit: Payer: Medicare HMO | Admitting: Neurology

## 2018-11-01 VITALS — BP 136/72 | HR 60 | Ht 73.0 in | Wt 183.0 lb

## 2018-11-01 DIAGNOSIS — R739 Hyperglycemia, unspecified: Secondary | ICD-10-CM | POA: Diagnosis not present

## 2018-11-01 DIAGNOSIS — G250 Essential tremor: Secondary | ICD-10-CM

## 2018-11-01 DIAGNOSIS — G609 Hereditary and idiopathic neuropathy, unspecified: Secondary | ICD-10-CM

## 2018-11-01 NOTE — Patient Instructions (Addendum)
1.  You can consider whether or not you want to take medication.   The medication we discussed is called primidone  2.  I have given you a brochure to [facility].  This is just FYI as you were wondering the psychologists who did testing for ADD.  I have circled the profiles of the psychologists who I believe do this testing.  3. Your provider has requested that you have labwork completed today. Please go to [facility] ([phone]) on the second floor of this building before leaving the office today. You do not need to check in. If you are not called within 15 minutes please check with the front desk.

## 2018-11-02 DIAGNOSIS — N401 Enlarged prostate with lower urinary tract symptoms: Secondary | ICD-10-CM | POA: Diagnosis not present

## 2018-11-02 DIAGNOSIS — R350 Frequency of micturition: Secondary | ICD-10-CM | POA: Diagnosis not present

## 2018-11-06 ENCOUNTER — Telehealth: Payer: Self-pay | Admitting: Neurology

## 2018-11-06 LAB — PROTEIN ELECTROPHORESIS, SERUM
ALBUMIN ELP: 3.8 g/dL (ref 3.8–4.8)
Alpha 1: 0.4 g/dL — ABNORMAL HIGH (ref 0.2–0.3)
Alpha 2: 0.8 g/dL (ref 0.5–0.9)
BETA GLOBULIN: 0.4 g/dL (ref 0.4–0.6)
Beta 2: 0.5 g/dL (ref 0.2–0.5)
Gamma Globulin: 1.3 g/dL (ref 0.8–1.7)
TOTAL PROTEIN: 7.2 g/dL (ref 6.1–8.1)

## 2018-11-06 LAB — HEMOGLOBIN A1C
Hgb A1c MFr Bld: 5.8 % of total Hgb — ABNORMAL HIGH (ref <5.7)
Mean Plasma Glucose: 120 (calc)
eAG (mmol/L): 6.6 (calc)

## 2018-11-06 LAB — IMMUNOFIXATION ELECTROPHORESIS
IgG (Immunoglobin G), Serum: 1435 mg/dL (ref 600–1540)
IgM, Serum: 83 mg/dL (ref 50–300)
Immunofix Electr Int: NOT DETECTED
Immunoglobulin A: 340 mg/dL — ABNORMAL HIGH (ref 20–320)

## 2018-11-06 LAB — PROTEIN ELECTROPHORESIS,RANDOM URN
ALPHA-1-GLOBULIN, U: 5 %
ALPHA-2-GLOBULIN, U: 23 %
Albumin: 28 %
BETA GLOBULIN, U: 22 %
CREATININE, URINE: 110 mg/dL (ref 20–320)
GAMMA GLOBULIN, U: 21 %
PROTEIN/CREATININE RATIO: 0.091 mg/mg{creat} (ref 0.022–0.12)
Protein/Creat Ratio: 91 mg/g creat (ref 22–128)
Total Protein, Urine: 10 mg/dL (ref 5–25)

## 2018-11-06 LAB — FOLATE: FOLATE: 19.6 ng/mL

## 2018-11-06 LAB — RPR: RPR Ser Ql: NONREACTIVE

## 2018-11-06 LAB — IMMUNOFIXATION INTE

## 2018-11-06 LAB — VITAMIN B12: VITAMIN B 12: 743 pg/mL (ref 200–1100)

## 2018-11-06 NOTE — Telephone Encounter (Signed)
-----   Message from Person, DO sent at 11/06/2018 11:53 AM EST ----- Let pt know that labs were okay

## 2018-11-06 NOTE — Telephone Encounter (Signed)
Patient made aware of results. He asked for lab results to be mailed to him and this has been done.

## 2018-11-08 DIAGNOSIS — Z4802 Encounter for removal of sutures: Secondary | ICD-10-CM | POA: Diagnosis not present

## 2018-11-08 DIAGNOSIS — S0191XA Laceration without foreign body of unspecified part of head, initial encounter: Secondary | ICD-10-CM | POA: Diagnosis not present

## 2018-11-08 DIAGNOSIS — Z6824 Body mass index (BMI) 24.0-24.9, adult: Secondary | ICD-10-CM | POA: Diagnosis not present

## 2018-11-14 ENCOUNTER — Telehealth: Payer: Self-pay | Admitting: Neurology

## 2018-11-14 NOTE — Telephone Encounter (Signed)
Yes, note and labs were sent to PCP.

## 2018-11-14 NOTE — Telephone Encounter (Signed)
Patient called and wanted to make sure that his labs and notes have been sent to his PCP Dr. Velna Hatchet. Thanks

## 2018-11-16 DIAGNOSIS — D1801 Hemangioma of skin and subcutaneous tissue: Secondary | ICD-10-CM | POA: Diagnosis not present

## 2018-11-16 DIAGNOSIS — L814 Other melanin hyperpigmentation: Secondary | ICD-10-CM | POA: Diagnosis not present

## 2018-11-16 DIAGNOSIS — D225 Melanocytic nevi of trunk: Secondary | ICD-10-CM | POA: Diagnosis not present

## 2018-11-16 DIAGNOSIS — L821 Other seborrheic keratosis: Secondary | ICD-10-CM | POA: Diagnosis not present

## 2018-11-16 DIAGNOSIS — D485 Neoplasm of uncertain behavior of skin: Secondary | ICD-10-CM | POA: Diagnosis not present

## 2018-11-16 DIAGNOSIS — L570 Actinic keratosis: Secondary | ICD-10-CM | POA: Diagnosis not present

## 2019-04-29 DIAGNOSIS — R198 Other specified symptoms and signs involving the digestive system and abdomen: Secondary | ICD-10-CM | POA: Diagnosis not present

## 2019-04-29 DIAGNOSIS — G609 Hereditary and idiopathic neuropathy, unspecified: Secondary | ICD-10-CM | POA: Diagnosis not present

## 2019-04-29 DIAGNOSIS — I2510 Atherosclerotic heart disease of native coronary artery without angina pectoris: Secondary | ICD-10-CM | POA: Diagnosis not present

## 2019-04-29 DIAGNOSIS — R251 Tremor, unspecified: Secondary | ICD-10-CM | POA: Diagnosis not present

## 2019-04-29 DIAGNOSIS — R69 Illness, unspecified: Secondary | ICD-10-CM | POA: Diagnosis not present

## 2019-07-05 DIAGNOSIS — H52203 Unspecified astigmatism, bilateral: Secondary | ICD-10-CM | POA: Diagnosis not present

## 2019-07-05 DIAGNOSIS — H35363 Drusen (degenerative) of macula, bilateral: Secondary | ICD-10-CM | POA: Diagnosis not present

## 2019-07-31 DIAGNOSIS — R69 Illness, unspecified: Secondary | ICD-10-CM | POA: Diagnosis not present

## 2019-09-19 DIAGNOSIS — N390 Urinary tract infection, site not specified: Secondary | ICD-10-CM | POA: Diagnosis not present

## 2019-09-19 DIAGNOSIS — A499 Bacterial infection, unspecified: Secondary | ICD-10-CM | POA: Diagnosis not present

## 2019-09-19 DIAGNOSIS — R309 Painful micturition, unspecified: Secondary | ICD-10-CM | POA: Diagnosis not present

## 2019-10-08 NOTE — Progress Notes (Signed)
Cardiology Office Note    Date:  10/10/2019   ID:  Jay Bradshaw, DOB [birth year], MRN [id number]  PCP:  Jay Hatchet, MD  Cardiologist:  Dr. Johnsie Cancel  CC: follow up   History of Present Illness:  Jay Bradshaw is a 84 y.o. male with a history of non obstructive CAD, emphysema, essential tremor, hypothyroidism who presents to clinic for cardiology follow up.  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 He moved to [place] from [place]. He established care with me  in 08/2015. He had reported having a cath in 2008 with no interventions.  LHC on 09/25/15 showed 20% prox RCA, 20% mRCA, 20% dLAD, 60% 1st Marg with moderate eccentric stenosis that did not appear to be flow limiting. Per cath report, PCI would be straight forward to do if he had chest pain c/w angina moving forward. Subsequently had uncomplicated left shoulder surgery Dr Mardelle Matte 10/02/2015   Seen by PA 08/2016 with chest pain Cath set up with Dr Angelena Form  Reviewed films: moderate disease in small OM with negative  FFR medical Rx   Some stress from wifes failing health No chest pain. Needs new nitro. Daughter lives in [place] And has 71 yo twins  Echo: 09/08/16 EF 123456 grade 2 diastolic dysfunction mild RAE Reviewed  No real cardiac complaints Wife stays home most of time has bad knees He has some neuropathy in his feet which affects his balance   Past Medical History:  Diagnosis Date  . Anxiety   . Arthritis   . Balance problem    more noticed in the dark  . Complication of anesthesia    opioids causes urine retention  . Constipation    takes Colace daily as needed  . COPD (chronic obstructive pulmonary disease) ([place])   . Coronary artery disease    takes Metoprolol daily, Prox RCA lesion, 20 %stenosed, Mid RCA lesion 20% stenosed, Dist LAD lesion 20% stenosed EF greater than 65%,, 1st mrg lesion 60% stenosed, mild non-obstructive disease in LAD and RCA, Stenosis of OM Branch appears moderate  . Emphysema  lung ([place])   . Enlarged prostate    takes Tamsulosin daily  . History of echocardiogram 09/08/2016   EF 123456 grade 2 diastolic dysfunction mild RAE Reviewed  . Hyperlipidemia    takes Lovastatin daily  . Hypothyroidism    takes Synthroid daily  . Joint pain   . Osteoarthritis of left shoulder 09/29/2015  . Osteoarthritis of right shoulder, primary 12/16/2014  . PONV (postoperative nausea and vomiting)   . Productive cough   . Seasonal allergies   . Shortness of breath dyspnea   . Tremors of nervous system    essential tremors  . Urinary frequency    takes Ditropan daily    Past Surgical History:  Procedure Laterality Date  . CARDIAC CATHETERIZATION N/A 09/25/2015   Procedure: Left Heart Cath and Coronary Angiography;  Surgeon: Burnell Blanks, MD;  Location: [facility];  Service: Cardiovascular;  Laterality: N/A;  . CARDIAC CATHETERIZATION N/A 08/29/2016   Procedure: Left Heart Cath and Coronary Angiography;  Surgeon: Burnell Blanks, MD;  Location: [facility];  Service: Cardiovascular;  Laterality: N/A;  . CATARACT EXTRACTION  Aug and Sep 2011   first left eye, then right eye  . COLONOSCOPY  June 2008   Dr. Lurena Joiner ref by Ruthann Cancer  . CYSTOSCOPY WITH INSERTION OF UROLIFT N/A 11/09/2017   Procedure: CYSTOSCOPY WITH INSERTION OF UROLIFT;  Surgeon: Diona Fanti,  Annie Main, MD;  Location: [facility];  Service: Urology;  Laterality: N/A;  . excision of fibroma  October 1994   in skin on back of neck right of midline, Dede Query ref by Lidia Collum  . HERNIA REPAIR    . IRRIGATION AND DEBRIDEMENT SEBACEOUS CYST  2010   from the back, [facility]  . LIPOMA EXCISION  Nov 2004   from neck, right side (near location of mandible cyst), Dr Lorrine Kin ref by [place]  . PROSTATE BIOPSY  2006   Dr. Leia Alf  . SHOULDER HEMI-ARTHROPLASTY Right 12/16/2014   Procedure: SHOULDER HEMI-ARTHROPLASTY;  Surgeon: Johnny Bridge, MD;  Location: [facility];  Service: Orthopedics;  Laterality: Right;  . SHOULDER SURGERY Right 12/16/2014   hemi-arthroplasty    dr Mardelle Matte  . thyroid adenoma removal  August 1970   right side, [facility]  . TOTAL SHOULDER ARTHROPLASTY Right 12/16/2014   Procedure: RIGHT TOTAL SHOULDER ARTHROPLASTY;  Surgeon: Johnny Bridge, MD;  Location: [facility];  Service: Orthopedics;  Laterality: Right;  . TOTAL SHOULDER ARTHROPLASTY Left 09/29/2015  . TOTAL SHOULDER ARTHROPLASTY Left 09/29/2015   Procedure: TOTAL LEFT SHOULDER ARTHROPLASTY;  Surgeon: Marchia Bond, MD;  Location: [facility];  Service: Orthopedics;  Laterality: Left;  . vacuolar cyst removal     in right mandible fixed/filled, Denyce Robert ref by Synetta Shadow, DDS  . wisdom teeth extracted      Current Medications: Outpatient Medications Prior to Visit  Medication Sig Dispense Refill  . acetaminophen (TYLENOL) 325 MG tablet Take 650 mg by mouth every 6 (six) hours as needed.    Marland Kitchen alendronate (FOSAMAX) 70 MG tablet Take 70 mg by mouth every Saturday. Take with a full glass of water on an empty stomach.     Marland Kitchen aspirin 81 MG tablet Take 81 mg by mouth daily.    . beta carotene w/minerals (OCUVITE) tablet Take 1 tablet by mouth every other day.    . bisacodyl (DULCOLAX) 5 MG EC tablet Take 5 mg by mouth daily as needed for moderate constipation.    Marland Kitchen CALCIUM-MAGNESIUM PO Take 500-800 mg by mouth 2 (two) times daily.     . cholecalciferol (VITAMIN D) 400 UNITS TABS tablet Take 400 Units by mouth 2 (two) times daily.    . Coenzyme Q10 (CO Q10) 100 MG CAPS Take 200 mg by mouth daily.    Marland Kitchen levothyroxine (SYNTHROID, LEVOTHROID) 88 MCG tablet Take 88 mcg by mouth daily before breakfast.    . lovastatin (MEVACOR) 20 MG tablet Take 20 mg by mouth every morning.     . Melatonin 3 MG TABS Take 1 tablet by mouth at bedtime as needed (sleep).    . metoprolol succinate (TOPROL-XL) 50 MG 24 hr tablet Take 50 mg by mouth at bedtime.     . Multiple Vitamin  (MULTIVITAMIN) capsule Take 1 capsule by mouth every other day.     . nitroGLYCERIN (NITROSTAT) 0.4 MG SL tablet Place 1 tablet (0.4 mg total) under the tongue every 5 (five) minutes as needed for chest pain (up to 3 doses MAX). 25 tablet 3  . POTASSIUM PO Take 99 mg by mouth daily.     . PSYLLIUM HUSK PO Take 1 capsule by mouth 3 (three) times daily as needed (depending on meals he ate that day).     . saw palmetto 160 MG capsule Take 160 mg by mouth 3 (three) times daily.    . tamsulosin (FLOMAX) 0.4 MG  CAPS capsule Take 0.4 mg by mouth 2 (two) times daily.     . vitamin C (ASCORBIC ACID) 500 MG tablet Take 500 mg by mouth every other day.     . vitamin E (VITAMIN E) 400 UNIT capsule Take 300 Units by mouth every other day.      No facility-administered medications prior to visit.      Allergies:   Oxycodone   Social History   Socioeconomic History  . Marital status: Married    Spouse name: Not on file  . Number of children: Not on file  . Years of education: Not on file  . Highest education level: Not on file  Occupational History  . Occupation: retired    Comment: Adult nurse [employer]  Social Needs  . Financial resource strain: Not on file  . Food insecurity    Worry: Not on file    Inability: Not on file  . Transportation needs    Medical: Not on file    Non-medical: Not on file  Tobacco Use  . Smoking status: Former Smoker    Quit date: 11/02/1971    Years since quitting: 47.9  . Smokeless tobacco: Never Used  Substance and Sexual Activity  . Alcohol use: Yes    Comment: 3 drinks weekly  . Drug use: No  . Sexual activity: Never  Lifestyle  . Physical activity    Days per week: Not on file    Minutes per session: Not on file  . Stress: Not on file  Relationships  . Social Herbalist on phone: Not on file    Gets together: Not on file    Attends religious service: Not on file    Active member of club or organization: Not on file     Attends meetings of clubs or organizations: Not on file    Relationship status: Not on file  Other Topics Concern  . Not on file  Social History Narrative  . Not on file     Family History:  The patient's family history includes Breast cancer in his mother; Hypertension in his mother; Prostate cancer in his brother; Tremor in his brother, daughter, and father.     ROS:   Please see the history of present illness.    ROS All other systems reviewed and are negative.   PHYSICAL EXAM:   VS:  BP 118/62   Pulse (!) 54   Ht 6\' 1"  (1.854 m)   Wt 188 lb (85.3 kg)   SpO2 98%   BMI 24.80 kg/m    Affect appropriate Healthy:  appears stated age 18: normal Neck supple with no adenopathy JVP normal no bruits no thyromegaly Lungs clear with no wheezing and good diaphragmatic motion Heart:  S1/S2 no murmur, no rub, gallop or click PMI normal Abdomen: benighn, BS positve, no tenderness, no AAA no bruit.  No HSM or HJR Distal pulses intact with no bruits Trace LE  edema Neuro non-focal essential UE tremors  Skin warm and dry No muscular weakness   Wt Readings from Last 3 Encounters:  10/10/19 188 lb (85.3 kg)  11/01/18 183 lb (83 kg)  08/22/18 178 lb 4 oz (80.9 kg)      Studies/Labs Reviewed:   EKG:  08/26/16 sinus bradycardia HR 54 08/17/17 SR rate 55 normal  08/22/18  SR rate 65 normal 10/10/19 SR rate 54 normal   Recent Labs: No results found for requested labs within last 8760 hours.  Lipid Panel No results found for: CHOL, TRIG, HDL, CHOLHDL, VLDL, LDLCALC, LDLDIRECT  Additional studies/ records that were reviewed today include:  Myoview 09/21/16 Study Highlights   The left ventricular ejection fraction is normal (55-65%).  Nuclear stress EF: 65%.  ST segment depression was noted during stress in the II, III, aVF, V5 and V6 leads.  This is a low risk study.   Low risk stress nuclear study with ECG changes; small, moderate intensity, reversible inferior basal  defect consistent with mild inferior ischemia; EF 65 with normal wall motion.      Cardiac catheterization 08/29/16  Conclusion    Prox RCA lesion, 20% stenosed.  Mid RCA lesion, 20% stenosed.  Dist LAD lesion, 20% stenosed.  The left ventricular systolic function is normal.  1st Mrg lesion, 60% stenosed. The vessel is small to moderate in size. The stenosis is eccentric and does not appear to be flow limiting.   Recommendations: He has a moderate eccentric stenosis in the first OM branch. This is a small to medium sized vessel. The lesion does not appear to be flow limiting. With upcoming surgery, we have discussed medical management of his CAD for now. He would prefer to attempt medical management for now. Will proceed with surgery next week. If he has chest pain c/w angina going forward, could bring him back for re-look cath and PCI of the OM which would be technically easy to do.      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 Echo with normal RV/LV function     F/U with me in a year   Jenkins Rouge

## 2019-10-09 DIAGNOSIS — M859 Disorder of bone density and structure, unspecified: Secondary | ICD-10-CM | POA: Diagnosis not present

## 2019-10-09 DIAGNOSIS — M8589 Other specified disorders of bone density and structure, multiple sites: Secondary | ICD-10-CM | POA: Diagnosis not present

## 2019-10-09 DIAGNOSIS — E7849 Other hyperlipidemia: Secondary | ICD-10-CM | POA: Diagnosis not present

## 2019-10-09 DIAGNOSIS — Z125 Encounter for screening for malignant neoplasm of prostate: Secondary | ICD-10-CM | POA: Diagnosis not present

## 2019-10-10 ENCOUNTER — Encounter: Payer: Self-pay | Admitting: Cardiovascular Disease

## 2019-10-10 ENCOUNTER — Other Ambulatory Visit: Payer: Self-pay

## 2019-10-10 ENCOUNTER — Ambulatory Visit: Payer: Medicare HMO | Admitting: Cardiovascular Disease

## 2019-10-10 VITALS — BP 118/62 | HR 54 | Ht 73.0 in | Wt 188.0 lb

## 2019-10-10 DIAGNOSIS — I2510 Atherosclerotic heart disease of native coronary artery without angina pectoris: Secondary | ICD-10-CM

## 2019-10-10 NOTE — Patient Instructions (Signed)
Medication Instructions:  *If you need a refill on your cardiac medications before your next appointment, please call your pharmacy*  Lab Work: If you have labs (blood work) drawn today and your tests are completely normal, you will receive your results only by: . MyChart Message (if you have MyChart) OR . A paper copy in the mail If you have any lab test that is abnormal or we need to change your treatment, we will call you to review the results.  Testing/Procedures: None ordered today.  Follow-Up: At [facility], you and your health needs are our priority.  As part of our continuing mission to provide you with exceptional heart care, we have created designated Provider Care Teams.  These Care Teams include your primary Cardiologist (physician) and Advanced Practice Providers (APPs -  Physician Assistants and Nurse Practitioners) who all work together to provide you with the care you need, when you need it.  Your next appointment:   12 month(s)  The format for your next appointment:   In Person  Provider:   You may see Dr. Nishan or one of the following Advanced Practice Providers on your designated Care Team:    Lori Gerhardt, NP  Laura Ingold, NP  Jill McDaniel, NP   

## 2019-10-15 DIAGNOSIS — R82998 Other abnormal findings in urine: Secondary | ICD-10-CM | POA: Diagnosis not present

## 2019-10-16 DIAGNOSIS — R69 Illness, unspecified: Secondary | ICD-10-CM | POA: Diagnosis not present

## 2019-10-16 DIAGNOSIS — G609 Hereditary and idiopathic neuropathy, unspecified: Secondary | ICD-10-CM | POA: Diagnosis not present

## 2019-10-16 DIAGNOSIS — N401 Enlarged prostate with lower urinary tract symptoms: Secondary | ICD-10-CM | POA: Diagnosis not present

## 2019-10-16 DIAGNOSIS — E890 Postprocedural hypothyroidism: Secondary | ICD-10-CM | POA: Diagnosis not present

## 2019-10-16 DIAGNOSIS — K146 Glossodynia: Secondary | ICD-10-CM | POA: Diagnosis not present

## 2019-10-16 DIAGNOSIS — I2510 Atherosclerotic heart disease of native coronary artery without angina pectoris: Secondary | ICD-10-CM | POA: Diagnosis not present

## 2019-10-16 DIAGNOSIS — Z0000 Encounter for general adult medical examination without abnormal findings: Secondary | ICD-10-CM | POA: Diagnosis not present

## 2019-10-16 DIAGNOSIS — E785 Hyperlipidemia, unspecified: Secondary | ICD-10-CM | POA: Diagnosis not present

## 2019-10-16 DIAGNOSIS — M810 Age-related osteoporosis without current pathological fracture: Secondary | ICD-10-CM | POA: Diagnosis not present

## 2019-10-16 DIAGNOSIS — J984 Other disorders of lung: Secondary | ICD-10-CM | POA: Diagnosis not present

## 2019-12-04 DIAGNOSIS — N3000 Acute cystitis without hematuria: Secondary | ICD-10-CM | POA: Diagnosis not present

## 2019-12-04 DIAGNOSIS — R350 Frequency of micturition: Secondary | ICD-10-CM | POA: Diagnosis not present

## 2019-12-04 DIAGNOSIS — N401 Enlarged prostate with lower urinary tract symptoms: Secondary | ICD-10-CM | POA: Diagnosis not present

## 2020-05-04 ENCOUNTER — Telehealth: Payer: Self-pay

## 2020-05-04 NOTE — Telephone Encounter (Signed)
   [facility] Pre-operative Risk Assessment    HEARTCARE STAFF: - Please ensure there is not already an duplicate clearance open for this procedure. - Under Visit Info/Reason for Call, type in Other and utilize the format Clearance MM/DD/YY or Clearance TBD. Do not use dashes or single digits. - If request is for dental extraction, please clarify the # of teeth to be extracted.  Request for surgical clearance:  1. What type of surgery is being performed? RIGHT TOTAL KNEE REPLACEMENT    2. When is this surgery scheduled? 09-01-20   3. What type of clearance is required (medical clearance vs. Pharmacy clearance to hold med vs. Both)? BOTH  4. Are there any medications that need to be held prior to surgery and how long? ASA PLEASE ADVISE  5. Practice name and name of physician performing surgery? [facility]; DR. Vonna Kotyk [place]   6. What is the office phone number? [place]: SHERRI   7.   What is the office fax number? [place].   Anesthesia type (None, local, MAC, general) ? NONE LISTED   Jacinta Shoe 05/04/2020, 1:44 PM  _________________________________________________________________   (provider comments below)

## 2020-05-05 NOTE — Telephone Encounter (Signed)
Called and spoke to patient and made an appointment with Dr. Johnsie Cancel for pre-op clearance/yearly follow up on 08/07/20. Called and made Sherri aware at [employer]. Will remove this request from the pool.

## 2020-05-05 NOTE — Telephone Encounter (Signed)
Pre-op covering staff, we will need to clear patient closer to time of surgery. He was last seen by Dr. Johnsie Cancel in 09/2019 so he will need an in-office visit prior to procedure for pre-op evaluation. Can we get this scheduled for 07/2020 since surgery is not scheduled until 09/01/2020? Can we also let requesting surgeon's office know about this?  Thank you! Leyli Kevorkian

## 2020-07-29 NOTE — Progress Notes (Signed)
Cardiology Office Note    Date:  08/05/2020   ID:  Jay Bradshaw, DOB 06/29/1935, MRN [id number]  PCP:  Velna Hatchet, MD  Cardiologist:  Dr. Johnsie Cancel  CC: follow up   History of Present Illness:  Jay Bradshaw is a 85 y.o. male with a history of non obstructive CAD, emphysema, essential tremor, hypothyroidism who presents to clinic for cardiology follow up.  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 He moved to [place] from [place]. He established care with me  in 08/2015. He had reported having a cath in 2008 with no interventions.  LHC on 09/25/15 showed 20% prox RCA, 20% mRCA, 20% dLAD, 60% 1st Marg with moderate eccentric stenosis that did not appear to be flow limiting. Per cath report, PCI would be straight forward to do if he had chest pain c/w angina moving forward. Subsequently had uncomplicated left shoulder surgery Dr Mardelle Matte 10/02/2015   Seen by PA 08/2016 with chest pain Cath set up with Dr Angelena Form  Reviewed films: moderate disease in small OM with negative  FFR medical Rx   Some stress from wifes failing health No chest pain. Needs new nitro. Daughter lives in [place] And has 15 yo twins  Echo: 09/08/16 EF 09-62% grade 2 diastolic dysfunction mild RAE Reviewed  No real cardiac complaints Wife stays home most of time has bad knees He has some neuropathy in his feet which affects his balance   Needs right TKR with Raliegh Ip in October no cardiac issues not to proceed  Has had COVID vaccine   Past Medical History:  Diagnosis Date  . Anxiety   . Arthritis   . Balance problem    more noticed in the dark  . Complication of anesthesia    opioids causes urine retention  . Constipation    takes Colace daily as needed  . COPD (chronic obstructive pulmonary disease) ([place])   . Coronary artery disease    takes Metoprolol daily, Prox RCA lesion, 20 %stenosed, Mid RCA lesion 20% stenosed, Dist LAD lesion 20% stenosed EF greater than 65%,, 1st mrg lesion 60%  stenosed, mild non-obstructive disease in LAD and RCA, Stenosis of OM Branch appears moderate  . Emphysema lung ([place])   . Enlarged prostate    takes Tamsulosin daily  . History of echocardiogram 09/08/2016   EF 83-66% grade 2 diastolic dysfunction mild RAE Reviewed  . Hyperlipidemia    takes Lovastatin daily  . Hypothyroidism    takes Synthroid daily  . Joint pain   . Osteoarthritis of left shoulder 09/29/2015  . Osteoarthritis of right shoulder, primary 12/16/2014  . PONV (postoperative nausea and vomiting)   . Productive cough   . Seasonal allergies   . Shortness of breath dyspnea   . Tremors of nervous system    essential tremors  . Urinary frequency    takes Ditropan daily    Past Surgical History:  Procedure Laterality Date  . CARDIAC CATHETERIZATION N/A 09/25/2015   Procedure: Left Heart Cath and Coronary Angiography;  Surgeon: Burnell Blanks, MD;  Location: [facility];  Service: Cardiovascular;  Laterality: N/A;  . CARDIAC CATHETERIZATION N/A 08/29/2016   Procedure: Left Heart Cath and Coronary Angiography;  Surgeon: Burnell Blanks, MD;  Location: [facility];  Service: Cardiovascular;  Laterality: N/A;  . CATARACT EXTRACTION  Aug and Sep 2011   first left eye, then right eye  . COLONOSCOPY  June 2008   Dr. Lurena Joiner ref by  K[place]  . CYSTOSCOPY WITH INSERTION OF UROLIFT N/A 11/09/2017   Procedure: CYSTOSCOPY WITH INSERTION OF UROLIFT;  Surgeon: Franchot Gallo, MD;  Location: [facility];  Service: Urology;  Laterality: N/A;  . excision of fibroma  October 1994   in skin on back of neck right of midline, Dede Query ref by Lidia Collum  . HERNIA REPAIR    . IRRIGATION AND DEBRIDEMENT SEBACEOUS CYST  2010   from the back, [facility]  . LIPOMA EXCISION  Nov 2004   from neck, right side (near location of mandible cyst), Dr Lorrine Kin ref by Rasalen  . PROSTATE BIOPSY  2006   Dr. Leia Alf  . SHOULDER  HEMI-ARTHROPLASTY Right 12/16/2014   Procedure: SHOULDER HEMI-ARTHROPLASTY;  Surgeon: Johnny Bridge, MD;  Location: [facility];  Service: Orthopedics;  Laterality: Right;  . SHOULDER SURGERY Right 12/16/2014   hemi-arthroplasty    dr Mardelle Matte  . thyroid adenoma removal  August 1970   right side, [facility]  . TOTAL SHOULDER ARTHROPLASTY Right 12/16/2014   Procedure: RIGHT TOTAL SHOULDER ARTHROPLASTY;  Surgeon: Johnny Bridge, MD;  Location: [facility];  Service: Orthopedics;  Laterality: Right;  . TOTAL SHOULDER ARTHROPLASTY Left 09/29/2015  . TOTAL SHOULDER ARTHROPLASTY Left 09/29/2015   Procedure: TOTAL LEFT SHOULDER ARTHROPLASTY;  Surgeon: Marchia Bond, MD;  Location: [facility];  Service: Orthopedics;  Laterality: Left;  . vacuolar cyst removal     in right mandible fixed/filled, Denyce Robert ref by Synetta Shadow, DDS  . wisdom teeth extracted      Current Medications: Outpatient Medications Prior to Visit  Medication Sig Dispense Refill  . acetaminophen (TYLENOL) 325 MG tablet Take 650 mg by mouth every 6 (six) hours as needed.    Marland Kitchen aspirin 81 MG tablet Take 81 mg by mouth daily.    . beta carotene w/minerals (OCUVITE) tablet Take 1 tablet by mouth every other day.    . bisacodyl (DULCOLAX) 5 MG EC tablet Take 5 mg by mouth daily as needed for moderate constipation.    Marland Kitchen CALCIUM-MAGNESIUM PO Take 500-800 mg by mouth 2 (two) times daily.     . cholecalciferol (VITAMIN D) 400 UNITS TABS tablet Take 400 Units by mouth 2 (two) times daily.    . Coenzyme Q10 (CO Q10) 100 MG CAPS Take 200 mg by mouth daily.    Marland Kitchen levothyroxine (SYNTHROID, LEVOTHROID) 88 MCG tablet Take 88 mcg by mouth daily before breakfast.    . lovastatin (MEVACOR) 20 MG tablet Take 20 mg by mouth every morning.     . Melatonin 3 MG TABS Take 1 tablet by mouth at bedtime as needed (sleep).    . metoprolol succinate (TOPROL-XL) 50 MG 24 hr tablet Take 50 mg by mouth at bedtime.     . Multiple Vitamin (MULTIVITAMIN) capsule  Take 1 capsule by mouth every other day.     . nitroGLYCERIN (NITROSTAT) 0.4 MG SL tablet Place 1 tablet (0.4 mg total) under the tongue every 5 (five) minutes as needed for chest pain (up to 3 doses MAX). 25 tablet 3  . POTASSIUM PO Take 99 mg by mouth daily.     . PSYLLIUM HUSK PO Take 1 capsule by mouth 3 (three) times daily as needed (depending on meals he ate that day).     . saw palmetto 160 MG capsule Take 160 mg by mouth 3 (three) times daily.    . tamsulosin (FLOMAX) 0.4 MG CAPS capsule Take 0.4 mg by mouth  2 (two) times daily.     . vitamin C (ASCORBIC ACID) 500 MG tablet Take 500 mg by mouth every other day.     . vitamin E (VITAMIN E) 400 UNIT capsule Take 300 Units by mouth every other day.     Marland Kitchen alendronate (FOSAMAX) 70 MG tablet Take 70 mg by mouth every Saturday. Take with a full glass of water on an empty stomach.  (Patient not taking: Reported on 08/05/2020)     No facility-administered medications prior to visit.     Allergies:   Oxycodone   Social History   Socioeconomic History  . Marital status: Married    Spouse name: Not on file  . Number of children: Not on file  . Years of education: Not on file  . Highest education level: Not on file  Occupational History  . Occupation: retired    Comment: Adult nurse [employer]  Tobacco Use  . Smoking status: Former Smoker    Quit date: 11/02/1971    Years since quitting: 48.7  . Smokeless tobacco: Never Used  Vaping Use  . Vaping Use: Never used  Substance and Sexual Activity  . Alcohol use: Yes    Comment: 3 drinks weekly  . Drug use: No  . Sexual activity: Never  Other Topics Concern  . Not on file  Social History Narrative  . Not on file   Social Determinants of Health   Financial Resource Strain:   . Difficulty of Paying Living Expenses: Not on file  Food Insecurity:   . Worried About Charity fundraiser in the Last Year: Not on file  . Ran Out of Food in the Last Year: Not on file   Transportation Needs:   . Lack of Transportation (Medical): Not on file  . Lack of Transportation (Non-Medical): Not on file  Physical Activity:   . Days of Exercise per Week: Not on file  . Minutes of Exercise per Session: Not on file  Stress:   . Feeling of Stress : Not on file  Social Connections:   . Frequency of Communication with Friends and Family: Not on file  . Frequency of Social Gatherings with Friends and Family: Not on file  . Attends Religious Services: Not on file  . Active Member of Clubs or Organizations: Not on file  . Attends Archivist Meetings: Not on file  . Marital Status: Not on file     Family History:  The patient's family history includes Breast cancer in his mother; Hypertension in his mother; Prostate cancer in his brother; Tremor in his brother, daughter, and father.     ROS:   Please see the history of present illness.    ROS All other systems reviewed and are negative.   PHYSICAL EXAM:   VS:  BP 124/72   Pulse 61   Ht 6' (1.829 m)   Wt 185 lb 6.4 oz (84.1 kg)   BMI 25.14 kg/m    Affect appropriate Healthy:  appears stated age 58: normal Neck supple with no adenopathy JVP normal no bruits no thyromegaly Lungs clear with no wheezing and good diaphragmatic motion Heart:  S1/S2 no murmur, no rub, gallop or click PMI normal Abdomen: benighn, BS positve, no tenderness, no AAA no bruit.  No HSM or HJR Distal pulses intact with no bruits Trace LE  edema Neuro non-focal essential UE tremors  Skin warm and dry Arthritis in right knee with decreased ROM   Wt Readings  from Last 3 Encounters:  08/05/20 185 lb 6.4 oz (84.1 kg)  10/10/19 188 lb (85.3 kg)  11/01/18 183 lb (83 kg)      Studies/Labs Reviewed:   EKG:  SR rate 61 normal 08/05/20  Recent Labs: No results found for requested labs within last 8760 hours.   Lipid Panel No results found for: CHOL, TRIG, HDL, CHOLHDL, VLDL, LDLCALC, LDLDIRECT  Additional studies/  records that were reviewed today include:  Myoview 09/21/16 Study Highlights   The left ventricular ejection fraction is normal (55-65%).  Nuclear stress EF: 65%.  ST segment depression was noted during stress in the II, III, aVF, V5 and V6 leads.  This is a low risk study.   Low risk stress nuclear study with ECG changes; small, moderate intensity, reversible inferior basal defect consistent with mild inferior ischemia; EF 65 with normal wall motion.      Cardiac catheterization 08/29/16  Conclusion    Prox RCA lesion, 20% stenosed.  Mid RCA lesion, 20% stenosed.  Dist LAD lesion, 20% stenosed.  The left ventricular systolic function is normal.  1st Mrg lesion, 60% stenosed. The vessel is small to moderate in size. The stenosis is eccentric and does not appear to be flow limiting.   Recommendations: He has a moderate eccentric stenosis in the first OM branch. This is a small to medium sized vessel. The lesion does not appear to be flow limiting. With upcoming surgery, we have discussed medical management of his CAD for now. He would prefer to attempt medical management for now. Will proceed with surgery next week. If he has chest pain c/w angina going forward, could bring him back for re-look cath and PCI of the OM which would be technically easy to do.      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 Echo with normal RV/LV function   Preoperative:  No angina small vessel disease no stents ok to proceed with right TKR in October      F/U with me in a year   Jenkins Rouge

## 2020-08-05 ENCOUNTER — Ambulatory Visit: Payer: Medicare HMO | Admitting: Cardiovascular Disease

## 2020-08-05 ENCOUNTER — Other Ambulatory Visit: Payer: Self-pay

## 2020-08-05 ENCOUNTER — Encounter: Payer: Self-pay | Admitting: Cardiovascular Disease

## 2020-08-05 VITALS — BP 124/72 | HR 61 | Ht 72.0 in | Wt 185.4 lb

## 2020-08-05 DIAGNOSIS — Z01810 Encounter for preprocedural cardiovascular examination: Secondary | ICD-10-CM | POA: Diagnosis not present

## 2020-08-05 DIAGNOSIS — Z01818 Encounter for other preprocedural examination: Secondary | ICD-10-CM

## 2020-08-05 NOTE — Patient Instructions (Signed)

## 2020-08-07 ENCOUNTER — Ambulatory Visit: Payer: Medicare HMO | Admitting: Cardiovascular Disease

## 2020-08-24 ENCOUNTER — Other Ambulatory Visit (HOSPITAL_COMMUNITY): Payer: Medicare HMO

## 2020-09-10 NOTE — Progress Notes (Signed)
COVID Vaccine Completed: Date COVID Vaccine completed: COVID vaccine manufacturer: [place]   PCP - Velna Hatchet, MD Cardiologist - Jenkins Rouge, MD  Clearance in note dated 08-05-20 from Dr. Johnsie Cancel  Chest x-ray -  EKG - 08-05-20 in Epic Stress Test - 09-22-15 in Epic ECHO - 09-08-16 in Epic Cardiac Cath - 08-29-16. 2016 and 2010 in [place] Pacemaker/ICD device last checked:  Sleep Study -  CPAP -   Fasting Blood Sugar -  Checks Blood Sugar _____ times a day  Blood Thinner Instructions: Aspirin Instructions: Last Dose:  Anesthesia review: CAD, HTN  Patient denies shortness of breath, fever, cough and chest pain at PAT appointment   Patient verbalized understanding of instructions that were given to them at the PAT appointment. Patient was also instructed that they will need to review over the PAT instructions again at home before surgery.

## 2020-09-10 NOTE — Patient Instructions (Signed)
DUE TO COVID-19 ONLY ONE VISITOR IS ALLOWED TO COME WITH YOU AND STAY IN THE WAITING ROOM ONLY  DURING PRE OP AND PROCEDURE.   IF YOU WILL BE ADMITTED INTO THE HOSPITAL YOU ARE ALLOWED ONE SUPPORT PERSON DURING VISITATION  HOURS ONLY (10AM -8PM)    The support person may change daily.  The support person must pass our screening, gel in and out, and wear a mask at all times, including in the patients room.  Patients must also wear a mask when staff or their support person are in the room.   COVID SWAB TESTING MUST BE COMPLETED ON:   Friday, 09-18-20 @   [address] [place], [place] [address]  (Must self quarantine after testing. Follow instructions on  handout.)    Your procedure is scheduled on:  Tuesday, 09-22-20   Report to [facility] Main  Entrance   Report to admitting at 10:15 AM   Call this number if you have problems the morning of surgery [phone]   Do not eat food :After Midnight.   May have liquids until 9:45 AM day of surgery  CLEAR LIQUID DIET  Foods Allowed                                                                     Foods Excluded  Water, Black Coffee and tea, regular and decaf                liquids that you cannot  Plain Jell-O in any flavor  (No red)                                      see through such as: Fruit ices (not with fruit pulp)                                      milk, soups, orange juice              Iced Popsicles (No red)                                      All solid food                                   Apple juices Sports drinks like Gatorade (No red) Lightly seasoned clear broth or consume(fat free) Sugar, honey syrup   Complete one Ensure drink the morning of surgery at  9:45 AM  the day of surgery.     Oral Hygiene is also important to reduce your risk of infection.                                    Remember - BRUSH YOUR TEETH THE MORNING OF SURGERY WITH YOUR REGULAR TOOTHPASTE   Do NOT smoke after  Midnight   Take these medicines  the morning of surgery with A SIP OF WATER:  Levothyroxine, Lovastatin, Tamsulosin                               You may not have any metal on your body including  jewelry, and body piercings             Do not wear lotions, powders, perfumes/cologne, or deodorant             Men may shave face and neck.   Do not bring valuables to the hospital. [place].   Contacts, dentures or bridgework may not be worn into surgery.   Bring small overnight bag day of surgery.    Special Instructions: Bring a copy of your healthcare power of attorney and living will documents         the day of surgery if you haven't scanned them in before.              Please read over the following fact sheets you were given: IF YOU HAVE QUESTIONS ABOUT YOUR PRE OP INSTRUCTIONS PLEASE CALL [phone]   [place] - Preparing for Surgery Before surgery, you can play an important role.  Because skin is not sterile, your skin needs to be as free of germs as possible.  You can reduce the number of germs on your skin by washing with CHG (chlorahexidine gluconate) soap before surgery.  CHG is an antiseptic cleaner which kills germs and bonds with the skin to continue killing germs even after washing. Please DO NOT use if you have an allergy to CHG or antibacterial soaps.  If your skin becomes reddened/irritated stop using the CHG and inform your nurse when you arrive at Short Stay. Do not shave (including legs and underarms) for at least 48 hours prior to the first CHG shower.  You may shave your face/neck.  Please follow these instructions carefully:  1.  Shower with CHG Soap the night before surgery and the  morning of surgery.  2.  If you choose to wash your hair, wash your hair first as usual with your normal  shampoo.  3.  After you shampoo, rinse your hair and body thoroughly to remove the shampoo.                             4.  Use CHG as you  would any other liquid soap.  You can apply chg directly to the skin and wash.  Gently with a scrungie or clean washcloth.  5.  Apply the CHG Soap to your body ONLY FROM THE NECK DOWN.   Do   not use on face/ open                           Wound or open sores. Avoid contact with eyes, ears mouth and   genitals (private parts).                       Wash face,  Genitals (private parts) with your normal soap.             6.  Wash thoroughly, paying special attention to the area where your    surgery  will be performed.  7.  Thoroughly rinse your body with warm water  from the neck down.  8.  DO NOT shower/wash with your normal soap after using and rinsing off the CHG Soap.                9.  Pat yourself dry with a clean towel.            10.  Wear clean pajamas.            11.  Place clean sheets on your bed the night of your first shower and do not  sleep with pets. Day of Surgery : Do not apply any lotions/deodorants the morning of surgery.  Please wear clean clothes to the hospital/surgery center.  FAILURE TO FOLLOW THESE INSTRUCTIONS MAY RESULT IN THE CANCELLATION OF YOUR SURGERY  PATIENT SIGNATURE_________________________________  NURSE SIGNATURE__________________________________  ________________________________________________________________________   Jay Bradshaw  An incentive spirometer is a tool that can help keep your lungs clear and active. This tool measures how well you are filling your lungs with each breath. Taking long deep breaths may help reverse or decrease the chance of developing breathing (pulmonary) problems (especially infection) following:  A long period of time when you are unable to move or be active. BEFORE THE PROCEDURE   If the spirometer includes an indicator to show your best effort, your nurse or respiratory therapist will set it to a desired goal.  If possible, sit up straight or lean slightly forward. Try not to slouch.  Hold the incentive  spirometer in an upright position. INSTRUCTIONS FOR USE  1. Sit on the edge of your bed if possible, or sit up as far as you can in bed or on a chair. 2. Hold the incentive spirometer in an upright position. 3. Breathe out normally. 4. Place the mouthpiece in your mouth and seal your lips tightly around it. 5. Breathe in slowly and as deeply as possible, raising the piston or the ball toward the top of the column. 6. Hold your breath for 3-5 seconds or for as long as possible. Allow the piston or ball to fall to the bottom of the column. 7. Remove the mouthpiece from your mouth and breathe out normally. 8. Rest for a few seconds and repeat Steps 1 through 7 at least 10 times every 1-2 hours when you are awake. Take your time and take a few normal breaths between deep breaths. 9. The spirometer may include an indicator to show your best effort. Use the indicator as a goal to work toward during each repetition. 10. After each set of 10 deep breaths, practice coughing to be sure your lungs are clear. If you have an incision (the cut made at the time of surgery), support your incision when coughing by placing a pillow or rolled up towels firmly against it. Once you are able to get out of bed, walk around indoors and cough well. You may stop using the incentive spirometer when instructed by your caregiver.  RISKS AND COMPLICATIONS  Take your time so you do not get dizzy or light-headed.  If you are in pain, you may need to take or ask for pain medication before doing incentive spirometry. It is harder to take a deep breath if you are having pain. AFTER USE  Rest and breathe slowly and easily.  It can be helpful to keep track of a log of your progress. Your caregiver can provide you with a simple table to help with this. If you are using the spirometer at home, follow these instructions: [facility]  CARE IF:   You are having difficultly using the spirometer.  You have trouble using the  spirometer as often as instructed.  Your pain medication is not giving enough relief while using the spirometer.  You develop fever of 100.5 F (38.1 C) or higher. SEEK IMMEDIATE MEDICAL CARE IF:   You cough up bloody sputum that had not been present before.  You develop fever of 102 F (38.9 C) or greater.  You develop worsening pain at or near the incision site. MAKE SURE YOU:   Understand these instructions.  Will watch your condition.  Will get help right away if you are not doing well or get worse. Document Released: 03/27/2007 Document Revised: 02/06/2012 Document Reviewed: 05/28/2007 [facility] Patient Information 2014 [place], [place].   ________________________________________________________________________

## 2020-09-11 ENCOUNTER — Encounter (HOSPITAL_COMMUNITY)
Admission: RE | Admit: 2020-09-11 | Discharge: 2020-09-11 | Disposition: A | Discharge status: Home / Self Care | Payer: Medicare HMO | Source: Ambulatory Visit | Attending: Orthopedic Surgery | Admitting: Orthopedic Surgery

## 2020-09-11 NOTE — Care Plan (Signed)
Ortho Bundle Case Management Note  Patient Details  Name: Jay Bradshaw MRN: [id number] Date of Birth: 09-07-1935 Spoke with patient prior to surgery. He will discharge to home with family to assist. Has walker at home. [facility] as been arranged for at TKR and OPPT has been arranged if Partial is completed. Will follow up with appropriate referral after surgery.  Patient and MD in agreement with plan. Choice offered                     DME Arranged:    DME Agency:     HH Arranged:  PT HH Agency:  [facility] (formerly [facility])  Additional Comments: Please contact me with any questions of if this plan should need to change.  Ladell Heads,  [place] [facility] Specialist  [phone] 09/11/2020, 9:25 AM

## 2020-09-15 ENCOUNTER — Encounter (HOSPITAL_COMMUNITY)
Admission: RE | Admit: 2020-09-15 | Discharge: 2020-09-15 | Disposition: A | Discharge status: Home / Self Care | Payer: Medicare HMO | Source: Ambulatory Visit | Attending: Orthopedic Surgery | Admitting: Orthopedic Surgery

## 2020-09-15 ENCOUNTER — Encounter (HOSPITAL_COMMUNITY): Payer: Self-pay

## 2020-09-15 ENCOUNTER — Other Ambulatory Visit: Payer: Self-pay

## 2020-09-15 DIAGNOSIS — Z01812 Encounter for preprocedural laboratory examination: Secondary | ICD-10-CM | POA: Insufficient documentation

## 2020-09-15 HISTORY — DX: Essential (primary) hypertension: I10

## 2020-09-15 LAB — SURGICAL PCR SCREEN
MRSA, PCR: NEGATIVE
Staphylococcus aureus: NEGATIVE

## 2020-09-15 LAB — CBC
HCT: 41.1 % (ref 39.0–52.0)
Hemoglobin: 13.2 g/dL (ref 13.0–17.0)
MCH: 30.3 pg (ref 26.0–34.0)
MCHC: 32.1 g/dL (ref 30.0–36.0)
MCV: 94.3 fL (ref 80.0–100.0)
Platelets: 247 10*3/uL (ref 150–400)
RBC: 4.36 MIL/uL (ref 4.22–5.81)
RDW: 14 % (ref 11.5–15.5)
WBC: 7 10*3/uL (ref 4.0–10.5)
nRBC: 0 % (ref 0.0–0.2)

## 2020-09-15 LAB — BASIC METABOLIC PANEL
Anion gap: 8 (ref 5–15)
BUN: 23 mg/dL (ref 8–23)
CO2: 27 mmol/L (ref 22–32)
Calcium: 9 mg/dL (ref 8.9–10.3)
Chloride: 105 mmol/L (ref 98–111)
Creatinine, Ser: 0.93 mg/dL (ref 0.61–1.24)
GFR, Estimated: >60 mL/min (ref >60)
Glucose, Bld: 109 mg/dL — ABNORMAL HIGH (ref 70–99)
Potassium: 4.4 mmol/L (ref 3.5–5.1)
Sodium: 140 mmol/L (ref 135–145)

## 2020-09-15 NOTE — Patient Instructions (Signed)
DUE TO COVID-19 ONLY ONE VISITOR IS ALLOWED TO COME WITH YOU AND STAY IN THE WAITING ROOM ONLY DURING PRE OP AND PROCEDURE.   IF YOU WILL BE ADMITTED INTO THE HOSPITAL YOU ARE ALLOWED ONE SUPPORT PERSON DURING VISITATION HOURS ONLY (10AM -8PM)   . The support person may change daily. . The support person must pass our screening, gel in and out, and wear a mask at all times, including in the patient's room. . Patients must also wear a mask when staff or their support person are in the room.   COVID SWAB TESTING MUST BE COMPLETED ON:  09-18-20 @ 2 PM   [address] [place], [place] [address]  (Must self quarantine after testing. Follow instructions on handout.)        Your procedure is scheduled on:  Tuesday, 09-22-20   Report to [facility] 2 Main  Entrance   Report to Admitting at 10:15 AM   [facility])   Call this number if you have problems the morning of surgery [phone]   Do not eat food :After Midnight.   May have liquids until 9:45 AM day of surgery  CLEAR LIQUID DIET  Foods Allowed                                                                     Foods Excluded  Water, Black Coffee and tea, regular and decaf                             liquids that you cannot  Plain Jell-O in any flavor  (No red)                                           see through such as: Fruit ices (not with fruit pulp)                                     milk, soups, orange juice              Iced Popsicles (No red)                                    All solid food                                   Apple juices Sports drinks like Gatorade (No red) Lightly seasoned clear broth or consume(fat free) Sugar, honey syrup     Complete one Ensure drink the morning of surgery at 9:45 AM  the day of surgery.     Oral Hygiene is also important to reduce your risk of infection.                                    Remember - BRUSH YOUR TEETH  THE MORNING OF SURGERY WITH YOUR REGULAR  TOOTHPASTE   Do NOT smoke after Midnight   Take these medicines the morning of surgery with A SIP OF WATER: Levothyroxine, Lovastatin                               You may not have any metal on your body including  jewelry, and body piercings             Do not wear lotions, powders, perfumes/cologne, or deodorant             Men may shave face and neck.   Do not bring valuables to the hospital. [place].   Contacts, dentures or bridgework may not be worn into surgery.   Bring small overnight bag day of surgery.                Please read over the following fact sheets you were given: IF YOU HAVE QUESTIONS ABOUT YOUR PRE OP INSTRUCTIONS PLEASE CALL [phone]   [facility] - Preparing for Surgery Before surgery, you can play an important role.  Because skin is not sterile, your skin needs to be as free of germs as possible.  You can reduce the number of germs on your skin by washing with CHG (chlorahexidine gluconate) soap before surgery.  CHG is an antiseptic cleaner which kills germs and bonds with the skin to continue killing germs even after washing. Please DO NOT use if you have an allergy to CHG or antibacterial soaps.  If your skin becomes reddened/irritated stop using the CHG and inform your nurse when you arrive at Short Stay. Do not shave (including legs and underarms) for at least 48 hours prior to the first CHG shower.  You may shave your face/neck.  Please follow these instructions carefully:  1.  Shower with CHG Soap the night before surgery and the  morning of surgery.  2.  If you choose to wash your hair, wash your hair first as usual with your normal  shampoo.  3.  After you shampoo, rinse your hair and body thoroughly to remove the shampoo.                             4.  Use CHG as you would any other liquid soap.  You can apply chg directly to the skin and wash.  Gently with a scrungie or clean washcloth.  5.  Apply the CHG  Soap to your body ONLY FROM THE NECK DOWN.   Do   not use on face/ open                           Wound or open sores. Avoid contact with eyes, ears mouth and   genitals (private parts).                       Wash face,  Genitals (private parts) with your normal soap.             6.  Wash thoroughly, paying special attention to the area where your    surgery  will be performed.  7.  Thoroughly rinse your body with warm water from the neck down.  8.  DO NOT shower/wash with your normal soap after using  and rinsing off the CHG Soap.                9.  Pat yourself dry with a clean towel.            10.  Wear clean pajamas.            11.  Place clean sheets on your bed the night of your first shower and do not  sleep with pets. Day of Surgery : Do not apply any lotions/deodorants the morning of surgery.  Please wear clean clothes to the hospital/surgery center.  FAILURE TO FOLLOW THESE INSTRUCTIONS MAY RESULT IN THE CANCELLATION OF YOUR SURGERY  PATIENT SIGNATURE_________________________________  NURSE SIGNATURE__________________________________  ________________________________________________________________________   Adam Phenix  An incentive spirometer is a tool that can help keep your lungs clear and active. This tool measures how well you are filling your lungs with each breath. Taking long deep breaths may help reverse or decrease the chance of developing breathing (pulmonary) problems (especially infection) following:  A long period of time when you are unable to move or be active. BEFORE THE PROCEDURE   If the spirometer includes an indicator to show your best effort, your nurse or respiratory therapist will set it to a desired goal.  If possible, sit up straight or lean slightly forward. Try not to slouch.  Hold the incentive spirometer in an upright position. INSTRUCTIONS FOR USE  1. Sit on the edge of your bed if possible, or sit up as far as you can in bed or on  a chair. 2. Hold the incentive spirometer in an upright position. 3. Breathe out normally. 4. Place the mouthpiece in your mouth and seal your lips tightly around it. 5. Breathe in slowly and as deeply as possible, raising the piston or the ball toward the top of the column. 6. Hold your breath for 3-5 seconds or for as long as possible. Allow the piston or ball to fall to the bottom of the column. 7. Remove the mouthpiece from your mouth and breathe out normally. 8. Rest for a few seconds and repeat Steps 1 through 7 at least 10 times every 1-2 hours when you are awake. Take your time and take a few normal breaths between deep breaths. 9. The spirometer may include an indicator to show your best effort. Use the indicator as a goal to work toward during each repetition. 10. After each set of 10 deep breaths, practice coughing to be sure your lungs are clear. If you have an incision (the cut made at the time of surgery), support your incision when coughing by placing a pillow or rolled up towels firmly against it. Once you are able to get out of bed, walk around indoors and cough well. You may stop using the incentive spirometer when instructed by your caregiver.  RISKS AND COMPLICATIONS  Take your time so you do not get dizzy or light-headed.  If you are in pain, you may need to take or ask for pain medication before doing incentive spirometry. It is harder to take a deep breath if you are having pain. AFTER USE  Rest and breathe slowly and easily.  It can be helpful to keep track of a log of your progress. Your caregiver can provide you with a simple table to help with this. If you are using the spirometer at home, follow these instructions: [place] IF:   You are having difficultly using the spirometer.  You have trouble using  the spirometer as often as instructed.  Your pain medication is not giving enough relief while using the spirometer.  You develop fever of 100.5 F  (38.1 C) or higher. SEEK IMMEDIATE MEDICAL CARE IF:   You cough up bloody sputum that had not been present before.  You develop fever of 102 F (38.9 C) or greater.  You develop worsening pain at or near the incision site. MAKE SURE YOU:   Understand these instructions.  Will watch your condition.  Will get help right away if you are not doing well or get worse. Document Released: 03/27/2007 Document Revised: 02/06/2012 Document Reviewed: 05/28/2007 [facility] Patient Information 2014 [place], [place].   ________________________________________________________________________

## 2020-09-15 NOTE — Progress Notes (Signed)
COVID Vaccine Completed:  x3 Date COVID Vaccine completed:  February 2021 second dose COVID vaccine manufacturer: [employer]   PCP - Velna Hatchet, MD Cardiologist - Jenkins Rouge, MD  Clearance in note dated 08-05-20 from Dr. Johnsie Cancel  Chest x-ray -  EKG - 08-05-20 in Epic Stress Test - 09-22-15 in Epic ECHO - 09-08-16 in Epic Cardiac Cath - 08-29-16. 2016 and 2010 in [place] Pacemaker/ICD device last checked:  Sleep Study -  CPAP -   Fasting Blood Sugar -  Checks Blood Sugar _____ times a day  Blood Thinner Instructions: Aspirin Instructions:  ASA 81 mg Last Dose:  Anesthesia review: CAD, COPD  Patient denies shortness of breath, fever, cough and chest pain at PAT appointment   Patient verbalized understanding of instructions that were given to them at the PAT appointment. Patient was also instructed that they will need to review over the PAT instructions again at home before surgery.

## 2020-09-15 NOTE — Anesthesia Preprocedure Evaluation (Addendum)
Anesthesia Evaluation  Patient identified by MRN, date of birth, ID band Patient awake    Reviewed: Allergy & Precautions, NPO status , Patient's Chart, lab work & pertinent test results  History of Anesthesia Complications (+) PONV and history of anesthetic complications  Airway Mallampati: II  TM Distance: >3 FB Neck ROM: Full    Dental no notable dental hx.    Pulmonary COPD, former smoker,    Pulmonary exam normal breath sounds clear to auscultation       Cardiovascular Exercise Tolerance: Good METS: 3 - Mets hypertension, Pt. on home beta blockers + CAD  Normal cardiovascular exam Rhythm:Regular Rate:Normal  07/2020 EKG reviewed  2017 Echo: EF 70-62% grade 2 diastolic dysfunction mild RAE Reviewed   Neuro/Psych Anxiety Essential tremors    GI/Hepatic negative GI ROS, Neg liver ROS,   Endo/Other  Hypothyroidism   Renal/GU negative Renal ROS     Musculoskeletal  (+) Arthritis , Osteoarthritis,    Abdominal   Peds  Hematology negative hematology ROS (+)   Anesthesia Other Findings   Reproductive/Obstetrics                           Anesthesia Physical Anesthesia Plan  ASA: III  Anesthesia Plan: MAC, Spinal and Regional   Post-op Pain Management:    Induction: Intravenous  PONV Risk Score and Plan: 2 and Propofol infusion, TIVA and Treatment may vary due to age or medical condition  Airway Management Planned: Simple Face Mask and Natural Airway  Additional Equipment:   Intra-op Plan:   Post-operative Plan:   Informed Consent: I have reviewed the patients History and Physical, chart, labs and discussed the procedure including the risks, benefits and alternatives for the proposed anesthesia with the patient or authorized representative who has indicated his/her understanding and acceptance.       Plan Discussed with: CRNA and Anesthesiologist  Anesthesia Plan  Comments: (Per cardiology 08/05/2020, "Preoperative:  No angina small vessel disease no stents ok to proceed with right TKR in October")      Anesthesia Quick Evaluation

## 2020-09-18 ENCOUNTER — Other Ambulatory Visit (HOSPITAL_COMMUNITY)
Admission: RE | Admit: 2020-09-18 | Discharge: 2020-09-18 | Disposition: A | Discharge status: Home / Self Care | Payer: Medicare HMO | Source: Ambulatory Visit | Attending: Orthopedic Surgery | Admitting: Orthopedic Surgery

## 2020-09-18 DIAGNOSIS — Z01812 Encounter for preprocedural laboratory examination: Secondary | ICD-10-CM | POA: Insufficient documentation

## 2020-09-18 DIAGNOSIS — Z20822 Contact with and (suspected) exposure to covid-19: Secondary | ICD-10-CM | POA: Insufficient documentation

## 2020-09-18 LAB — SARS CORONAVIRUS 2 (TAT 6-24 HRS): SARS Coronavirus 2: NEGATIVE

## 2020-09-21 NOTE — Progress Notes (Signed)
Spoke to patient and gave him new arrival time of 9:30 and he was also advised that he can have clear liquids from midnight until 9:00 AM.  Patient stated understanding.

## 2020-09-22 ENCOUNTER — Encounter (HOSPITAL_COMMUNITY)
Admission: RE | Disposition: A | Discharge status: Home / Self Care | Payer: Self-pay | Source: Home / Self Care | Attending: Orthopedic Surgery

## 2020-09-22 ENCOUNTER — Other Ambulatory Visit: Payer: Self-pay

## 2020-09-22 ENCOUNTER — Observation Stay (HOSPITAL_COMMUNITY): Payer: Medicare HMO

## 2020-09-22 ENCOUNTER — Ambulatory Visit (HOSPITAL_COMMUNITY): Payer: Medicare HMO | Admitting: Anesthesiology

## 2020-09-22 ENCOUNTER — Encounter (HOSPITAL_COMMUNITY): Payer: Self-pay | Admitting: Orthopedic Surgery

## 2020-09-22 ENCOUNTER — Observation Stay (HOSPITAL_COMMUNITY)
Admission: RE | Admit: 2020-09-22 | Discharge: 2020-09-23 | Disposition: A | Discharge status: Home / Self Care | Payer: Medicare HMO | Attending: Orthopedic Surgery | Admitting: Orthopedic Surgery

## 2020-09-22 ENCOUNTER — Ambulatory Visit (HOSPITAL_COMMUNITY): Payer: Medicare HMO | Admitting: Physician Assistant

## 2020-09-22 DIAGNOSIS — Z79899 Other long term (current) drug therapy: Secondary | ICD-10-CM | POA: Insufficient documentation

## 2020-09-22 DIAGNOSIS — I2510 Atherosclerotic heart disease of native coronary artery without angina pectoris: Secondary | ICD-10-CM | POA: Insufficient documentation

## 2020-09-22 DIAGNOSIS — M1711 Unilateral primary osteoarthritis, right knee: Secondary | ICD-10-CM | POA: Diagnosis not present

## 2020-09-22 DIAGNOSIS — E039 Hypothyroidism, unspecified: Secondary | ICD-10-CM | POA: Diagnosis not present

## 2020-09-22 DIAGNOSIS — Z7982 Long term (current) use of aspirin: Secondary | ICD-10-CM | POA: Diagnosis not present

## 2020-09-22 DIAGNOSIS — Z96651 Presence of right artificial knee joint: Secondary | ICD-10-CM

## 2020-09-22 DIAGNOSIS — Z87891 Personal history of nicotine dependence: Secondary | ICD-10-CM | POA: Insufficient documentation

## 2020-09-22 DIAGNOSIS — J449 Chronic obstructive pulmonary disease, unspecified: Secondary | ICD-10-CM | POA: Insufficient documentation

## 2020-09-22 DIAGNOSIS — I119 Hypertensive heart disease without heart failure: Secondary | ICD-10-CM | POA: Diagnosis not present

## 2020-09-22 HISTORY — PX: PARTIAL KNEE ARTHROPLASTY: SHX2174

## 2020-09-22 SURGERY — ARTHROPLASTY, KNEE, UNICOMPARTMENTAL
Anesthesia: Monitor Anesthesia Care | Site: Knee | Laterality: Right

## 2020-09-22 MED ORDER — PHENYLEPHRINE HCL (PRESSORS) 10 MG/ML IV SOLN
INTRAVENOUS | Status: AC
  Filled 2020-09-22: qty 1

## 2020-09-22 MED ORDER — TAMSULOSIN HCL 0.4 MG PO CAPS: 0.4000 mg | ORAL_CAPSULE | ORAL | Status: DC

## 2020-09-22 MED ORDER — PHENOL 1.4 % MT LIQD: 1.0000 | OROMUCOSAL | Status: DC | PRN

## 2020-09-22 MED ORDER — DEXAMETHASONE SODIUM PHOSPHATE 10 MG/ML IJ SOLN
INTRAMUSCULAR | Status: AC
  Filled 2020-09-22: qty 1

## 2020-09-22 MED ORDER — KETOROLAC TROMETHAMINE 30 MG/ML IJ SOLN
INTRAMUSCULAR | Status: AC
  Filled 2020-09-22: qty 1

## 2020-09-22 MED ORDER — CEFAZOLIN SODIUM-DEXTROSE 2-4 GM/100ML-% IV SOLN
2.0000 g | INTRAVENOUS | Status: AC
  Administered 2020-09-22: 2 g via INTRAVENOUS
  Filled 2020-09-22: qty 100

## 2020-09-22 MED ORDER — 0.9 % SODIUM CHLORIDE (POUR BTL) OPTIME
TOPICAL | Status: DC | PRN
  Administered 2020-09-22: 1000 mL

## 2020-09-22 MED ORDER — VITAMIN E 45 MG (100 UNIT) PO CAPS
300.0000 [IU] | ORAL_CAPSULE | ORAL | Status: DC
  Administered 2020-09-23: 300 [IU] via ORAL
  Filled 2020-09-22: qty 3

## 2020-09-22 MED ORDER — CALCIUM-MAGNESIUM 100-50 MG PO TABS: 1.0000 | ORAL_TABLET | Freq: Two times a day (BID) | ORAL | Status: DC

## 2020-09-22 MED ORDER — POLYETHYLENE GLYCOL 3350 17 G PO PACK: 17.0000 g | PACK | Freq: Every day | ORAL | Status: DC | PRN

## 2020-09-22 MED ORDER — ONDANSETRON HCL 4 MG PO TABS: 4.0000 mg | ORAL_TABLET | Freq: Four times a day (QID) | ORAL | Status: DC | PRN

## 2020-09-22 MED ORDER — ASCORBIC ACID 500 MG PO TABS
500.0000 mg | ORAL_TABLET | ORAL | Status: DC
  Administered 2020-09-23: 500 mg via ORAL
  Filled 2020-09-22: qty 1

## 2020-09-22 MED ORDER — FENTANYL CITRATE (PF) 100 MCG/2ML IJ SOLN: 25.0000 ug | INTRAMUSCULAR | Status: DC | PRN

## 2020-09-22 MED ORDER — PHENYLEPHRINE HCL-NACL 10-0.9 MG/250ML-% IV SOLN
INTRAVENOUS | Status: DC | PRN
  Administered 2020-09-22: 50 ug/min via INTRAVENOUS

## 2020-09-22 MED ORDER — SODIUM CHLORIDE 0.9 % IR SOLN
Status: DC | PRN
  Administered 2020-09-22: 1000 mL

## 2020-09-22 MED ORDER — CO Q10 100 MG PO CAPS: 200.0000 mg | ORAL_CAPSULE | Freq: Every day | ORAL | Status: DC

## 2020-09-22 MED ORDER — BUPIVACAINE HCL 0.25 % IJ SOLN
INTRAMUSCULAR | Status: AC
  Filled 2020-09-22: qty 1

## 2020-09-22 MED ORDER — NITROGLYCERIN 0.4 MG SL SUBL: 0.4000 mg | SUBLINGUAL_TABLET | SUBLINGUAL | Status: DC | PRN

## 2020-09-22 MED ORDER — HYDROCODONE-ACETAMINOPHEN 7.5-325 MG PO TABS: 1.0000 | ORAL_TABLET | ORAL | Status: DC | PRN

## 2020-09-22 MED ORDER — DOCUSATE SODIUM 100 MG PO CAPS
100.0000 mg | ORAL_CAPSULE | Freq: Two times a day (BID) | ORAL | Status: DC
  Administered 2020-09-23: 100 mg via ORAL
  Filled 2020-09-22 (×2): qty 1

## 2020-09-22 MED ORDER — PROPOFOL 10 MG/ML IV BOLUS
INTRAVENOUS | Status: AC
  Filled 2020-09-22: qty 20

## 2020-09-22 MED ORDER — LIDOCAINE HCL (CARDIAC) PF 100 MG/5ML IV SOSY
PREFILLED_SYRINGE | INTRAVENOUS | Status: DC | PRN
  Administered 2020-09-22: 100 mg via INTRATRACHEAL

## 2020-09-22 MED ORDER — KETOROLAC TROMETHAMINE 15 MG/ML IJ SOLN
7.5000 mg | Freq: Four times a day (QID) | INTRAMUSCULAR | Status: AC
  Administered 2020-09-22 – 2020-09-23 (×4): 7.5 mg via INTRAVENOUS
  Filled 2020-09-22 (×4): qty 1

## 2020-09-22 MED ORDER — METHOCARBAMOL 500 MG IVPB - SIMPLE MED
500.0000 mg | Freq: Four times a day (QID) | INTRAVENOUS | Status: DC | PRN
  Filled 2020-09-22: qty 50

## 2020-09-22 MED ORDER — METOCLOPRAMIDE HCL 5 MG PO TABS: 5.0000 mg | ORAL_TABLET | Freq: Three times a day (TID) | ORAL | Status: DC | PRN

## 2020-09-22 MED ORDER — METHOCARBAMOL 500 MG PO TABS
500.0000 mg | ORAL_TABLET | Freq: Four times a day (QID) | ORAL | Status: DC | PRN
  Administered 2020-09-22 – 2020-09-23 (×2): 500 mg via ORAL
  Filled 2020-09-22 (×2): qty 1

## 2020-09-22 MED ORDER — PROPOFOL 500 MG/50ML IV EMUL
INTRAVENOUS | Status: DC | PRN
  Administered 2020-09-22: 100 ug/kg/min via INTRAVENOUS

## 2020-09-22 MED ORDER — BISACODYL 10 MG RE SUPP: 10.0000 mg | Freq: Every day | RECTAL | Status: DC | PRN

## 2020-09-22 MED ORDER — DEXAMETHASONE SODIUM PHOSPHATE 10 MG/ML IJ SOLN
INTRAMUSCULAR | Status: DC | PRN
  Administered 2020-09-22: 10 mg via INTRAVENOUS

## 2020-09-22 MED ORDER — ORAL CARE MOUTH RINSE: 15.0000 mL | Freq: Once | OROMUCOSAL | Status: AC

## 2020-09-22 MED ORDER — POTASSIUM GLUCONATE 595 (99 K) MG PO TABS
595.0000 mg | ORAL_TABLET | Freq: Every day | ORAL | Status: DC
  Administered 2020-09-22: 595 mg via ORAL
  Filled 2020-09-22: qty 1

## 2020-09-22 MED ORDER — METOCLOPRAMIDE HCL 5 MG/ML IJ SOLN: 5.0000 mg | Freq: Three times a day (TID) | INTRAMUSCULAR | Status: DC | PRN

## 2020-09-22 MED ORDER — MELATONIN 3 MG PO TABS
3.0000 mg | ORAL_TABLET | Freq: Every evening | ORAL | Status: DC | PRN
  Administered 2020-09-22: 3 mg via ORAL
  Filled 2020-09-22: qty 1

## 2020-09-22 MED ORDER — BUPIVACAINE HCL (PF) 0.25 % IJ SOLN
INTRAMUSCULAR | Status: AC
  Filled 2020-09-22: qty 30

## 2020-09-22 MED ORDER — ALUM & MAG HYDROXIDE-SIMETH 200-200-20 MG/5ML PO SUSP: 30.0000 mL | ORAL | Status: DC | PRN

## 2020-09-22 MED ORDER — PRAVASTATIN SODIUM 20 MG PO TABS
20.0000 mg | ORAL_TABLET | Freq: Every day | ORAL | Status: DC
  Administered 2020-09-22: 20 mg via ORAL
  Filled 2020-09-22: qty 1

## 2020-09-22 MED ORDER — ONDANSETRON HCL 4 MG/2ML IJ SOLN
INTRAMUSCULAR | Status: AC
  Filled 2020-09-22: qty 2

## 2020-09-22 MED ORDER — FENTANYL CITRATE (PF) 100 MCG/2ML IJ SOLN
50.0000 ug | INTRAMUSCULAR | Status: DC
  Administered 2020-09-22: 50 ug via INTRAVENOUS
  Filled 2020-09-22: qty 2

## 2020-09-22 MED ORDER — POVIDONE-IODINE 10 % EX SWAB
2.0000 "application " | Freq: Once | CUTANEOUS | Status: AC
  Administered 2020-09-22: 2 via TOPICAL

## 2020-09-22 MED ORDER — MAGNESIUM CITRATE PO SOLN: 1.0000 | Freq: Once | ORAL | Status: DC | PRN

## 2020-09-22 MED ORDER — LEVOTHYROXINE SODIUM 88 MCG PO TABS
88.0000 ug | ORAL_TABLET | Freq: Every day | ORAL | Status: DC
  Administered 2020-09-23: 88 ug via ORAL
  Filled 2020-09-22: qty 1

## 2020-09-22 MED ORDER — ONDANSETRON HCL 4 MG/2ML IJ SOLN: 4.0000 mg | Freq: Four times a day (QID) | INTRAMUSCULAR | Status: DC | PRN

## 2020-09-22 MED ORDER — ACETAMINOPHEN 500 MG PO TABS
500.0000 mg | ORAL_TABLET | Freq: Four times a day (QID) | ORAL | Status: AC
  Administered 2020-09-22 – 2020-09-23 (×4): 500 mg via ORAL
  Filled 2020-09-22 (×4): qty 1

## 2020-09-22 MED ORDER — ACETAMINOPHEN 500 MG PO TABS
1000.0000 mg | ORAL_TABLET | Freq: Once | ORAL | Status: AC
  Administered 2020-09-22: 1000 mg via ORAL
  Filled 2020-09-22: qty 2

## 2020-09-22 MED ORDER — ONDANSETRON HCL 4 MG/2ML IJ SOLN
INTRAMUSCULAR | Status: DC | PRN
  Administered 2020-09-22: 4 mg via INTRAVENOUS

## 2020-09-22 MED ORDER — POTASSIUM CHLORIDE IN NACL 20-0.45 MEQ/L-% IV SOLN
INTRAVENOUS | Status: DC
  Filled 2020-09-22 (×2): qty 1000

## 2020-09-22 MED ORDER — PROPOFOL 10 MG/ML IV BOLUS
INTRAVENOUS | Status: DC | PRN
  Administered 2020-09-22: 30 mg via INTRAVENOUS
  Administered 2020-09-22 (×2): 20 mg via INTRAVENOUS

## 2020-09-22 MED ORDER — LACTATED RINGERS IV SOLN: INTRAVENOUS | Status: DC

## 2020-09-22 MED ORDER — DIPHENHYDRAMINE HCL 12.5 MG/5ML PO ELIX: 12.5000 mg | ORAL_SOLUTION | ORAL | Status: DC | PRN

## 2020-09-22 MED ORDER — PROPOFOL 500 MG/50ML IV EMUL
INTRAVENOUS | Status: AC
  Filled 2020-09-22: qty 50

## 2020-09-22 MED ORDER — ONDANSETRON HCL 4 MG/2ML IJ SOLN: 4.0000 mg | Freq: Once | INTRAMUSCULAR | Status: DC | PRN

## 2020-09-22 MED ORDER — BUPIVACAINE HCL 0.25 % IJ SOLN
INTRAMUSCULAR | Status: DC | PRN
  Administered 2020-09-22: 30 mL

## 2020-09-22 MED ORDER — CHOLECALCIFEROL 10 MCG (400 UNIT) PO TABS
400.0000 [IU] | ORAL_TABLET | Freq: Every day | ORAL | Status: DC
  Administered 2020-09-23: 400 [IU] via ORAL
  Filled 2020-09-22: qty 1

## 2020-09-22 MED ORDER — BUPIVACAINE IN DEXTROSE 0.75-8.25 % IT SOLN
INTRATHECAL | Status: DC | PRN
  Administered 2020-09-22: 1.6 mL via INTRATHECAL

## 2020-09-22 MED ORDER — BUPIVACAINE HCL (PF) 0.5 % IJ SOLN
INTRAMUSCULAR | Status: DC | PRN
  Administered 2020-09-22: 20 mL via PERINEURAL

## 2020-09-22 MED ORDER — METOPROLOL SUCCINATE ER 50 MG PO TB24
50.0000 mg | ORAL_TABLET | Freq: Every day | ORAL | Status: DC
  Administered 2020-09-22: 50 mg via ORAL
  Filled 2020-09-22: qty 1

## 2020-09-22 MED ORDER — CHLORHEXIDINE GLUCONATE 0.12 % MT SOLN
15.0000 mL | Freq: Once | OROMUCOSAL | Status: AC
  Administered 2020-09-22: 15 mL via OROMUCOSAL

## 2020-09-22 MED ORDER — MENTHOL 3 MG MT LOZG: 1.0000 | LOZENGE | OROMUCOSAL | Status: DC | PRN

## 2020-09-22 MED ORDER — HYDROCODONE-ACETAMINOPHEN 5-325 MG PO TABS: 1.0000 | ORAL_TABLET | ORAL | Status: DC | PRN

## 2020-09-22 MED ORDER — TRANEXAMIC ACID-NACL 1000-0.7 MG/100ML-% IV SOLN
1000.0000 mg | INTRAVENOUS | Status: AC
  Administered 2020-09-22: 1000 mg via INTRAVENOUS
  Filled 2020-09-22: qty 100

## 2020-09-22 MED ORDER — ACETAMINOPHEN 325 MG PO TABS: 325.0000 mg | ORAL_TABLET | Freq: Four times a day (QID) | ORAL | Status: DC | PRN

## 2020-09-22 MED ORDER — DEXAMETHASONE SODIUM PHOSPHATE 10 MG/ML IJ SOLN
10.0000 mg | Freq: Once | INTRAMUSCULAR | Status: AC
  Administered 2020-09-23: 10 mg via INTRAVENOUS
  Filled 2020-09-22: qty 1

## 2020-09-22 MED ORDER — KETOROLAC TROMETHAMINE 30 MG/ML IJ SOLN
INTRAMUSCULAR | Status: DC | PRN
  Administered 2020-09-22: 30 mg

## 2020-09-22 MED ORDER — ASPIRIN EC 325 MG PO TBEC
325.0000 mg | DELAYED_RELEASE_TABLET | Freq: Two times a day (BID) | ORAL | Status: DC
  Administered 2020-09-23: 325 mg via ORAL
  Filled 2020-09-22: qty 1

## 2020-09-22 MED ORDER — CEFAZOLIN SODIUM-DEXTROSE 2-4 GM/100ML-% IV SOLN
2.0000 g | Freq: Four times a day (QID) | INTRAVENOUS | Status: AC
  Administered 2020-09-22 – 2020-09-23 (×2): 2 g via INTRAVENOUS
  Filled 2020-09-22 (×2): qty 100

## 2020-09-22 SURGICAL SUPPLY — 66 items
BAG SPEC THK2 15X12 ZIP CLS (MISCELLANEOUS) ×1
BAG ZIPLOCK 12X15 (MISCELLANEOUS) ×2 IMPLANT
BANDAGE ESMARK 6X9 LF (GAUZE/BANDAGES/DRESSINGS) ×1 IMPLANT
BEARING TIBIAL STRL SZ3 LG 3 (Knees) ×2 IMPLANT
BLADE SURG 15 STRL LF DISP TIS (BLADE) ×1 IMPLANT
BLADE SURG 15 STRL SS (BLADE) ×2
BNDG CMPR 9X6 STRL LF SNTH (GAUZE/BANDAGES/DRESSINGS) ×1
BNDG CMPR MED 15X6 ELC VLCR LF (GAUZE/BANDAGES/DRESSINGS) ×1
BNDG ELASTIC 6X15 VLCR STRL LF (GAUZE/BANDAGES/DRESSINGS) ×2 IMPLANT
BNDG ESMARK 6X9 LF (GAUZE/BANDAGES/DRESSINGS) ×2
BOWL SMART MIX CTS (DISPOSABLE) ×2 IMPLANT
BRNG TIB LRG 3 PHS 3 UNCMP (Knees) ×1 IMPLANT
CEMENT BONE R 1X40 (Cement) ×2 IMPLANT
CLSR STERI-STRIP ANTIMIC 1/2X4 (GAUZE/BANDAGES/DRESSINGS) ×2 IMPLANT
COVER SURGICAL LIGHT HANDLE (MISCELLANEOUS) ×2 IMPLANT
COVER WAND RF STERILE (DRAPES) IMPLANT
CUFF TOURN SGL QUICK 34 (TOURNIQUET CUFF) ×2
CUFF TRNQT CYL 34X4.125X (TOURNIQUET CUFF) ×1 IMPLANT
DECANTER SPIKE VIAL GLASS SM (MISCELLANEOUS) IMPLANT
DRAPE EXTREMITY T 121X128X90 (DISPOSABLE) ×2 IMPLANT
DRAPE POUCH INSTRU U-SHP 10X18 (DRAPES) ×2 IMPLANT
DRAPE SHEET LG 3/4 BI-LAMINATE (DRAPES) ×2 IMPLANT
DRAPE U-SHAPE 47X51 STRL (DRAPES) ×2 IMPLANT
DRSG MEPILEX BORDER 4X8 (GAUZE/BANDAGES/DRESSINGS) ×2 IMPLANT
DRSG PAD ABDOMINAL 8X10 ST (GAUZE/BANDAGES/DRESSINGS) ×2 IMPLANT
DURAPREP 26ML APPLICATOR (WOUND CARE) ×4 IMPLANT
ELECT REM PT RETURN 15FT ADLT (MISCELLANEOUS) ×2 IMPLANT
FACESHIELD WRAPAROUND (MASK) ×2 IMPLANT
GLOVE BIO SURGEON STRL SZ7 (GLOVE) ×2 IMPLANT
GLOVE BIOGEL PI IND STRL 7.0 (GLOVE) ×1 IMPLANT
GLOVE BIOGEL PI IND STRL 8 (GLOVE) ×1 IMPLANT
GLOVE BIOGEL PI INDICATOR 7.0 (GLOVE) ×1
GLOVE BIOGEL PI INDICATOR 8 (GLOVE) ×1
GLOVE SURG SS PI 7.5 STRL IVOR (GLOVE) ×2 IMPLANT
GOWN STRL REUS W/TWL LRG LVL3 (GOWN DISPOSABLE) ×4 IMPLANT
HANDPIECE INTERPULSE COAX TIP (DISPOSABLE) ×2
HOLDER FOLEY CATH W/STRAP (MISCELLANEOUS) IMPLANT
HOOD PEEL AWAY FLYTE STAYCOOL (MISCELLANEOUS) ×4 IMPLANT
IMMOBILIZER KNEE 20 (SOFTGOODS)
IMMOBILIZER KNEE 20 THIGH 36 (SOFTGOODS) IMPLANT
IMMOBILIZER KNEE 22 UNIV (SOFTGOODS) ×2 IMPLANT
KIT BASIN OR (CUSTOM PROCEDURE TRAY) ×2 IMPLANT
KIT TURNOVER KIT A (KITS) IMPLANT
KNEE UNICOMP MEDIAL OXFORD RT (Joint) ×2 IMPLANT
NDL SAFETY ECLIPSE 18X1.5 (NEEDLE) ×1 IMPLANT
NEEDLE HYPO 18GX1.5 SHARP (NEEDLE) ×2
NS IRRIG 1000ML POUR BTL (IV SOLUTION) ×2 IMPLANT
PACK BLADE SAW RECIP 70 3 PT (BLADE) ×2 IMPLANT
PACK ICE MAXI GEL EZY WRAP (MISCELLANEOUS) ×2 IMPLANT
PACK TOTAL JOINT (CUSTOM PROCEDURE TRAY) ×2 IMPLANT
PEG FEMORAL CEMENT STRL LRG (Knees) ×2 IMPLANT
PENCIL SMOKE EVACUATOR (MISCELLANEOUS) IMPLANT
PROTECTOR NERVE ULNAR (MISCELLANEOUS) ×2 IMPLANT
SET HNDPC FAN SPRY TIP SCT (DISPOSABLE) ×1 IMPLANT
SUCTION FRAZIER HANDLE 12FR (TUBING) ×1
SUCTION TUBE FRAZIER 12FR DISP (TUBING) ×1 IMPLANT
SUT VIC AB 1 CT1 36 (SUTURE) ×2 IMPLANT
SUT VIC AB 2-0 CT1 27 (SUTURE) ×2
SUT VIC AB 2-0 CT1 TAPERPNT 27 (SUTURE) ×1 IMPLANT
SUT VIC AB 3-0 SH 8-18 (SUTURE) ×2 IMPLANT
SYR 30ML LL (SYRINGE) ×2 IMPLANT
SYR 3ML LL SCALE MARK (SYRINGE) ×2 IMPLANT
TOWEL OR 17X26 10 PK STRL BLUE (TOWEL DISPOSABLE) ×2 IMPLANT
TOWEL OR NON WOVEN STRL DISP B (DISPOSABLE) ×2 IMPLANT
TRAY FOLEY MTR SLVR 16FR STAT (SET/KITS/TRAYS/PACK) ×2 IMPLANT
WATER STERILE IRR 1000ML POUR (IV SOLUTION) ×2 IMPLANT

## 2020-09-22 NOTE — Anesthesia Procedure Notes (Signed)
Date/Time: 09/22/2020 12:57 PM Performed by: Sharlette Dense, CRNA Oxygen Delivery Method: Simple face mask

## 2020-09-22 NOTE — Progress Notes (Signed)
PT Cancellation Note  Patient Details Name: Jay Bradshaw MRN: [id number] DOB: 24-Dec-1934   Cancelled Treatment:    Reason Eval/Treat Not Completed: Other (comment);Patient not medically ready (Pt's spinal block has not worn off and pt is unable to dorsi/plantarflex and continues to have parasthesia in LE's. Will follow up at later date/time as schedule allows and pt able.)   Gwynneth Albright PT, [place]  Office [phone] Pager [phone]  09/22/2020 5:27 PM

## 2020-09-22 NOTE — Transfer of Care (Signed)
Immediate Anesthesia Transfer of Care Note  Patient: Jay Bradshaw  Procedure(s) Performed: UNICOMPARTMENTAL KNEE (Right Knee)  Patient Location: PACU  Anesthesia Type:Spinal  Level of Consciousness: drowsy  Airway & Oxygen Therapy: Patient Spontanous Breathing and Patient connected to face mask oxygen  Post-op Assessment: Report given to RN and Post -op Vital signs reviewed and stable  Post vital signs: Reviewed and stable  Last Vitals:  Vitals Value Taken Time  BP    Temp    Pulse 66 09/22/20 1539  Resp 16 09/22/20 1539  SpO2 100 % 09/22/20 1539  Vitals shown include unvalidated device data.  Last Pain:  Vitals:   09/22/20 0943  TempSrc: Oral         Complications: No complications documented.

## 2020-09-22 NOTE — Discharge Instructions (Signed)
INSTRUCTIONS AFTER JOINT REPLACEMENT   o Remove items at home which could result in a fall. This includes throw rugs or furniture in walking pathways o ICE to the affected joint every three hours while awake for 30 minutes at a time, for at least the first 3-5 days, and then as needed for pain and swelling.  Continue to use ice for pain and swelling. You may notice swelling that will progress down to the foot and ankle.  This is normal after surgery.  Elevate your leg when you are not up walking on it.   o Continue to use the breathing machine you got in the hospital (incentive spirometer) which will help keep your temperature down.  It is common for your temperature to cycle up and down following surgery, especially at night when you are not up moving around and exerting yourself.  The breathing machine keeps your lungs expanded and your temperature down.   DIET:  As you were doing prior to hospitalization, we recommend a well-balanced diet.  DRESSING / WOUND CARE / SHOWERING  You may change your dressing 3-5 days after surgery.  Then change the dressing every day with sterile gauze.  Please use good hand washing techniques before changing the dressing.  Do not use any lotions or creams on the incision until instructed by your surgeon.  ACTIVITY  o Increase activity slowly as tolerated, but follow the weight bearing instructions below.   o No driving for 6 weeks or until further direction given by your physician.  You cannot drive while taking narcotics.  o No lifting or carrying greater than 10 lbs. until further directed by your surgeon. o Avoid periods of inactivity such as sitting longer than an hour when not asleep. This helps prevent blood clots.  o You may return to work once you are authorized by your doctor.     WEIGHT BEARING   Weight bearing as tolerated with assist device (walker, cane, etc) as directed, use it as long as suggested by your surgeon or therapist, typically at  least 4-6 weeks.   EXERCISES  Results after joint replacement surgery are often greatly improved when you follow the exercise, range of motion and muscle strengthening exercises prescribed by your doctor. Safety measures are also important to protect the joint from further injury. Any time any of these exercises cause you to have increased pain or swelling, decrease what you are doing until you are comfortable again and then slowly increase them. If you have problems or questions, call your caregiver or physical therapist for advice.   Rehabilitation is important following a joint replacement. After just a few days of immobilization, the muscles of the leg can become weakened and shrink (atrophy).  These exercises are designed to build up the tone and strength of the thigh and leg muscles and to improve motion. Often times heat used for twenty to thirty minutes before working out will loosen up your tissues and help with improving the range of motion but do not use heat for the first two weeks following surgery (sometimes heat can increase post-operative swelling).   These exercises can be done on a training (exercise) mat, on the floor, on a table or on a bed. Use whatever works the best and is most comfortable for you.    Use music or television while you are exercising so that the exercises are a pleasant break in your day. This will make your life better with the exercises acting as a break   in your routine that you can look forward to.   Perform all exercises about fifteen times, three times per day or as directed.  You should exercise both the operative leg and the other leg as well.  Exercises include:   . Quad Sets - Tighten up the muscle on the front of the thigh (Quad) and hold for 5-10 seconds.   . Straight Leg Raises - With your knee straight (if you were given a brace, keep it on), lift the leg to 60 degrees, hold for 3 seconds, and slowly lower the leg.  Perform this exercise against  resistance later as your leg gets stronger.  . Leg Slides: Lying on your back, slowly slide your foot toward your buttocks, bending your knee up off the floor (only go as far as is comfortable). Then slowly slide your foot back down until your leg is flat on the floor again.  . Angel Wings: Lying on your back spread your legs to the side as far apart as you can without causing discomfort.  . Hamstring Strength:  Lying on your back, push your heel against the floor with your leg straight by tightening up the muscles of your buttocks.  Repeat, but this time bend your knee to a comfortable angle, and push your heel against the floor.  You may put a pillow under the heel to make it more comfortable if necessary.   A rehabilitation program following joint replacement surgery can speed recovery and prevent re-injury in the future due to weakened muscles. Contact your doctor or a physical therapist for more information on knee rehabilitation.    CONSTIPATION  Constipation is defined medically as fewer than three stools per week and severe constipation as less than one stool per week.  Even if you have a regular bowel pattern at home, your normal regimen is likely to be disrupted due to multiple reasons following surgery.  Combination of anesthesia, postoperative narcotics, change in appetite and fluid intake all can affect your bowels.   YOU MUST use at least one of the following options; they are listed in order of increasing strength to get the job done.  They are all available over the counter, and you may need to use some, POSSIBLY even all of these options:    Drink plenty of fluids (prune juice may be helpful) and high fiber foods Colace 100 mg by mouth twice a day  Senokot for constipation as directed and as needed Dulcolax (bisacodyl), take with full glass of water  Miralax (polyethylene glycol) once or twice a day as needed.  If you have tried all these things and are unable to have a bowel  movement in the first 3-4 days after surgery call either your surgeon or your primary doctor.    If you experience loose stools or diarrhea, hold the medications until you stool forms back up.  If your symptoms do not get better within 1 week or if they get worse, check with your doctor.  If you experience "the worst abdominal pain ever" or develop nausea or vomiting, please contact the office immediately for further recommendations for treatment.   ITCHING:  If you experience itching with your medications, try taking only a single pain pill, or even half a pain pill at a time.  You can also use Benadryl over the counter for itching or also to help with sleep.   TED HOSE STOCKINGS:  Use stockings on both legs until for at least 2 weeks or as   directed by physician office. They may be removed at night for sleeping. ° °MEDICATIONS:  See your medication summary on the “After Visit Summary” that nursing will review with you.  You may have some home medications which will be placed on hold until you complete the course of blood thinner medication.  It is important for you to complete the blood thinner medication as prescribed. ° °PRECAUTIONS:  If you experience chest pain or shortness of breath - call 911 immediately for transfer to the hospital emergency department.  ° °If you develop a fever greater that 101 F, purulent drainage from wound, increased redness or drainage from wound, foul odor from the wound/dressing, or calf pain - CONTACT YOUR SURGEON.   °                                                °FOLLOW-UP APPOINTMENTS:  If you do not already have a post-op appointment, please call the office for an appointment to be seen by your surgeon.  Guidelines for how soon to be seen are listed in your “After Visit Summary”, but are typically between 1-4 weeks after surgery. ° °OTHER INSTRUCTIONS:  ° °Knee Replacement:  Do not place pillow under knee, focus on keeping the knee straight while resting.  ° °DENTAL  ANTIBIOTICS: ° °In most cases prophylactic antibiotics for Dental procdeures after total joint surgery are not necessary. ° °Exceptions are as follows: ° °1. History of prior total joint infection ° °2. Severely immunocompromised (Organ Transplant, cancer chemotherapy, Rheumatoid biologic °meds such as Humera) ° °3. Poorly controlled diabetes (A1C &gt; 8.0, blood glucose over 200) ° °If you have one of these conditions, contact your surgeon for an antibiotic prescription, prior to your °dental procedure. ° ° °MAKE SURE YOU:  °• Understand these instructions.  °• Get help right away if you are not doing well or get worse.  ° ° °Thank you for letting us be a part of your medical care team.  It is a privilege we respect greatly.  We hope these instructions will help you stay on track for a fast and full recovery!  ° °

## 2020-09-22 NOTE — H&P (Signed)
PREOPERATIVE H&P  Chief Complaint: right knee pain  HPI: Jay Bradshaw is a 85 y.o. male who presents for preoperative history and physical with a diagnosis of severe right anteromedial knee osteoarthritis. Symptoms are rated as moderate to severe, and have been worsening. This is significantly impairing activities of daily living. He has not been interested in trying injections, anti-inflammatories, or bracing but has elected for surgical management.  Past Medical History:  Diagnosis Date  . Anxiety   . Arthritis   . Balance problem    more noticed in the dark  . Complication of anesthesia    opioids causes urine retention  . Constipation    takes Colace daily as needed  . COPD (chronic obstructive pulmonary disease) ([place])   . Coronary artery disease    takes Metoprolol daily, Prox RCA lesion, 20 %stenosed, Mid RCA lesion 20% stenosed, Dist LAD lesion 20% stenosed EF greater than 65%,, 1st mrg lesion 60% stenosed, mild non-obstructive disease in LAD and RCA, Stenosis of OM Branch appears moderate  . Emphysema lung ([place])   . Enlarged prostate    takes Tamsulosin daily  . History of echocardiogram 09/08/2016   EF 14-97% grade 2 diastolic dysfunction mild RAE Reviewed  . Hyperlipidemia    takes Lovastatin daily  . Hypertension    elevated BP but not on meds  . Hypothyroidism    takes Synthroid daily  . Joint pain   . Osteoarthritis of left shoulder 09/29/2015  . Osteoarthritis of right shoulder, primary 12/16/2014  . PONV (postoperative nausea and vomiting)   . Productive cough   . Seasonal allergies   . Shortness of breath dyspnea   . Tremors of nervous system    essential tremors  . Urinary frequency    takes Ditropan daily   Past Surgical History:  Procedure Laterality Date  . CARDIAC CATHETERIZATION N/A 09/25/2015   Procedure: Left Heart Cath and Coronary Angiography;  Surgeon: Burnell Blanks, MD;  Location: [facility];  Service: Cardiovascular;   Laterality: N/A;  . CARDIAC CATHETERIZATION N/A 08/29/2016   Procedure: Left Heart Cath and Coronary Angiography;  Surgeon: Burnell Blanks, MD;  Location: [facility];  Service: Cardiovascular;  Laterality: N/A;  . CATARACT EXTRACTION  Aug and Sep 2011   first left eye, then right eye  . COLONOSCOPY  June 2008   Dr. Lurena Joiner ref by Ruthann Cancer  . CYSTOSCOPY WITH INSERTION OF UROLIFT N/A 11/09/2017   Procedure: CYSTOSCOPY WITH INSERTION OF UROLIFT;  Surgeon: Franchot Gallo, MD;  Location: [facility]  Service: Urology;  Laterality: N/A;  . excision of fibroma  October 1994   in skin on back of neck right of midline, Dede Query ref by Lidia Collum  . HERNIA REPAIR    . IRRIGATION AND DEBRIDEMENT SEBACEOUS CYST  2010   from the back, [facility]  . LIPOMA EXCISION  Nov 2004   from neck, right side (near location of mandible cyst), Dr Lorrine Kin ref by [place]  . PROSTATE BIOPSY  2006   Dr. Leia Alf  . SHOULDER HEMI-ARTHROPLASTY Right 12/16/2014   Procedure: SHOULDER HEMI-ARTHROPLASTY;  Surgeon: Johnny Bridge, MD;  Location: [facility];  Service: Orthopedics;  Laterality: Right;  . SHOULDER SURGERY Right 12/16/2014   hemi-arthroplasty    dr Mardelle Matte  . thyroid adenoma removal  August 1970   right side, [facility]  . TOTAL SHOULDER ARTHROPLASTY Right 12/16/2014   Procedure: RIGHT TOTAL SHOULDER ARTHROPLASTY;  Surgeon: Vonna Kotyk  Llana Aliment, MD;  Location: [facility];  Service: Orthopedics;  Laterality: Right;  . TOTAL SHOULDER ARTHROPLASTY Left 09/29/2015  . TOTAL SHOULDER ARTHROPLASTY Left 09/29/2015   Procedure: TOTAL LEFT SHOULDER ARTHROPLASTY;  Surgeon: Marchia Bond, MD;  Location: [facility];  Service: Orthopedics;  Laterality: Left;  . vacuolar cyst removal     in right mandible fixed/filled, Denyce Robert ref by Synetta Shadow, DDS  . wisdom teeth extracted     Social History   Socioeconomic History  . Marital status: Married    Spouse name: Not on  file  . Number of children: Not on file  . Years of education: Not on file  . Highest education level: Not on file  Occupational History  . Occupation: retired    Comment: Adult nurse [employer]  Tobacco Use  . Smoking status: Former Smoker    Quit date: 11/02/1971    Years since quitting: 48.9  . Smokeless tobacco: Never Used  Vaping Use  . Vaping Use: Never used  Substance and Sexual Activity  . Alcohol use: Yes    Comment: 3 drinks weekly  . Drug use: No  . Sexual activity: Never  Other Topics Concern  . Not on file  Social History Narrative  . Not on file   Social Determinants of Health   Financial Resource Strain:   . Difficulty of Paying Living Expenses: Not on file  Food Insecurity:   . Worried About Charity fundraiser in the Last Year: Not on file  . Ran Out of Food in the Last Year: Not on file  Transportation Needs:   . Lack of Transportation (Medical): Not on file  . Lack of Transportation (Non-Medical): Not on file  Physical Activity:   . Days of Exercise per Week: Not on file  . Minutes of Exercise per Session: Not on file  Stress:   . Feeling of Stress : Not on file  Social Connections:   . Frequency of Communication with Friends and Family: Not on file  . Frequency of Social Gatherings with Friends and Family: Not on file  . Attends Religious Services: Not on file  . Active Member of Clubs or Organizations: Not on file  . Attends Archivist Meetings: Not on file  . Marital Status: Not on file   Family History  Problem Relation Age of Onset  . Hypertension Mother   . Breast cancer Mother   . Tremor Father   . Prostate cancer Brother   . Tremor Brother   . Tremor Daughter    Allergies  Allergen Reactions  . Oxycodone Other (See Comments)    Difficulty urinating for 24 plus hours   Prior to Admission medications   Medication Sig Start Date End Date Taking? Authorizing Provider  Acetaminophen 500 MG capsule Take  250-500 mg by mouth every 6 (six) hours as needed for mild pain or moderate pain.    Yes [provider]  aspirin 81 MG tablet Take 81 mg by mouth daily.   Yes [provider]  beta carotene w/minerals (OCUVITE) tablet Take 1 tablet by mouth every other day. Alternate with multivitamin   Yes [provider]  bisacodyl (DULCOLAX) 5 MG EC tablet Take 5 mg by mouth daily as needed for moderate constipation.   Yes [provider]  CALCIUM-MAGNESIUM PO Take 500-800 mg by mouth 2 (two) times daily.    Yes [provider]  cholecalciferol (VITAMIN D) 400 UNITS TABS tablet Take 400 Units  by mouth 2 (two) times daily.   Yes [provider]  Coenzyme Q10 (CO Q10) 100 MG CAPS Take 200 mg by mouth daily.   Yes [provider]  hydrocortisone cream (PREPARATION H) 1 % Apply 1 application topically daily as needed for itching.   Yes [provider]  levothyroxine (SYNTHROID, LEVOTHROID) 88 MCG tablet Take 88 mcg by mouth daily before breakfast.   Yes [provider]  lovastatin (MEVACOR) 20 MG tablet Take 20 mg by mouth daily.    Yes [provider]  Melatonin 3 MG TABS Take 3 mg by mouth at bedtime as needed (sleep).    Yes [provider]  metoprolol succinate (TOPROL-XL) 50 MG 24 hr tablet Take 50 mg by mouth at bedtime.    Yes [provider]  Multiple Vitamin (MULTIVITAMIN) capsule Take 1 capsule by mouth every other day. Alternate with Ocuvite   Yes [provider]  nitroGLYCERIN (NITROSTAT) 0.4 MG SL tablet Place 1 tablet (0.4 mg total) under the tongue every 5 (five) minutes as needed for chest pain (up to 3 doses MAX). 08/17/17  Yes Josue Hector, MD  Potassium 99 MG TABS Take 99 mg by mouth at bedtime.    Yes [provider]  PSYLLIUM HUSK PO Take 1 capsule by mouth 3 (three) times daily as needed (depending on meals he ate that day). Gel caps   Yes [provider]  saw  palmetto 160 MG capsule Take 160 mg by mouth 3 (three) times daily.   Yes [provider]  tamsulosin (FLOMAX) 0.4 MG CAPS capsule Take 0.4 mg by mouth See admin instructions. Monday midnight, Thursday morning at 8am and Saturday at 1600   Yes [provider]  vitamin C (ASCORBIC ACID) 500 MG tablet Take 500 mg by mouth every other day. Alternate with vitamin E   Yes [provider]  VITAMIN E BLEND PO Take 300 Units by mouth every other day. Alternate with vitamin C   Yes [provider]     Positive ROS: All other systems have been reviewed and were otherwise negative with the exception of those mentioned in the HPI and as above.  Physical Exam: General: Alert, no acute distress Cardiovascular: No pedal edema, normal rate and rhythm Respiratory: No cyanosis, no use of accessory musculature. GI: No organomegaly, abdomen is soft and non-tender, normal bowel sounds Skin: No lesions noted over right knee. Neurologic: Sensation intact distally Psychiatric: Patient is competent for consent with normal mood and affect Lymphatic: No axillary or cervical lymphadenopathy  MUSCULOSKELETAL: 3-125 degrees of motion at right knee. Clunk with valgus testing. Minimal patellar grind.   Imaging: X-rays show severe anteromedial knee osteoarthritis  Assessment: Severe anteromedial knee osteoarthritis  Plan: Plan for Procedure(s): UNICOMPARTMENTAL KNEE  The risks benefits and alternatives were discussed with the patient including but not limited to the risks of nonoperative treatment, versus surgical intervention including infection, bleeding, nerve injury,  blood clots, cardiopulmonary complications, morbidity, mortality, among others, and they were willing to proceed.   Ventura Bruns, PA-C    09/22/2020 6:53 AM

## 2020-09-22 NOTE — Op Note (Signed)
09/22/2020  3:05 PM  PATIENT:  Jay Bradshaw    PRE-OPERATIVE DIAGNOSIS: Right knee anteromedial osteoarthritis  POST-OPERATIVE DIAGNOSIS:  Same  PROCEDURE: RIGHT unicompartmental Knee Arthroplasty  SURGEON:  Johnny Bridge, MD  PHYSICIAN ASSISTANT: Merlene Pulling, PA-C, present and scrubbed throughout the case, critical for completion in a timely fashion, and for retraction, instrumentation, and closure.  ANESTHESIA:   Spinal with a Foley  ESTIMATED BLOOD LOSS: 100 mL  UNIQUE ASPECTS OF THE CASE: The ACL and lateral compartment and patellofemoral joint were in good condition.  The medial side was eburnated and had a substantial ridge of bone.  Access to the knee was tight throughout the case, I was between a D and E on the tibial tray, but felt that the D fit better front to back.  I thought that he was a tight 3 throughout the case, but ultimately the 3 felt like it balanced well.  PREOPERATIVE INDICATIONS:  Jay Bradshaw is a  85 y.o. male with a diagnosis of right knee OA who failed conservative measures and elected for surgical management.    The risks benefits and alternatives were discussed with the patient preoperatively including but not limited to the risks of infection, bleeding, nerve injury, cardiopulmonary complications, blood clots, the need for revision surgery, among others, and the patient was willing to proceed.  OPERATIVE IMPLANTS: Biomet Oxford mobile bearing medial compartment arthroplasty femur size large, tibia size D, bearing size 3.  OPERATIVE FINDINGS: Endstage grade 4 medial compartment osteoarthritis. No significant changes in the lateral or patellofemoral joint.  The ACL was intact.  OPERATIVE PROCEDURE: The patient was brought to the operating room placed in the supine position. Anesthesia was administered. IV antibiotics were given. The lower extremity was placed in the legholder and prepped and draped in usual sterile fashion.  Time out was  performed.  The leg was elevated and exsanguinated and the tourniquet was inflated. Anteromedial incision was performed, and I took care to preserve the MCL. Parapatellar incision was carried out, and the osteophytes were excised, along with the medial meniscus and a small portion of the fat pad.  The extra medullary tibial cutting jig was applied, using the spoon and the 60mm G-Clamp and the 2 mm shim, and I took care to protect the anterior cruciate ligament insertion and the tibial spine. The medial collateral ligament was also protected, and I resected my proximal tibia, matching the anatomic slope.   The proximal tibial bony cut was removed in one piece, and I turned my attention to the femur.  This was a thin cut.  The intramedullary femoral rod was placed using the drill, and then using the appropriate reference, I assembled the femoral jig, setting my posterior cutting block. I resected my posterior femur, used the 0 spigot for the anterior femur, and then measured my gap.   I then used the appropriate mill to match the extension gap to the flexion gap. The second milling was at a 3 and then a 4.  The gaps were then measured again with the appropriate feeler gauges. Once I had balanced flexion and extension gaps, I then completed the preparation of the femur.  I milled off the anterior aspect of the distal femur to prevent impingement. I also exposed the tibia, and selected the above-named component, and then used the cutting jig to prepare the keel slot on the tibia. I also used the awl to curette out the bone to complete the preparation of the keel. The back  wall was intact.  I then placed trial components, and it was found to have excellent motion, and appropriate balance.  I then cemented the components into place, cementing the tibia first, removing all excess cement, and then cementing the femur.  All loose cement was removed.  The real polyethylene insert was applied manually, and the  knee was taken through functional range of motion, and found to have excellent stability and restoration of joint motion, with excellent balance.  The wounds were irrigated copiously, and the parapatellar tissue closed with Vicryl, followed by Vicryl for the subcutaneous tissue, with routine closure with Steri-Strips and sterile gauze.  The tourniquet was released, and the patient was awakened and extubated and returned to PACU in stable and satisfactory condition. There were no complications.

## 2020-09-22 NOTE — Progress Notes (Signed)
AssistedDr. Elgie Congo with right, ultrasound guided, adductor canal block. Side rails up, monitors on throughout procedure. See vital signs in flow sheet. Tolerated Procedure well.

## 2020-09-22 NOTE — Care Plan (Signed)
Update on DCP - patient had a unicompartmental replacement and will go to OPPT at [facility] in [place]. HHPT referral has been cancelled   Ladell Heads, [place]

## 2020-09-22 NOTE — Anesthesia Procedure Notes (Signed)
Anesthesia Regional Block: Adductor canal block   Pre-Anesthetic Checklist: ,, timeout performed, Correct Patient, Correct Site, Correct Laterality, Correct Procedure, Correct Position, site marked, Risks and benefits discussed,  Surgical consent,  Pre-op evaluation,  At surgeon's request and post-op pain management  Laterality: Right  Prep: Dura Prep       Needles:  Injection technique: Single-shot  Needle Type: Echogenic Stimulator Needle     Needle Length: 10cm  Needle Gauge: 20     Additional Needles:   Procedures:,,,, ultrasound used (permanent image in chart),,,,  Narrative:  Start time: 09/22/2020 10:30 AM End time: 09/22/2020 10:35 AM Injection made incrementally with aspirations every 5 mL.  Performed by: Personally  Anesthesiologist: Merlinda Frederick, MD  Additional Notes: Functioning IV was confirmed and monitors were applied. Sterile prep and drape,hand hygiene and sterile gloves were used. Ultrasound guidance: relevant anatomy identified, needle position confirmed, local anesthetic spread visualized around nerve(s)., vascular puncture avoided.  Image printed for medical record. Negative aspiration and negative test dose prior to incremental administration of local anesthetic. The patient tolerated the procedure well.

## 2020-09-22 NOTE — Anesthesia Postprocedure Evaluation (Signed)
Anesthesia Post Note  Patient: Jay Bradshaw  Procedure(s) Performed: UNICOMPARTMENTAL KNEE (Right Knee)     Patient location during evaluation: Nursing Unit Anesthesia Type: Regional Level of consciousness: oriented and awake and alert Pain management: pain level controlled Vital Signs Assessment: post-procedure vital signs reviewed and stable Respiratory status: spontaneous breathing and respiratory function stable Cardiovascular status: blood pressure returned to baseline and stable Postop Assessment: no headache, no backache, no apparent nausea or vomiting and patient able to bend at knees Anesthetic complications: no   No complications documented.  Last Vitals:  Vitals:   09/22/20 1829 09/22/20 1956  BP: 137/76 (!) 127/58  Pulse: 70 75  Resp: 14 18  Temp: 36.4 C (!) 36.4 C  SpO2: 99% 97%    Last Pain:  Vitals:   09/22/20 1956  TempSrc: Oral  PainSc:                  Barnet Glasgow

## 2020-09-22 NOTE — Anesthesia Procedure Notes (Signed)
Spinal  Patient location during procedure: OR Start time: 09/22/2020 1:00 PM End time: 09/22/2020 1:05 PM Staffing Performed: anesthesiologist  Anesthesiologist: Merlinda Frederick, MD Preanesthetic Checklist Completed: patient identified, IV checked, risks and benefits discussed, surgical consent, monitors and equipment checked, pre-op evaluation and timeout performed Spinal Block Patient position: sitting Prep: DuraPrep Patient monitoring: cardiac monitor, continuous pulse ox and blood pressure Approach: midline Location: L3-4 Injection technique: single-shot Needle Needle type: Pencan  Needle gauge: 24 G Needle length: 9 cm Additional Notes Functioning IV was confirmed and monitors were applied. Sterile prep and drape, including hand hygiene and sterile gloves were used. The patient was positioned and the spine was prepped. The skin was anesthetized with lidocaine.  Free flow of clear CSF was obtained prior to injecting local anesthetic into the CSF.  The spinal needle aspirated freely following injection.  The needle was carefully withdrawn.  The patient tolerated the procedure well.

## 2020-09-23 ENCOUNTER — Encounter (HOSPITAL_COMMUNITY): Payer: Self-pay | Admitting: Orthopedic Surgery

## 2020-09-23 DIAGNOSIS — M1711 Unilateral primary osteoarthritis, right knee: Secondary | ICD-10-CM | POA: Diagnosis not present

## 2020-09-23 LAB — BASIC METABOLIC PANEL
Anion gap: 6 (ref 5–15)
BUN: 23 mg/dL (ref 8–23)
CO2: 23 mmol/L (ref 22–32)
Calcium: 8.3 mg/dL — ABNORMAL LOW (ref 8.9–10.3)
Chloride: 106 mmol/L (ref 98–111)
Creatinine, Ser: 0.91 mg/dL (ref 0.61–1.24)
GFR, Estimated: >60 mL/min (ref >60)
Glucose, Bld: 154 mg/dL — ABNORMAL HIGH (ref 70–99)
Potassium: 4.4 mmol/L (ref 3.5–5.1)
Sodium: 135 mmol/L (ref 135–145)

## 2020-09-23 LAB — CBC
HCT: 38.6 % — ABNORMAL LOW (ref 39.0–52.0)
Hemoglobin: 12.5 g/dL — ABNORMAL LOW (ref 13.0–17.0)
MCH: 30.4 pg (ref 26.0–34.0)
MCHC: 32.4 g/dL (ref 30.0–36.0)
MCV: 93.9 fL (ref 80.0–100.0)
Platelets: 226 10*3/uL (ref 150–400)
RBC: 4.11 MIL/uL — ABNORMAL LOW (ref 4.22–5.81)
RDW: 13.5 % (ref 11.5–15.5)
WBC: 12.4 10*3/uL — ABNORMAL HIGH (ref 4.0–10.5)
nRBC: 0 % (ref 0.0–0.2)

## 2020-09-23 MED ORDER — ONDANSETRON HCL 4 MG PO TABS: 4.0000 mg | ORAL_TABLET | Freq: Three times a day (TID) | ORAL | 0 refills | Status: DC | PRN

## 2020-09-23 MED ORDER — ASPIRIN EC 325 MG PO TBEC: 325.0000 mg | DELAYED_RELEASE_TABLET | Freq: Two times a day (BID) | ORAL | 0 refills | Status: DC

## 2020-09-23 MED ORDER — BACLOFEN 10 MG PO TABS: 10.0000 mg | ORAL_TABLET | Freq: Three times a day (TID) | ORAL | 0 refills | Status: DC

## 2020-09-23 MED ORDER — HYDROCODONE-ACETAMINOPHEN 5-325 MG PO TABS
1.0000 | ORAL_TABLET | Freq: Four times a day (QID) | ORAL | 0 refills | Status: DC | PRN
Start: 2020-09-23 — End: 2020-12-20

## 2020-09-23 MED ORDER — CHLORHEXIDINE GLUCONATE CLOTH 2 % EX PADS: 6.0000 | MEDICATED_PAD | Freq: Every day | CUTANEOUS | Status: DC

## 2020-09-23 NOTE — TOC Transition Note (Signed)
Transition of Care [facility]) - CM/SW Discharge Note   Patient Details  Name: Denver Bentson MRN: [id number] Date of Birth: Jul 18, 1935  [facility] CM/SW Contact:  Lennart Pall, LCSW Phone Number: 09/23/2020, 11:41 AM   Clinical Narrative:    Met briefly with pt who confirms has needed DME and no HH follow up per surgeon office.  No TOC needs.   Final next level of care: Home/Self Care Barriers to Discharge: No Barriers Identified   Patient Goals and CMS Choice Patient states their goals for this hospitalization and ongoing recovery are:: return home      Discharge Placement                       Discharge Plan and Services                DME Arranged: N/A DME Agency: NA       HH Arranged: NA HH Agency: NA        Social Determinants of Health (SDOH) Interventions     Readmission Risk Interventions No flowsheet data found.

## 2020-09-23 NOTE — Evaluation (Signed)
Physical Therapy Evaluation Patient Details Name: Jay Bradshaw MRN: [id number] DOB: [birth year] Today's Date: 09/23/2020   History of Present Illness  85 yo male s/p R unicompartmental knee replacement 09/22/20. Hx of bil TSA 2021, OA, COPD, neuropathy  Clinical Impression  On eval, pt required Min guard-Min assist for mobility. He walked ~75 feet with use of a RW. Moderate pain with activity. Pt c/o mild lightheadedness but he was able to participate well. Will plan to have a 2nd session prior to possible d/c home later today if all continues to go well.     Follow Up Recommendations Follow surgeon's recommendation for DC plan and follow-up therapies    Equipment Recommendations  None recommended by PT    Recommendations for Other Services       Precautions / Restrictions Precautions Precautions: Fall Restrictions Weight Bearing Restrictions: No      Mobility  Bed Mobility Overal bed mobility: Modified Independent       Supine to sit: HOB elevated          Transfers Overall transfer level: Needs assistance Equipment used: Rolling walker (2 wheeled) Transfers: Sit to/from Stand Sit to Stand: Min guard;From elevated surface         General transfer comment: VCs safety, technique, hand placement. Increased time. Pt reported mild lightheadedness.  Ambulation/Gait Ambulation/Gait assistance: Min assist Gait Distance (Feet): 75 Feet Assistive device: Rolling walker (2 wheeled) Gait Pattern/deviations: Step-to pattern;Step-through pattern;Decreased stride length     General Gait Details: Intermittent assist to steady. Cues for safety, posture, step lengths, sequencing, proper use of RW.  Stairs            Wheelchair Mobility    Modified Rankin (Stroke Patients Only)       Balance Overall balance assessment: Needs assistance;History of Falls         Standing balance support: Bilateral upper extremity supported Standing balance-Leahy Scale:  Fair                               Pertinent Vitals/Pain Pain Assessment: 0-10 Pain Score: 4  Pain Location: R knee Pain Descriptors / Indicators: Discomfort;Sore Pain Intervention(s): Monitored during session;Repositioned (pt declines use of ice)    Home Living Family/patient expects to be discharged to:: Private residence Living Arrangements: Spouse/significant other   Type of Home: House Home Access: Ramped entrance     Home Layout: One level Home Equipment: Environmental consultant - 2 wheels;Cane - single point;Bedside commode      Prior Function Level of Independence: Independent               Hand Dominance        Extremity/Trunk Assessment   Upper Extremity Assessment Upper Extremity Assessment: Overall WFL for tasks assessed    Lower Extremity Assessment Lower Extremity Assessment:  (neuropathy; R LE post of weakness 2* UKA)    Cervical / Trunk Assessment Cervical / Trunk Assessment: Normal  Communication   Communication: No difficulties  Cognition Arousal/Alertness: Awake/alert Behavior During Therapy: WFL for tasks assessed/performed Overall Cognitive Status: Within Functional Limits for tasks assessed                                        General Comments      Exercises Total Joint Exercises Ankle Circles/Pumps: AROM;Both;10 reps Quad Sets: AROM;Both;10 reps Hip ABduction/ADduction: AROM;Right;10 reps Straight  Leg Raises: AROM;Right;10 reps Knee Flexion: AROM;Right;10 reps Goniometric ROM: ~10-110 degrees   Assessment/Plan    PT Assessment Patient needs continued PT services  PT Problem List Decreased strength;Decreased mobility;Decreased activity tolerance;Decreased balance;Pain;Decreased knowledge of use of DME       PT Treatment Interventions DME instruction;Gait training;Therapeutic activities;Therapeutic exercise;Patient/family education;Balance training;Functional mobility training    PT Goals (Current goals can  be found in the Care Plan section)  Acute Rehab PT Goals Patient Stated Goal: regain independence/PLOF PT Goal Formulation: With patient Time For Goal Achievement: 10/07/20 Potential to Achieve Goals: Good    Frequency 7X/week   Barriers to discharge        Co-evaluation               AM-PAC PT "6 Clicks" Mobility  Outcome Measure Help needed turning from your back to your side while in a flat bed without using bedrails?: None Help needed moving from lying on your back to sitting on the side of a flat bed without using bedrails?: None Help needed moving to and from a bed to a chair (including a wheelchair)?: A Little Help needed standing up from a chair using your arms (e.g., wheelchair or bedside chair)?: A Little Help needed to walk in hospital room?: A Little Help needed climbing 3-5 steps with a railing? : A Little 6 Click Score: 20    End of Session Equipment Utilized During Treatment: Gait belt Activity Tolerance: Patient tolerated treatment well Patient left: in chair;with call bell/phone within reach;with chair alarm set   PT Visit Diagnosis: Pain;History of falling (Z91.81);Other abnormalities of gait and mobility (R26.89) Pain - Right/Left: Right Pain - part of body: Knee    Time: 4917-9150 PT Time Calculation (min) (ACUTE ONLY): 24 min   Charges:   PT Evaluation $PT Eval Low Complexity: 1 Low PT Treatments $Gait Training: 8-22 mins           Doreatha Massed, PT Acute Rehabilitation  Office: [phone] Pager: [phone]

## 2020-09-23 NOTE — Progress Notes (Signed)
     Subjective: 1 Day Post-Op s/p Procedure(s): UNICOMPARTMENTAL KNEE  States pain has so far been well controlled. Denies chest pain, SOB, Calf pain. No nausea/vomiting. No other complaints.    Objective:  PE: VITALS:   Vitals:   09/22/20 1829 09/22/20 1956 09/23/20 0006 09/23/20 0416  BP: 137/76 (!) 127/58 118/61 107/70  Pulse: 70 75 65 62  Resp: 14 18 16 16   Temp: 97.6 F (36.4 C) (!) 97.5 F (36.4 C) 97.8 F (36.6 C) 97.6 F (36.4 C)  TempSrc:  Oral Oral Oral  SpO2: 99% 97% 98% 97%  Weight:      Height:       Sitting up in bed in no acute distress ABD soft Neurovascular intact Sensation intact distally Intact pulses distally Dorsiflexion/Plantar flexion intact Incision: dressing C/D/I  LABS  Results for orders placed or performed during the hospital encounter of 09/22/20 (from the past 24 hour(s))  CBC     Status: Abnormal   Collection Time: 09/23/20  3:10 AM  Result Value Ref Range   WBC 12.4 (H) 4.0 - 10.5 K/uL   RBC 4.11 (L) 4.22 - 5.81 MIL/uL   Hemoglobin 12.5 (L) 13.0 - 17.0 g/dL   HCT 38.6 (L) 39 - 52 %   MCV 93.9 80.0 - 100.0 fL   MCH 30.4 26.0 - 34.0 pg   MCHC 32.4 30.0 - 36.0 g/dL   RDW 13.5 11.5 - 15.5 %   Platelets 226 150 - 400 K/uL   nRBC 0.0 0.0 - 0.2 %  Basic metabolic panel     Status: Abnormal   Collection Time: 09/23/20  3:10 AM  Result Value Ref Range   Sodium 135 135 - 145 mmol/L   Potassium 4.4 3.5 - 5.1 mmol/L   Chloride 106 98 - 111 mmol/L   CO2 23 22 - 32 mmol/L   Glucose, Bld 154 (H) 70 - 99 mg/dL   BUN 23 8 - 23 mg/dL   Creatinine, Ser 0.91 0.61 - 1.24 mg/dL   Calcium 8.3 (L) 8.9 - 10.3 mg/dL   GFR, Estimated >60 >60 mL/min   Anion gap 6 5 - 15    DG Knee Right Port  Result Date: 09/22/2020 CLINICAL DATA:  Postop for hemiarthroplasty. EXAM: PORTABLE RIGHT KNEE - 1-2 VIEW COMPARISON:  None. FINDINGS: AP and lateral views demonstrate medial compartment hemiarthroplasty. Joint space narrowing and subchondral sclerosis  within the lateral and patellofemoral compartments. Expected intra-articular gas and minimal fluid. Vascular calcifications. Enthesophyte at the patellar tendon insertion. IMPRESSION: Expected appearance after medial right knee hemiarthroplasty. Electronically Signed   By: Abigail Miyamoto M.D.   On: 09/22/2020 17:42    Assessment/Plan: Principal Problem:   S/P right unicompartmental knee replacement - doing well, feels he's ready to go home today   1 Day Post-Op s/p Procedure(s): UNICOMPARTMENTAL KNEE  Weightbearing: WBAT RLE, up with therapy Insicional and dressing care: Reinforce dressings as needed VTE prophylaxis: Aspirin 325mg  BID x 30 days Pain control: will discharge with hydrocodone Follow - up plan: 2 weeks with Dr. Mardelle Matte Dispo: plan to discharge home today if passes PT  Contact information:   Weekdays 8-5 [address], [place] [phone] A fter hours and holidays please check [email or web address] for group call information for [facility]  Ventura Bruns 09/23/2020, 8:49 AM

## 2020-09-23 NOTE — Discharge Summary (Signed)
Discharge Summary  Patient ID: Jay Bradshaw MRN: [id number] DOB/AGE: 1935-10-20 85 y.o.  Admit date: 09/22/2020 Discharge date: 09/23/2020  Admission Diagnoses:  S/P right unicompartmental knee replacement  Discharge Diagnoses:  Principal Problem:   S/P right unicompartmental knee replacement   Past Medical History:  Diagnosis Date  . Anxiety   . Arthritis   . Balance problem    more noticed in the dark  . Complication of anesthesia    opioids causes urine retention  . Constipation    takes Colace daily as needed  . COPD (chronic obstructive pulmonary disease) ([place])   . Coronary artery disease    takes Metoprolol daily, Prox RCA lesion, 20 %stenosed, Mid RCA lesion 20% stenosed, Dist LAD lesion 20% stenosed EF greater than 65%,, 1st mrg lesion 60% stenosed, mild non-obstructive disease in LAD and RCA, Stenosis of OM Branch appears moderate  . Emphysema lung ([place])   . Enlarged prostate    takes Tamsulosin daily  . History of echocardiogram 09/08/2016   EF 51-70% grade 2 diastolic dysfunction mild RAE Reviewed  . Hyperlipidemia    takes Lovastatin daily  . Hypertension    elevated BP but not on meds  . Hypothyroidism    takes Synthroid daily  . Joint pain   . Osteoarthritis of left shoulder 09/29/2015  . Osteoarthritis of right shoulder, primary 12/16/2014  . PONV (postoperative nausea and vomiting)   . Productive cough   . Seasonal allergies   . Shortness of breath dyspnea   . Tremors of nervous system    essential tremors  . Urinary frequency    takes Ditropan daily    Surgeries: Procedure(s): UNICOMPARTMENTAL KNEE on 09/22/2020   Consultants (if any):   Discharged Condition: Improved  Hospital Course: Jay Bradshaw is an 85 y.o. male who was admitted 09/22/2020 with a diagnosis of S/P right unicompartmental knee replacement and went to the operating room on 09/22/2020 and underwent the above named procedures.    He was given perioperative antibiotics:   Anti-infectives (From admission, onward)   Start     Dose/Rate Route Frequency Ordered Stop   09/22/20 1900  ceFAZolin (ANCEF) IVPB 2g/100 mL premix        2 g 200 mL/hr over 30 Minutes Intravenous Every 6 hours 09/22/20 1656 09/23/20 0104   09/22/20 0930  ceFAZolin (ANCEF) IVPB 2g/100 mL premix        2 g 200 mL/hr over 30 Minutes Intravenous On call to O.R. 09/22/20 0174 09/22/20 1324    .  He was given sequential compression devices, early ambulation, and aspirin for DVT prophylaxis.  He benefited maximally from the hospital stay and there were no complications.    Recent vital signs:  Vitals:   09/23/20 0416 09/23/20 0939  BP: 107/70 (!) 105/57  Pulse: 62 (!) 53  Resp: 16 16  Temp: 97.6 F (36.4 C) (!) 97.5 F (36.4 C)  SpO2: 97% 95%    Recent laboratory studies:  Lab Results  Component Value Date   HGB 12.5 (L) 09/23/2020   HGB 13.2 09/15/2020   HGB 13.6 11/09/2017   Lab Results  Component Value Date   WBC 12.4 (H) 09/23/2020   PLT 226 09/23/2020   Lab Results  Component Value Date   INR 1.0 08/26/2016   Lab Results  Component Value Date   NA 135 09/23/2020   K 4.4 09/23/2020   CL 106 09/23/2020   CO2 23 09/23/2020   BUN 23 09/23/2020   CREATININE 0.91  09/23/2020   GLUCOSE 154 (H) 09/23/2020    Discharge Medications:   Allergies as of 09/23/2020      Reactions   Oxycodone Other (See Comments)   Difficulty urinating for 24 plus hours      Medication List    STOP taking these medications   Acetaminophen 500 MG capsule   aspirin 81 MG tablet Replaced by: aspirin EC 325 MG tablet     TAKE these medications   aspirin EC 325 MG tablet Take 1 tablet (325 mg total) by mouth 2 (two) times daily. Replaces: aspirin 81 MG tablet   baclofen 10 MG tablet Commonly known as: LIORESAL Take 1 tablet (10 mg total) by mouth 3 (three) times daily. As needed for muscle spasm   beta carotene w/minerals tablet Take 1 tablet by mouth every other day.  Alternate with multivitamin   bisacodyl 5 MG EC tablet Commonly known as: DULCOLAX Take 5 mg by mouth daily as needed for moderate constipation.   CALCIUM-MAGNESIUM PO Take 500-800 mg by mouth 2 (two) times daily.   cholecalciferol 10 MCG (400 UNIT) Tabs tablet Commonly known as: VITAMIN D3 Take 400 Units by mouth 2 (two) times daily.   Co Q10 100 MG Caps Take 200 mg by mouth daily.   HYDROcodone-acetaminophen 5-325 MG tablet Commonly known as: Norco Take 1-2 tablets by mouth every 6 (six) hours as needed for moderate pain or severe pain. MAXIMUM TOTAL ACETAMINOPHEN DOSE IS 4000 MG PER DAY   levothyroxine 88 MCG tablet Commonly known as: SYNTHROID Take 88 mcg by mouth daily before breakfast.   lovastatin 20 MG tablet Commonly known as: MEVACOR Take 20 mg by mouth daily.   melatonin 3 MG Tabs tablet Take 3 mg by mouth at bedtime as needed (sleep).   metoprolol succinate 50 MG 24 hr tablet Commonly known as: TOPROL-XL Take 50 mg by mouth at bedtime.   multivitamin capsule Take 1 capsule by mouth every other day. Alternate with Ocuvite   nitroGLYCERIN 0.4 MG SL tablet Commonly known as: NITROSTAT Place 1 tablet (0.4 mg total) under the tongue every 5 (five) minutes as needed for chest pain (up to 3 doses MAX).   ondansetron 4 MG tablet Commonly known as: Zofran Take 1 tablet (4 mg total) by mouth every 8 (eight) hours as needed for nausea or vomiting.   Potassium 99 MG Tabs Take 99 mg by mouth at bedtime.   Preparation H 1 % Generic drug: hydrocortisone cream Apply 1 application topically daily as needed for itching.   PSYLLIUM HUSK PO Take 1 capsule by mouth 3 (three) times daily as needed (depending on meals he ate that day). Gel caps   saw palmetto 160 MG capsule Take 160 mg by mouth 3 (three) times daily.   tamsulosin 0.4 MG Caps capsule Commonly known as: FLOMAX Take 0.4 mg by mouth See admin instructions. Monday midnight, Thursday morning at 8am and  Saturday at 1600   vitamin C 500 MG tablet Commonly known as: ASCORBIC ACID Take 500 mg by mouth every other day. Alternate with vitamin E   VITAMIN E BLEND PO Take 300 Units by mouth every other day. Alternate with vitamin C       Diagnostic Studies: DG Knee Right Port  Result Date: 09/22/2020 CLINICAL DATA:  Postop for hemiarthroplasty. EXAM: PORTABLE RIGHT KNEE - 1-2 VIEW COMPARISON:  None. FINDINGS: AP and lateral views demonstrate medial compartment hemiarthroplasty. Joint space narrowing and subchondral sclerosis within the lateral and patellofemoral compartments.  Expected intra-articular gas and minimal fluid. Vascular calcifications. Enthesophyte at the patellar tendon insertion. IMPRESSION: Expected appearance after medial right knee hemiarthroplasty. Electronically Signed   By: Abigail Miyamoto M.D.   On: 09/22/2020 17:42    Disposition: Discharge disposition: 01-Home or Self Care          Follow-up Information    Marchia Bond, MD. Go on 10/05/2020.   Specialty: Orthopedic Surgery Why: Your appointment is scheduled for 11:15.  Contact information: [address] [place] [address] [phone]        [facility]. Go on 09/24/2020.   Why: Your appointment has been scheduled for 11:00. Please arrive at 10:45 to complete paperwork   Contact information: [address]   [phone]               Signed: Ventura Bruns PA-C 09/23/2020, 12:58 PM

## 2020-09-23 NOTE — Progress Notes (Signed)
Patient discharged to home w/ family. Given all belongings, instructions. Verbalized understanding of all instructions. Escorted to pov via w/c. 

## 2020-09-23 NOTE — Progress Notes (Signed)
Physical Therapy Treatment Patient Details Name: Jay Bradshaw MRN: [id number] DOB: 1935-02-07 Today's Date: 09/23/2020    History of Present Illness 85 yo male s/p R unicompartmental knee replacement 09/22/20. Hx of bil TSA 2021, OA, COPD, neuropathy    PT Comments    Progressing with mobility. Pt stated he is set to begin OP PT on tomorrow. All education completed. Okay to d/c from PT standpoint.    Follow Up Recommendations  Follow surgeon's recommendation for DC plan and follow-up therapies     Equipment Recommendations  None recommended by PT    Recommendations for Other Services       Precautions / Restrictions Precautions Precautions: Fall Restrictions Weight Bearing Restrictions: No Other Position/Activity Restrictions: WBAT    Mobility  Bed Mobility                Transfers Overall transfer level: Needs assistance Equipment used: Rolling walker (2 wheeled) Transfers: Sit to/from Stand Sit to Stand: Supervision         General transfer comment: VCs safety, technique, hand placement. Increased time.  Ambulation/Gait Ambulation/Gait assistance: Min guard Gait Distance (Feet): 140 Feet Assistive device: Rolling walker (2 wheeled) Gait Pattern/deviations: Step-to pattern;Step-through pattern;Decreased stride length     General Gait Details: Min guard for safety. Cues for sequencing, proper use of RW. Pt tolerated distance well.   Stairs             Wheelchair Mobility    Modified Rankin (Stroke Patients Only)       Balance Overall balance assessment: Needs assistance;History of Falls         Standing balance support: Bilateral upper extremity supported Standing balance-Leahy Scale: Fair                              Cognition Arousal/Alertness: Awake/alert Behavior During Therapy: WFL for tasks assessed/performed Overall Cognitive Status: Within Functional Limits for tasks assessed                                         Exercises     General Comments        Pertinent Vitals/Pain Pain Assessment: 0-10 Pain Score: 4  Pain Location: R knee Pain Descriptors / Indicators: Discomfort;Sore Pain Intervention(s): Monitored during session;Repositioned    Home Living                      Prior Function            PT Goals (current goals can now be found in the care plan section) Acute Rehab PT Goals Patient Stated Goal: regain independence/PLOF PT Goal Formulation: With patient Time For Goal Achievement: 10/07/20 Potential to Achieve Goals: Good Progress towards PT goals: Progressing toward goals    Frequency    7X/week      PT Plan Current plan remains appropriate    Co-evaluation              AM-PAC PT "6 Clicks" Mobility   Outcome Measure  Help needed turning from your back to your side while in a flat bed without using bedrails?: None Help needed moving from lying on your back to sitting on the side of a flat bed without using bedrails?: None Help needed moving to and from a bed to a chair (including a wheelchair)?: A Little Help needed  standing up from a chair using your arms (e.g., wheelchair or bedside chair)?: A Little Help needed to walk in hospital room?: A Little Help needed climbing 3-5 steps with a railing? : A Little 6 Click Score: 20    End of Session Equipment Utilized During Treatment: Gait belt Activity Tolerance: Patient tolerated treatment well Patient left: in bed;with call bell/phone within reach   PT Visit Diagnosis: Pain;History of falling (Z91.81);Other abnormalities of gait and mobility (R26.89) Pain - Right/Left: Right Pain - part of body: Knee     Time: 1751-0258 PT Time Calculation (min) (ACUTE ONLY): 10 min  Charges:  $Gait Training: 8-22 mins                         Doreatha Massed, PT Acute Rehabilitation  Office: [phone] Pager: [phone]

## 2020-09-24 ENCOUNTER — Other Ambulatory Visit: Payer: Self-pay

## 2020-09-24 ENCOUNTER — Ambulatory Visit (INDEPENDENT_AMBULATORY_CARE_PROVIDER_SITE_OTHER): Payer: Medicare HMO | Admitting: Rehabilitative and Restorative Service Providers"

## 2020-09-24 DIAGNOSIS — R600 Localized edema: Secondary | ICD-10-CM

## 2020-09-24 DIAGNOSIS — M25561 Pain in right knee: Secondary | ICD-10-CM

## 2020-09-24 DIAGNOSIS — M6281 Muscle weakness (generalized): Secondary | ICD-10-CM | POA: Diagnosis not present

## 2020-09-24 DIAGNOSIS — R29898 Other symptoms and signs involving the musculoskeletal system: Secondary | ICD-10-CM

## 2020-09-24 NOTE — Therapy (Signed)
[place], [place], [address] Phone: [phone]   Fax:  [phone]  Physical Therapy Evaluation  Patient Details  Name: Jay Bradshaw MRN: [id number] Date of Birth: 28-Nov-1935 Referring Provider (PT): Marchia Bond, MD   Encounter Date: 09/24/2020   PT End of Session - 09/24/20 1145    Visit Number 1    Number of Visits 12    Date for PT Re-Evaluation 11/05/20    Authorization Type aetna medicare    PT Start Time 1108    PT Stop Time 1158    PT Time Calculation (min) 50 min    Activity Tolerance Patient tolerated treatment well    Behavior During Therapy [facility] for tasks assessed/performed           Past Medical History:  Diagnosis Date  . Anxiety   . Arthritis   . Balance problem    more noticed in the dark  . Complication of anesthesia    opioids causes urine retention  . Constipation    takes Colace daily as needed  . COPD (chronic obstructive pulmonary disease) ([place])   . Coronary artery disease    takes Metoprolol daily, Prox RCA lesion, 20 %stenosed, Mid RCA lesion 20% stenosed, Dist LAD lesion 20% stenosed EF greater than 65%,, 1st mrg lesion 60% stenosed, mild non-obstructive disease in LAD and RCA, Stenosis of OM Branch appears moderate  . Emphysema lung ([place])   . Enlarged prostate    takes Tamsulosin daily  . History of echocardiogram 09/08/2016   EF 89-21% grade 2 diastolic dysfunction mild RAE Reviewed  . Hyperlipidemia    takes Lovastatin daily  . Hypertension    elevated BP but not on meds  . Hypothyroidism    takes Synthroid daily  . Joint pain   . Osteoarthritis of left shoulder 09/29/2015  . Osteoarthritis of right shoulder, primary 12/16/2014  . PONV (postoperative nausea and vomiting)   . Productive cough   . Seasonal allergies   . Shortness of breath dyspnea   . Tremors of nervous system    essential tremors  . Urinary frequency    takes Ditropan daily     Past Surgical History:  Procedure Laterality Date  . CARDIAC CATHETERIZATION N/A 09/25/2015   Procedure: Left Heart Cath and Coronary Angiography;  Surgeon: Burnell Blanks, MD;  Location: [place] [facility];  Service: Cardiovascular;  Laterality: N/A;  . CARDIAC CATHETERIZATION N/A 08/29/2016   Procedure: Left Heart Cath and Coronary Angiography;  Surgeon: Burnell Blanks, MD;  Location: [place] [facility];  Service: Cardiovascular;  Laterality: N/A;  . CATARACT EXTRACTION  Aug and Sep 2011   first left eye, then right eye  . COLONOSCOPY  June 2008   Dr. Lurena Joiner ref by Ruthann Cancer  . CYSTOSCOPY WITH INSERTION OF UROLIFT N/A 11/09/2017   Procedure: CYSTOSCOPY WITH INSERTION OF UROLIFT;  Surgeon: Franchot Gallo, MD;  Location: [facility];  Service: Urology;  Laterality: N/A;  . excision of fibroma  October 1994   in skin on back of neck right of midline, Dede Query ref by Lidia Collum  . HERNIA REPAIR    . IRRIGATION AND DEBRIDEMENT SEBACEOUS CYST  2010   from the back, [facility]  . LIPOMA EXCISION  Nov 2004   from neck, right side (near location of mandible cyst), Dr Lorrine Kin ref by Rasalen  . PARTIAL KNEE ARTHROPLASTY Right 09/22/2020   Procedure: UNICOMPARTMENTAL KNEE;  Surgeon:  Marchia Bond, MD;  [facility];  Service: Orthopedics;  Laterality: Right;  . PROSTATE BIOPSY  2006   Dr. Leia Alf  . SHOULDER HEMI-ARTHROPLASTY Right 12/16/2014   Procedure: SHOULDER HEMI-ARTHROPLASTY;  Surgeon: Johnny Bridge, MD;  Location: [place];  Service: Orthopedics;  Laterality: Right;  . SHOULDER SURGERY Right 12/16/2014   hemi-arthroplasty    dr Mardelle Matte  . thyroid adenoma removal  August 1970   right side, [facility]  . TOTAL SHOULDER ARTHROPLASTY Right 12/16/2014   Procedure: RIGHT TOTAL SHOULDER ARTHROPLASTY;  Surgeon: Johnny Bridge, MD;  Location: [place];  Service: Orthopedics;  Laterality: Right;  . TOTAL SHOULDER ARTHROPLASTY  Left 09/29/2015  . TOTAL SHOULDER ARTHROPLASTY Left 09/29/2015   Procedure: TOTAL LEFT SHOULDER ARTHROPLASTY;  Surgeon: Marchia Bond, MD;  Location: [place];  Service: Orthopedics;  Laterality: Left;  . vacuolar cyst removal     in right mandible fixed/filled, Denyce Robert ref by Synetta Shadow, DDS  . wisdom teeth extracted      There were no vitals filed for this visit.    Subjective Assessment - 09/24/20 1113    Subjective The patient is s/p R unicompartmental knee replacement 09/22/20.  The patient d/c home on 09/23/20 and is walking with RW in the home.    Pertinent History CAD, HTN, s/p shoulder replacement, h/o L ankle ligamentous injury    Patient Stated Goals no pain with pivoting and walking without device    Currently in Pain? Yes    Pain Score 5     Pain Location Knee    Pain Orientation Right    Pain Type Surgical pain    Pain Onset In the past 7 days    Pain Frequency Constant    Aggravating Factors  walking, hard to get comfortable last night for positioning    Pain Relieving Factors tylenol (avoiding narcotic pain meds)              [facility] PT Assessment - 09/24/20 1119      Assessment   Medical Diagnosis R unicompartmental knee replacement    Referring Provider (PT) Marchia Bond, MD    Onset Date/Surgical Date 09/22/20    Hand Dominance Right    Next MD Visit 10/05/20    Prior Therapy in acute care      Precautions   Precautions Knee;Fall      Restrictions   Weight Bearing Restrictions Yes    RLE Weight Bearing Weight bearing as tolerated      Balance Screen   Has the patient fallen in the past 6 months Yes    How many times? 1 while walking backwards,   Other fall years ago    Has the patient had a decrease in activity level because of a fear of falling?  No    Is the patient reluctant to leave their home because of a fear of falling?  No      Home Environment   Living Environment Private residence    Living Arrangements Spouse/significant other     Type of [place] to enter;Ramped entrance    Entrance Stairs-Rails Can reach both    [place] One level    [place] - 2 wheels   tall toilet     Prior Function   Level of Independence Independent      Observation/Other Assessments   Focus on Therapeutic Outcomes (FOTO)  60% limitation      Observation/Other Assessments-Edema  Edema Circumferential      Circumferential Edema   Circumferential - Right 38.5 cm    Circumferential - Left  44cm      Sensation   Light Touch Appears Intact      ROM / Strength   AROM / PROM / Strength AROM;Strength;PROM      AROM   Overall AROM  Deficits    AROM Assessment Site Knee    Right/Left Knee Right    Right Knee Extension -10    Right Knee Flexion 108      PROM   Overall PROM Comments -10 R in supine; 105 knee to chest *patient notes he wants to limit this due to soreness      Strength   Overall Strength Deficits    Overall Strength Comments -22 degrees R knee extensor lag      Flexibility   Soft Tissue Assessment /Muscle Length yes    Hamstrings tightness in hamstrings noted      Palpation   Patella mobility TBA    Palpation comment tightness reported around knee with some edema *see circumferential measurements      Transfers   Transfers Sit to Stand;Stand to Sit    Sit to Stand 6: Modified independent (Device/Increase time)    Stand to Sit 6: Modified independent (Device/Increase time)    Comments to RW      Ambulation/Gait   Ambulation/Gait Yes    Ambulation/Gait Assistance 6: Modified independent (Device/Increase time)    Ambulation Distance (Feet) 100 Feet    Assistive device Rolling walker    Gait Pattern Step-through pattern;Antalgic    Ambulation Surface Level;Indoor    Gait velocity 1.14 ft/sec                      Objective measurements completed on examination: See above findings.       [place] Adult PT Treatment/Exercise - 09/24/20 1119       Ambulation/Gait   Stairs --   uses ramp at home     Exercises   Exercises Knee/Hip      Knee/Hip Exercises: Seated   Long Arc Quad Strengthening;Right;5 reps    Other Seated Knee/Hip Exercises ankle pumps      Knee/Hip Exercises: Supine   Quad Sets Strengthening;Right;10 reps    Heel Slides Strengthening;Right;10 reps    Straight Leg Raises Strengthening;Right;10 reps                  PT Education - 09/24/20 1144    Education Details HEP    Person(s) Educated Patient    Methods Explanation;Demonstration;Handout    Comprehension Verbalized understanding;Returned demonstration               PT Long Term Goals - 09/24/20 1152      PT LONG TERM GOAL #1   Title The patient will be indep with HEP for R LE strengthening, flexibility.    Time 6    Period Weeks    Target Date 11/05/20      PT LONG TERM GOAL #2   Title The patient improve R knee AROM to 4 to 115 degrees    Baseline 10 to 108    Time 6    Period Weeks    Target Date 11/05/20      PT LONG TERM GOAL #3   Title The patient will improve R quad strength demonstrating extensor lag < or equal to 6 degrees,    Baseline -22 degrees  Time 6    Period Weeks    Target Date 11/05/20      PT LONG TERM GOAL #4   Title The patient will improve gait speed from 1.14 ft/sec to > or equal to 1.8 ft/sec    Time 6    Period Weeks    Target Date 11/05/20      PT LONG TERM GOAL #5   Title The patient will reduce functional limitation per FOTO from 60% to < or equal to 27%.    Time 6    Period Weeks    Target Date 11/05/20                  Plan - 09/24/20 1211    Clinical Impression Statement The patient is an 85 year old male s/p R unicompartmental knee replacement 09/22/20.  He presents with impairments in A/ROM, P/ROM, strength, dec'd gait, dec'd weight bearing with standing tasks, edema R knee, pain R knee leading to to dec'd ADLs and IADL performance.    Personal Factors and Comorbidities  Comorbidity 2    Comorbidities HTN, CAD    Examination-Activity Limitations Stairs;Stand;Locomotion Level;Squat    Examination-Participation Restrictions Community Activity;Meal Prep;Driving;Yard Work    Stability/Clinical Decision Making Stable/Uncomplicated    Designer, jewellery Low    Rehab Potential Good    PT Frequency 2x / week    PT Duration 6 weeks    PT Treatment/Interventions ADLs/Self Care Home Management;Gait training;Stair training;Functional mobility training;Therapeutic activities;Therapeutic exercise;Neuromuscular re-education;Manual techniques;Taping;Patient/family education;Passive range of motion;Electrical Stimulation;Cryotherapy;Vasopneumatic Device    PT Next Visit Plan progress HEP working on flexibility, LE strengthening, gait mechanics and using modalities (ice/vaso as needed)    PT Home Exercise Plan [email or web address]    Consulted and Agree with Plan of Care Patient           Patient will benefit from skilled therapeutic intervention in order to improve the following deficits and impairments:  Impaired vision/preception, Abnormal gait, Decreased range of motion, Decreased strength, Decreased activity tolerance, Decreased balance, Impaired flexibility, Hypomobility, Increased fascial restricitons  Visit Diagnosis: Acute pain of right knee  Muscle weakness (generalized)  Localized edema  Other symptoms and signs involving the musculoskeletal system     Problem List Patient Active Problem List   Diagnosis Date Noted  . S/P right unicompartmental knee replacement 09/22/2020  . Osteoarthritis of left shoulder 09/29/2015  . Precordial pain   . Abnormal stress test   . Osteoarthritis of right shoulder, primary 12/16/2014  . S/P shoulder replacement 12/16/2014  . Preop cardiovascular exam 09/17/2014  . Coronary artery disease involving native coronary artery of native heart without angina pectoris 01/29/2014  . HTN (hypertension) 01/29/2014  . Tremor  01/29/2014    Tranquill[place], PT 09/24/2020, 12:19 PM  [facility] [place] [address], [place], [address] Phone: [phone]   Fax:  [phone]  Name: Jay Bradshaw MRN: [id number] Date of Birth: 07-09-1935

## 2020-09-24 NOTE — Patient Instructions (Signed)
Access Code: [email or web address] URL: [email or web address] Date: 09/24/2020 Prepared by: Rudell Cobb  Exercises Seated Ankle Pumps - 2 x daily - 7 x weekly - 1 sets - 10 reps Seated Long Arc Quad - 2 x daily - 7 x weekly - 1 sets - 10 reps Supine Heel Slide - 2 x daily - 7 x weekly - 1 sets - 10 reps Supine Straight Leg Raises - 2 x daily - 7 x weekly - 1 sets - 10 reps Quad Setting and Stretching - 2 x daily - 7 x weekly - 1 sets - 10 reps - 5 seconds hold

## 2020-09-30 ENCOUNTER — Ambulatory Visit (INDEPENDENT_AMBULATORY_CARE_PROVIDER_SITE_OTHER): Payer: Medicare HMO | Admitting: Physical Therapy

## 2020-09-30 ENCOUNTER — Other Ambulatory Visit: Payer: Self-pay

## 2020-09-30 ENCOUNTER — Encounter: Payer: Self-pay | Admitting: Physical Therapy

## 2020-09-30 DIAGNOSIS — M25561 Pain in right knee: Secondary | ICD-10-CM

## 2020-09-30 DIAGNOSIS — R600 Localized edema: Secondary | ICD-10-CM

## 2020-09-30 DIAGNOSIS — M6281 Muscle weakness (generalized): Secondary | ICD-10-CM | POA: Diagnosis not present

## 2020-09-30 DIAGNOSIS — R29898 Other symptoms and signs involving the musculoskeletal system: Secondary | ICD-10-CM

## 2020-09-30 NOTE — Therapy (Signed)
[place], [place], [address] Phone: [phone]   Fax:  [phone]  Physical Therapy Treatment  Patient Details  Name: Jay Bradshaw MRN: [id number] Date of Birth: 1935-02-05 Referring Provider (PT): Marchia Bond, MD   Encounter Date: 09/30/2020   PT End of Session - 09/30/20 1147    Visit Number 2    Number of Visits 12    Date for PT Re-Evaluation 11/05/20    Authorization Type aetna medicare    PT Start Time 1147    PT Stop Time 1240    PT Time Calculation (min) 53 min    Activity Tolerance Patient tolerated treatment well    Behavior During Therapy [facility] for tasks assessed/performed           Past Medical History:  Diagnosis Date  . Anxiety   . Arthritis   . Balance problem    more noticed in the dark  . Complication of anesthesia    opioids causes urine retention  . Constipation    takes Colace daily as needed  . COPD (chronic obstructive pulmonary disease) ([place])   . Coronary artery disease    takes Metoprolol daily, Prox RCA lesion, 20 %stenosed, Mid RCA lesion 20% stenosed, Dist LAD lesion 20% stenosed EF greater than 65%,, 1st mrg lesion 60% stenosed, mild non-obstructive disease in LAD and RCA, Stenosis of OM Branch appears moderate  . Emphysema lung ([place])   . Enlarged prostate    takes Tamsulosin daily  . History of echocardiogram 09/08/2016   EF 27-03% grade 2 diastolic dysfunction mild RAE Reviewed  . Hyperlipidemia    takes Lovastatin daily  . Hypertension    elevated BP but not on meds  . Hypothyroidism    takes Synthroid daily  . Joint pain   . Osteoarthritis of left shoulder 09/29/2015  . Osteoarthritis of right shoulder, primary 12/16/2014  . PONV (postoperative nausea and vomiting)   . Productive cough   . Seasonal allergies   . Shortness of breath dyspnea   . Tremors of nervous system    essential tremors  . Urinary frequency    takes Ditropan daily     Past Surgical History:  Procedure Laterality Date  . CARDIAC CATHETERIZATION N/A 09/25/2015   Procedure: Left Heart Cath and Coronary Angiography;  Surgeon: Burnell Blanks, MD;  Location: [place] [facility];  Service: Cardiovascular;  Laterality: N/A;  . CARDIAC CATHETERIZATION N/A 08/29/2016   Procedure: Left Heart Cath and Coronary Angiography;  Surgeon: Burnell Blanks, MD;  Location: [place] [facility];  Service: Cardiovascular;  Laterality: N/A;  . CATARACT EXTRACTION  Aug and Sep 2011   first left eye, then right eye  . COLONOSCOPY  June 2008   Dr. Lurena Joiner ref by Ruthann Cancer  . CYSTOSCOPY WITH INSERTION OF UROLIFT N/A 11/09/2017   Procedure: CYSTOSCOPY WITH INSERTION OF UROLIFT;  Surgeon: Franchot Gallo, MD;  Location: [facility];  Service: Urology;  Laterality: N/A;  . excision of fibroma  October 1994   in skin on back of neck right of midline, Dede Query ref by Lidia Collum  . HERNIA REPAIR    . IRRIGATION AND DEBRIDEMENT SEBACEOUS CYST  2010   from the back, [facility]  . LIPOMA EXCISION  Nov 2004   from neck, right side (near location of mandible cyst), Dr Lorrine Kin ref by Rasalen  . PARTIAL KNEE ARTHROPLASTY Right 09/22/2020   Procedure: UNICOMPARTMENTAL KNEE;  Surgeon:  Marchia Bond, MD;  [facility];  Service: Orthopedics;  Laterality: Right;  . PROSTATE BIOPSY  2006   Dr. Leia Alf  . SHOULDER HEMI-ARTHROPLASTY Right 12/16/2014   Procedure: SHOULDER HEMI-ARTHROPLASTY;  Surgeon: Johnny Bridge, MD;  Location: [place];  Service: Orthopedics;  Laterality: Right;  . SHOULDER SURGERY Right 12/16/2014   hemi-arthroplasty    dr Mardelle Matte  . thyroid adenoma removal  August 1970   right side, O. Ptr.Schumacher/Hermann  . TOTAL SHOULDER ARTHROPLASTY Right 12/16/2014   Procedure: RIGHT TOTAL SHOULDER ARTHROPLASTY;  Surgeon: Johnny Bridge, MD;  Location: [place];  Service: Orthopedics;  Laterality: Right;  . TOTAL SHOULDER ARTHROPLASTY  Left 09/29/2015  . TOTAL SHOULDER ARTHROPLASTY Left 09/29/2015   Procedure: TOTAL LEFT SHOULDER ARTHROPLASTY;  Surgeon: Marchia Bond, MD;  Location: [place];  Service: Orthopedics;  Laterality: Left;  . vacuolar cyst removal     in right mandible fixed/filled, Denyce Robert ref by Synetta Shadow, DDS  . wisdom teeth extracted      There were no vitals filed for this visit.   Subjective Assessment - 09/30/20 1152    Subjective The patient is s/p R unicompartmental knee replacement 09/22/20.  Pain is one notch lower than the last time.    Pertinent History CAD, HTN, s/p shoulder replacement, h/o L ankle ligamentous injury    Patient Stated Goals no pain with pivoting and walking without device    Currently in Pain? Yes    Pain Score 4     Pain Location Knee    Pain Orientation Right    Pain Descriptors / Indicators Aching;Sore    Pain Type Surgical pain                             [facility] Adult PT Treatment/Exercise - 09/30/20 0001      Knee/Hip Exercises: Aerobic   Stationary Bike partial revolutions x 5 min      Knee/Hip Exercises: Seated   Long Arc Quad Right;20 reps    Long Arc Quad Limitations on foam pad due to pt's height    Heel Slides Right;20 reps    Heel Slides Limitations on foam pad    Other Seated Knee/Hip Exercises ankle pumps    Sit to [place] 5 reps   on foam pad BUE support VCs to control descent     Knee/Hip Exercises: Supine   Quad Sets Strengthening;Right;10 reps    Heel Slides Strengthening;Right;15 reps    Bridges 10 reps    Straight Leg Raises Strengthening;Right;10 reps      Modalities   Modalities Vasopneumatic      Vasopneumatic   Number Minutes Vasopneumatic  8 minutes    Vasopnuematic Location  Knee    Vasopneumatic Pressure Low    Vasopneumatic Temperature  34 deg                       PT Long Term Goals - 09/24/20 1152      PT LONG TERM GOAL #1   Title The patient will be indep with HEP for R LE strengthening,  flexibility.    Time 6    Period Weeks    Target Date 11/05/20      PT LONG TERM GOAL #2   Title The patient improve R knee AROM to 4 to 115 degrees    Baseline 10 to 108    Time 6    Period Weeks  Target Date 11/05/20      PT LONG TERM GOAL #3   Title The patient will improve R quad strength demonstrating extensor lag < or equal to 6 degrees,    Baseline -22 degrees    Time 6    Period Weeks    Target Date 11/05/20      PT LONG TERM GOAL #4   Title The patient will improve gait speed from 1.14 ft/sec to > or equal to 1.8 ft/sec    Time 6    Period Weeks    Target Date 11/05/20      PT LONG TERM GOAL #5   Title The patient will reduce functional limitation per FOTO from 60% to < or equal to 27%.    Time 6    Period Weeks    Target Date 11/05/20                 Plan - 09/30/20 1230    Clinical Impression Statement Patient presents for initial f/u visit with small decrease in pain in right knee but concerned re: edema in ankle from compression stockings. He is not wearing them now but gets up every hour to move about. PT advised elevating above heart with ankle pumps. He states he has not been fully compliant with HEP. He did well with exercises today without complaint. Vaso was too constrictive today on low so may just go with ice next visit.    Personal Factors and Comorbidities Comorbidity 2    Comorbidities HTN, CAD    Examination-Activity Limitations Stairs;Stand;Locomotion Level;Squat    Examination-Participation Restrictions Community Activity;Meal Prep;Driving;Yard Work    PT Frequency 2x / week    PT Duration 6 weeks    PT Treatment/Interventions ADLs/Self Care Home Management;Gait training;Stair training;Functional mobility training;Therapeutic activities;Therapeutic exercise;Neuromuscular re-education;Manual techniques;Taping;Patient/family education;Passive range of motion;Electrical Stimulation;Cryotherapy;Vasopneumatic Device    PT Next Visit Plan  progress HEP working on flexibility, LE strengthening, gait mechanics and using modalities (ice/vaso as needed)    PT Home Exercise Plan [email or web address]    Consulted and Agree with Plan of Care Patient           Patient will benefit from skilled therapeutic intervention in order to improve the following deficits and impairments:  Impaired vision/preception, Abnormal gait, Decreased range of motion, Decreased strength, Decreased activity tolerance, Decreased balance, Impaired flexibility, Hypomobility, Increased fascial restricitons  Visit Diagnosis: Acute pain of right knee  Muscle weakness (generalized)  Localized edema  Other symptoms and signs involving the musculoskeletal system     Problem List Patient Active Problem List   Diagnosis Date Noted  . S/P right unicompartmental knee replacement 09/22/2020  . Osteoarthritis of left shoulder 09/29/2015  . Precordial pain   . Abnormal stress test   . Osteoarthritis of right shoulder, primary 12/16/2014  . S/P shoulder replacement 12/16/2014  . Preop cardiovascular exam 09/17/2014  . Coronary artery disease involving native coronary artery of native heart without angina pectoris 01/29/2014  . HTN (hypertension) 01/29/2014  . Tremor 01/29/2014    Madelyn Flavors PT 09/30/2020, 12:53 PM  [facility] [place], [place], [address] Phone: [phone]   Fax:  [phone]  Name: Jay Bradshaw MRN: [id number] Date of Birth: 1935/06/16

## 2020-10-06 ENCOUNTER — Other Ambulatory Visit: Payer: Self-pay

## 2020-10-06 ENCOUNTER — Ambulatory Visit (INDEPENDENT_AMBULATORY_CARE_PROVIDER_SITE_OTHER): Payer: Medicare HMO | Admitting: Physical Therapy

## 2020-10-06 DIAGNOSIS — M6281 Muscle weakness (generalized): Secondary | ICD-10-CM | POA: Diagnosis not present

## 2020-10-06 DIAGNOSIS — R600 Localized edema: Secondary | ICD-10-CM

## 2020-10-06 DIAGNOSIS — R29898 Other symptoms and signs involving the musculoskeletal system: Secondary | ICD-10-CM

## 2020-10-06 DIAGNOSIS — M25561 Pain in right knee: Secondary | ICD-10-CM | POA: Diagnosis not present

## 2020-10-06 NOTE — Therapy (Signed)
[place], [place], [address] Phone: [phone]   Fax:  [phone]  Physical Therapy Treatment  Patient Details  Name: Jay Bradshaw MRN: [id number] Date of Birth: 05-13-1935 Referring Provider (PT): Marchia Bond, MD   Encounter Date: 10/06/2020   PT End of Session - 10/06/20 1148    Visit Number 3    Number of Visits 12    Date for PT Re-Evaluation 11/05/20    Authorization Type aetna medicare    PT Start Time 1104    PT Stop Time 1153    PT Time Calculation (min) 49 min    Activity Tolerance Patient tolerated treatment well    Behavior During Therapy [facility] for tasks assessed/performed           Past Medical History:  Diagnosis Date  . Anxiety   . Arthritis   . Balance problem    more noticed in the dark  . Complication of anesthesia    opioids causes urine retention  . Constipation    takes Colace daily as needed  . COPD (chronic obstructive pulmonary disease) ([place])   . Coronary artery disease    takes Metoprolol daily, Prox RCA lesion, 20 %stenosed, Mid RCA lesion 20% stenosed, Dist LAD lesion 20% stenosed EF greater than 65%,, 1st mrg lesion 60% stenosed, mild non-obstructive disease in LAD and RCA, Stenosis of OM Branch appears moderate  . Emphysema lung ([place])   . Enlarged prostate    takes Tamsulosin daily  . History of echocardiogram 09/08/2016   EF 20-94% grade 2 diastolic dysfunction mild RAE Reviewed  . Hyperlipidemia    takes Lovastatin daily  . Hypertension    elevated BP but not on meds  . Hypothyroidism    takes Synthroid daily  . Joint pain   . Osteoarthritis of left shoulder 09/29/2015  . Osteoarthritis of right shoulder, primary 12/16/2014  . PONV (postoperative nausea and vomiting)   . Productive cough   . Seasonal allergies   . Shortness of breath dyspnea   . Tremors of nervous system    essential tremors  . Urinary frequency    takes Ditropan daily     Past Surgical History:  Procedure Laterality Date  . CARDIAC CATHETERIZATION N/A 09/25/2015   Procedure: Left Heart Cath and Coronary Angiography;  Surgeon: Burnell Blanks, MD;  Location: [place] [facility];  Service: Cardiovascular;  Laterality: N/A;  . CARDIAC CATHETERIZATION N/A 08/29/2016   Procedure: Left Heart Cath and Coronary Angiography;  Surgeon: Burnell Blanks, MD;  Location: [place] [facility];  Service: Cardiovascular;  Laterality: N/A;  . CATARACT EXTRACTION  Aug and Sep 2011   first left eye, then right eye  . COLONOSCOPY  June 2008   Dr. Lurena Joiner ref by Ruthann Cancer  . CYSTOSCOPY WITH INSERTION OF UROLIFT N/A 11/09/2017   Procedure: CYSTOSCOPY WITH INSERTION OF UROLIFT;  Surgeon: Franchot Gallo, MD;  Location: [facility];  Service: Urology;  Laterality: N/A;  . excision of fibroma  October 1994   in skin on back of neck right of midline, Dede Query ref by Lidia Collum  . HERNIA REPAIR    . IRRIGATION AND DEBRIDEMENT SEBACEOUS CYST  2010   from the back, [facility]  . LIPOMA EXCISION  Nov 2004   from neck, right side (near location of mandible cyst), Dr Lorrine Kin ref by [place]  . PARTIAL KNEE ARTHROPLASTY Right 09/22/2020   Procedure: UNICOMPARTMENTAL KNEE;  Surgeon:  Marchia Bond, MD;  [facility];  Service: Orthopedics;  Laterality: Right;  . PROSTATE BIOPSY  2006   Dr. Leia Alf  . SHOULDER HEMI-ARTHROPLASTY Right 12/16/2014   Procedure: SHOULDER HEMI-ARTHROPLASTY;  Surgeon: Johnny Bridge, MD;  Location: [place];  Service: Orthopedics;  Laterality: Right;  . SHOULDER SURGERY Right 12/16/2014   hemi-arthroplasty    dr Mardelle Matte  . thyroid adenoma removal  August 1970   right side, [facility]  . TOTAL SHOULDER ARTHROPLASTY Right 12/16/2014   Procedure: RIGHT TOTAL SHOULDER ARTHROPLASTY;  Surgeon: Johnny Bridge, MD;  Location: [place];  Service: Orthopedics;  Laterality: Right;  . TOTAL SHOULDER ARTHROPLASTY  Left 09/29/2015  . TOTAL SHOULDER ARTHROPLASTY Left 09/29/2015   Procedure: TOTAL LEFT SHOULDER ARTHROPLASTY;  Surgeon: Marchia Bond, MD;  Location: [place];  Service: Orthopedics;  Laterality: Left;  . vacuolar cyst removal     in right mandible fixed/filled, Denyce Robert ref by Synetta Shadow, DDS  . wisdom teeth extracted      There were no vitals filed for this visit.   Subjective Assessment - 10/06/20 1108    Subjective Pt reports he had follow up with Dr. Mardelle Matte yesterday; new bandage on knee. "They want to keep my incision dry for another couple weeks".  Pt reports the effects of anesthesia are finally wearing off.    Patient Stated Goals no pain with pivoting and walking without device    Currently in Pain? Yes    Pain Score 4     Pain Location Knee    Pain Orientation Right    Pain Descriptors / Indicators Sore    Aggravating Factors  transitioning positions (full straight to bent)    Pain Relieving Factors tylenol              [facility] PT Assessment - 10/06/20 0001      Assessment   Medical Diagnosis R unicompartmental knee replacement    Referring Provider (PT) Marchia Bond, MD    Onset Date/Surgical Date 09/22/20    Hand Dominance Right    Next MD Visit 11/02/20    Prior Therapy in acute care      AROM   AROM Assessment Site Knee    Right/Left Knee Right    Right Knee Extension -5   supine quad set   Right Knee Flexion 105   supine, heel slide (no assist)          [facility] Adult PT Treatment/Exercise - 10/06/20 0001      Knee/Hip Exercises: Aerobic   Nustep L4: 6 min for warm up.       Knee/Hip Exercises: Standing   Heel Raises Both;10 reps    Lateral Step Up Right;1 set;10 reps;Hand Hold: 1;Step Height: 4"   6" step too challenging.    Forward Step Up Right;1 set;10 reps;Hand Hold: 2;Step Height: 6"    SLS 3 sec each leg.     Other Standing Knee Exercises tandem stance x 20 sec each foot forward.       Knee/Hip Exercises: Seated   Long Arc Quad AROM;Right;1  set    Sit to [employer] 10 reps   NuStep seat, cues to control descent     Knee/Hip Exercises: Supine   Quad Sets Strengthening;Right;5 reps   5 sec hold   Straight Leg Raises Strengthening;Right;10 reps      Vasopneumatic   Number Minutes Vasopneumatic  10 minutes    Vasopnuematic Location  Knee   double bolster under LEs  Vasopneumatic Pressure Medium    Vasopneumatic Temperature  34 deg                       PT Long Term Goals - 10/06/20 1113      PT LONG TERM GOAL #1   Title The patient will be indep with HEP for R LE strengthening, flexibility.    Time 6    Period Weeks      PT LONG TERM GOAL #2   Title The patient improve R knee AROM to 4 to 115 degrees    Baseline 10 to 108    Time 6    Period Weeks      PT LONG TERM GOAL #3   Title The patient will improve R quad strength demonstrating extensor lag < or equal to 6 degrees,    Baseline -22 degrees    Time 6    Period Weeks      PT LONG TERM GOAL #4   Title The patient will improve gait speed from 1.14 ft/sec to > or equal to 1.8 ft/sec    Time 6    Period Weeks      PT LONG TERM GOAL #5   Title The patient will reduce functional limitation per FOTO from 60% to < or equal to 27%.    Time 6    Period Weeks                 Plan - 10/06/20 1145    Clinical Impression Statement Pt unable to tolerate 6" lateral step ups, 4" were tolerable.  Pt tolerated all other exercises well.  Rt knee ext ROM has improved.  He tolerated med pressure on vaso today.  Progressing towards goals.    Personal Factors and Comorbidities Comorbidity 2    Comorbidities HTN, CAD    Examination-Activity Limitations Stairs;Stand;Locomotion Level;Squat    Examination-Participation Restrictions Community Activity;Meal Prep;Driving;Yard Work    PT Frequency 2x / week    PT Duration 6 weeks    PT Treatment/Interventions ADLs/Self Care Home Management;Gait training;Stair training;Functional mobility training;Therapeutic  activities;Therapeutic exercise;Neuromuscular re-education;Manual techniques;Taping;Patient/family education;Passive range of motion;Electrical Stimulation;Cryotherapy;Vasopneumatic Device    PT Next Visit Plan progress HEP working on flexibility, LE strengthening, gait mechanics and using modalities (ice/vaso as needed).  Progress to cane.    PT Home Exercise Plan [email or web address]    Consulted and Agree with Plan of Care Patient           Patient will benefit from skilled therapeutic intervention in order to improve the following deficits and impairments:  Impaired vision/preception, Abnormal gait, Decreased range of motion, Decreased strength, Decreased activity tolerance, Decreased balance, Impaired flexibility, Hypomobility, Increased fascial restricitons  Visit Diagnosis: Acute pain of right knee  Muscle weakness (generalized)  Localized edema  Other symptoms and signs involving the musculoskeletal system     Problem List Patient Active Problem List   Diagnosis Date Noted  . S/P right unicompartmental knee replacement 09/22/2020  . Osteoarthritis of left shoulder 09/29/2015  . Precordial pain   . Abnormal stress test   . Osteoarthritis of right shoulder, primary 12/16/2014  . S/P shoulder replacement 12/16/2014  . Preop cardiovascular exam 09/17/2014  . Coronary artery disease involving native coronary artery of native heart without angina pectoris 01/29/2014  . HTN (hypertension) 01/29/2014  . Tremor 01/29/2014   Kerin Perna, PTA 10/06/20 1:07 PM  [place], [place], [address] Phone: [phone]  Fax:  [phone]  Name: Jay Bradshaw MRN: [id number] Date of Birth: 03/25/1935

## 2020-10-09 ENCOUNTER — Ambulatory Visit (INDEPENDENT_AMBULATORY_CARE_PROVIDER_SITE_OTHER): Payer: Medicare HMO | Admitting: Rehabilitative and Restorative Service Providers"

## 2020-10-09 ENCOUNTER — Other Ambulatory Visit: Payer: Self-pay

## 2020-10-09 ENCOUNTER — Encounter: Payer: Self-pay | Admitting: Rehabilitative and Restorative Service Providers"

## 2020-10-09 DIAGNOSIS — M25561 Pain in right knee: Secondary | ICD-10-CM | POA: Diagnosis not present

## 2020-10-09 DIAGNOSIS — M6281 Muscle weakness (generalized): Secondary | ICD-10-CM

## 2020-10-09 DIAGNOSIS — R29898 Other symptoms and signs involving the musculoskeletal system: Secondary | ICD-10-CM | POA: Diagnosis not present

## 2020-10-09 DIAGNOSIS — R600 Localized edema: Secondary | ICD-10-CM

## 2020-10-09 NOTE — Therapy (Signed)
[place], [place], [address] Phone: [phone]   Fax:  [phone]  Physical Therapy Treatment  Patient Details  Name: Jay Bradshaw MRN: [id number] Date of Birth: Jan 02, 1935 Referring Provider (PT): Marchia Bond, MD   Encounter Date: 10/09/2020   PT End of Session - 10/09/20 1134    Visit Number 4    Number of Visits 12    Date for PT Re-Evaluation 11/05/20    Authorization Type aetna medicare    PT Start Time 1110    PT Stop Time 1152    PT Time Calculation (min) 42 min    Activity Tolerance Patient tolerated treatment well    Behavior During Therapy [facility] for tasks assessed/performed           Past Medical History:  Diagnosis Date   Anxiety    Arthritis    Balance problem    more noticed in the dark   Complication of anesthesia    opioids causes urine retention   Constipation    takes Colace daily as needed   COPD (chronic obstructive pulmonary disease) ([place])    Coronary artery disease    takes Metoprolol daily, Prox RCA lesion, 20 %stenosed, Mid RCA lesion 20% stenosed, Dist LAD lesion 20% stenosed EF greater than 65%,, 1st mrg lesion 60% stenosed, mild non-obstructive disease in LAD and RCA, Stenosis of OM Branch appears moderate   Emphysema lung ([place])    Enlarged prostate    takes Tamsulosin daily   History of echocardiogram 09/08/2016   EF 37-62% grade 2 diastolic dysfunction mild RAE Reviewed   Hyperlipidemia    takes Lovastatin daily   Hypertension    elevated BP but not on meds   Hypothyroidism    takes Synthroid daily   Joint pain    Osteoarthritis of left shoulder 09/29/2015   Osteoarthritis of right shoulder, primary 12/16/2014   PONV (postoperative nausea and vomiting)    Productive cough    Seasonal allergies    Shortness of breath dyspnea    Tremors of nervous system    essential tremors   Urinary frequency    takes Ditropan daily     Past Surgical History:  Procedure Laterality Date   CARDIAC CATHETERIZATION N/A 09/25/2015   Procedure: Left Heart Cath and Coronary Angiography;  Surgeon: Burnell Blanks, MD;  Location: [place] [facility];  Service: Cardiovascular;  Laterality: N/A;   CARDIAC CATHETERIZATION N/A 08/29/2016   Procedure: Left Heart Cath and Coronary Angiography;  Surgeon: Burnell Blanks, MD;  Location: [place] [facility];  Service: Cardiovascular;  Laterality: N/A;   CATARACT EXTRACTION  Aug and Sep 2011   first left eye, then right eye   COLONOSCOPY  June 2008   Dr. Lurena Joiner ref by [place] N/A 11/09/2017   Procedure: CYSTOSCOPY WITH INSERTION OF UROLIFT;  Surgeon: Franchot Gallo, MD;  Location: [facility];  Service: Urology;  Laterality: N/A;   excision of fibroma  October 1994   in skin on back of neck right of midline, Dede Query ref by [place] CYST  2010   from the back, [place]  Nov 2004   from neck, right side (near location of mandible cyst), Dr Lorrine Kin ref by [place] Right 09/22/2020   Procedure: UNICOMPARTMENTAL KNEE;  Surgeon:  Marchia Bond, MD;  [facility];  Service: Orthopedics;  Laterality: Right;   PROSTATE BIOPSY  2006   Dr. Nadara Mustard [place]   SHOULDER HEMI-ARTHROPLASTY Right 12/16/2014   Procedure: SHOULDER HEMI-ARTHROPLASTY;  Surgeon: Johnny Bridge, MD;  Location: [place];  Service: Orthopedics;  Laterality: Right;   SHOULDER SURGERY Right 12/16/2014   hemi-arthroplasty    dr Mardelle Matte   thyroid adenoma removal  August 1970   right side, [facility]   TOTAL SHOULDER ARTHROPLASTY Right 12/16/2014   Procedure: RIGHT TOTAL SHOULDER ARTHROPLASTY;  Surgeon: Johnny Bridge, MD;  Location: [place];  Service: Orthopedics;  Laterality: Right;   TOTAL SHOULDER ARTHROPLASTY  Left 09/29/2015   TOTAL SHOULDER ARTHROPLASTY Left 09/29/2015   Procedure: TOTAL LEFT SHOULDER ARTHROPLASTY;  Surgeon: Marchia Bond, MD;  Location: [place];  Service: Orthopedics;  Laterality: Left;   vacuolar cyst removal     in right mandible fixed/filled, Denyce Robert ref by Synetta Shadow, DDS   wisdom teeth extracted      There were no vitals filed for this visit.   Subjective Assessment - 10/09/20 1115    Subjective The patient reports exercises are not going well at home-- he is not motivated reporting "I can't help but feel the pain is involved in that."  He still feels like he is coming down from the anesthesia.  He continues with pain that radiates form his knee and describes it as "tingling pressure".  Pain lasts for short periods of time, but happen frequently t/o the day.    Pertinent History CAD, HTN, s/p shoulder replacement, h/o L ankle ligamentous injury    Patient Stated Goals no pain with pivoting and walking without device    Currently in Pain? Yes    Pain Score 4     Pain Location Knee    Pain Orientation Right    Pain Descriptors / Indicators Sore    Pain Type Surgical pain    Pain Onset 1 to 4 weeks ago    Pain Frequency Constant    Aggravating Factors  hurts during "cellular furniture moving events" where pain radiates    Pain Relieving Factors tylenol              [facility] PT Assessment - 10/09/20 1119      Assessment   Medical Diagnosis R unicompartmental knee replacement    Referring Provider (PT) Marchia Bond, MD    Onset Date/Surgical Date 09/22/20    Hand Dominance Right    Next MD Visit 11/02/20    Prior Therapy in acute care      AROM   AROM Assessment Site Knee    Right/Left Knee Right    Right Knee Extension -4    Right Knee Flexion 105   on bike with Cherlynn June Adult PT Treatment/Exercise - 10/09/20 1119      Ambulation/Gait   Ambulation/Gait Yes    Ambulation/Gait Assistance 6: Modified  independent (Device/Increase time)    Assistive device Rolling walker;Straight cane    Ambulation Surface Level;Indoor    Gait Comments worked on gait with Cane x 200 ft initially with SBA and then to mod indep as patient got more comfortable.      Exercises   Exercises Knee/Hip      Knee/Hip Exercises: Stretches   Active Hamstring Stretch Right;Left;1 rep;30 seconds  Knee/Hip Exercises: Aerobic   Recumbent Bike bike partial revolutions for knee flexion to tolerance      Knee/Hip Exercises: Standing   Heel Raises Both;10 reps    SLS SLS near support surface working on dec'ing UE support and maintaining stance      Knee/Hip Exercises: Supine   Quad Sets Strengthening;Right;10 reps    Quad Sets Limitations with pillow under ankle    Bridges Strengthening;Both;10 reps    Straight Leg Raises Strengthening;Right;10 reps      Knee/Hip Exercises: Sidelying   Hip ABduction Strengthening;Right;10 reps                  PT Education - 10/09/20 1154    Education Details HEP adding HS stretch    Person(s) Educated Patient    Methods Explanation;Demonstration;Handout    Comprehension Returned demonstration;Verbalized understanding               PT Long Term Goals - 10/06/20 1113      PT LONG TERM GOAL #1   Title The patient will be indep with HEP for R LE strengthening, flexibility.    Time 6    Period Weeks      PT LONG TERM GOAL #2   Title The patient improve R knee AROM to 4 to 115 degrees    Baseline 10 to 108    Time 6    Period Weeks      PT LONG TERM GOAL #3   Title The patient will improve R quad strength demonstrating extensor lag < or equal to 6 degrees,    Baseline -22 degrees    Time 6    Period Weeks      PT LONG TERM GOAL #4   Title The patient will improve gait speed from 1.14 ft/sec to > or equal to 1.8 ft/sec    Time 6    Period Weeks      PT LONG TERM GOAL #5   Title The patient will reduce functional limitation per FOTO from 60% to <  or equal to 27%.    Time 6    Period Weeks                 Plan - 10/09/20 1216    Clinical Impression Statement PT recommended progression to [facility] for home environment and as he feels safe on level surfaces (like driveway, etc).  The patient improved knee extension ROM today with quad set to -3 degrees.  PT to continue to work on progression of strengthening recommending more compliance with HEP.    PT Frequency 2x / week    PT Duration 6 weeks    PT Treatment/Interventions ADLs/Self Care Home Management;Gait training;Stair training;Functional mobility training;Therapeutic activities;Therapeutic exercise;Neuromuscular re-education;Manual techniques;Taping;Patient/family education;Passive range of motion;Electrical Stimulation;Cryotherapy;Vasopneumatic Device    PT Next Visit Plan progress HEP working on flexibility, LE strengthening, gait mechanics and using modalities (ice/vaso as needed)    PT Home Exercise Plan [email or web address]    Consulted and Agree with Plan of Care Patient           Patient will benefit from skilled therapeutic intervention in order to improve the following deficits and impairments:  Impaired vision/preception, Abnormal gait, Decreased range of motion, Decreased strength, Decreased activity tolerance, Decreased balance, Impaired flexibility, Hypomobility, Increased fascial restricitons  Visit Diagnosis: Acute pain of right knee  Muscle weakness (generalized)  Localized edema  Other symptoms and signs involving the musculoskeletal system     Problem List Patient Active  Problem List   Diagnosis Date Noted   S/P right unicompartmental knee replacement 09/22/2020   Osteoarthritis of left shoulder 09/29/2015   Precordial pain    Abnormal stress test    Osteoarthritis of right shoulder, primary 12/16/2014   S/P shoulder replacement 12/16/2014   Preop cardiovascular exam 09/17/2014   Coronary artery disease involving native coronary artery of native  heart without angina pectoris 01/29/2014   HTN (hypertension) 01/29/2014   Tremor 01/29/2014    Hemby Bridge , PT 10/09/2020, 12:22 PM  [facility] [place], [place], [address] Phone: [phone]   Fax:  [phone]  Name: Jay Bradshaw MRN: [id number] Date of Birth: Apr 27, 1935

## 2020-10-09 NOTE — Patient Instructions (Signed)
Access Code: [email or web address] URL: [email or web address] Date: 10/09/2020 Prepared by: Rudell Cobb  Exercises Supine Heel Slide - 2 x daily - 7 x weekly - 1 sets - 10 reps Supine Straight Leg Raises - 2 x daily - 7 x weekly - 1 sets - 10 reps Quad Setting and Stretching - 2 x daily - 7 x weekly - 1 sets - 10 reps - 5 seconds hold Seated Ankle Pumps - 2 x daily - 7 x weekly - 1 sets - 10 reps Seated Long Arc Quad - 2 x daily - 7 x weekly - 1 sets - 10 reps Seated Hamstring Stretch with Chair - 2 x daily - 7 x weekly - 1 sets - 10 reps

## 2020-10-12 ENCOUNTER — Ambulatory Visit (INDEPENDENT_AMBULATORY_CARE_PROVIDER_SITE_OTHER): Payer: Medicare HMO | Admitting: Physical Therapy

## 2020-10-12 ENCOUNTER — Encounter: Payer: Self-pay | Admitting: Physical Therapy

## 2020-10-12 ENCOUNTER — Other Ambulatory Visit: Payer: Self-pay

## 2020-10-12 DIAGNOSIS — R29898 Other symptoms and signs involving the musculoskeletal system: Secondary | ICD-10-CM

## 2020-10-12 DIAGNOSIS — M6281 Muscle weakness (generalized): Secondary | ICD-10-CM

## 2020-10-12 DIAGNOSIS — M25561 Pain in right knee: Secondary | ICD-10-CM

## 2020-10-12 NOTE — Therapy (Signed)
[facility] [place], [place], [address] Phone: [phone]   Fax:  [phone]  Physical Therapy Treatment  Patient Details  Name: Jay Bradshaw MRN: [id number] Date of Birth: Oct 07, 1935 Referring Provider (PT): Marchia Bond, MD   Encounter Date: 10/12/2020   PT End of Session - 10/12/20 1106    Visit Number 5    Number of Visits 12    Date for PT Re-Evaluation 11/05/20    Authorization Type aetna medicare    PT Start Time 1106   pt arrived late   PT Stop Time 1145    PT Time Calculation (min) 39 min    Activity Tolerance Patient tolerated treatment well    Behavior During Therapy [facility] for tasks assessed/performed           Past Medical History:  Diagnosis Date  . Anxiety   . Arthritis   . Balance problem    more noticed in the dark  . Complication of anesthesia    opioids causes urine retention  . Constipation    takes Colace daily as needed  . COPD (chronic obstructive pulmonary disease) ([place])   . Coronary artery disease    takes Metoprolol daily, Prox RCA lesion, 20 %stenosed, Mid RCA lesion 20% stenosed, Dist LAD lesion 20% stenosed EF greater than 65%,, 1st mrg lesion 60% stenosed, mild non-obstructive disease in LAD and RCA, Stenosis of OM Branch appears moderate  . Emphysema lung ([place])   . Enlarged prostate    takes Tamsulosin daily  . History of echocardiogram 09/08/2016   EF 76-54% grade 2 diastolic dysfunction mild RAE Reviewed  . Hyperlipidemia    takes Lovastatin daily  . Hypertension    elevated BP but not on meds  . Hypothyroidism    takes Synthroid daily  . Joint pain   . Osteoarthritis of left shoulder 09/29/2015  . Osteoarthritis of right shoulder, primary 12/16/2014  . PONV (postoperative nausea and vomiting)   . Productive cough   . Seasonal allergies   . Shortness of breath dyspnea   . Tremors of nervous system    essential tremors  . Urinary frequency    takes  Ditropan daily    Past Surgical History:  Procedure Laterality Date  . CARDIAC CATHETERIZATION N/A 09/25/2015   Procedure: Left Heart Cath and Coronary Angiography;  Surgeon: Burnell Blanks, MD;  Location: [place] [facility];  Service: Cardiovascular;  Laterality: N/A;  . CARDIAC CATHETERIZATION N/A 08/29/2016   Procedure: Left Heart Cath and Coronary Angiography;  Surgeon: Burnell Blanks, MD;  Location: [place] [facility];  Service: Cardiovascular;  Laterality: N/A;  . CATARACT EXTRACTION  Aug and Sep 2011   first left eye, then right eye  . COLONOSCOPY  June 2008   Dr. Lurena Joiner ref by Ruthann Cancer  . CYSTOSCOPY WITH INSERTION OF UROLIFT N/A 11/09/2017   Procedure: CYSTOSCOPY WITH INSERTION OF UROLIFT;  Surgeon: Franchot Gallo, MD;  Location: [facility];  Service: Urology;  Laterality: N/A;  . excision of fibroma  October 1994   in skin on back of neck right of midline, Dede Query ref by Lidia Collum  . HERNIA REPAIR    . IRRIGATION AND DEBRIDEMENT SEBACEOUS CYST  2010   from the back, [facility]  . LIPOMA EXCISION  Nov 2004   from neck, right side (near location of mandible cyst), Dr Lorrine Kin ref by Rasalen  . PARTIAL KNEE ARTHROPLASTY Right 09/22/2020   Procedure:  UNICOMPARTMENTAL KNEE;  Surgeon: Marchia Bond, MD;  Location[facility];  Service: Orthopedics;  Laterality: Right;  . PROSTATE BIOPSY  2006   Dr. Leia Alf  . SHOULDER HEMI-ARTHROPLASTY Right 12/16/2014   Procedure: SHOULDER HEMI-ARTHROPLASTY;  Surgeon: Johnny Bridge, MD;  Location: [place];  Service: Orthopedics;  Laterality: Right;  . SHOULDER SURGERY Right 12/16/2014   hemi-arthroplasty    dr Mardelle Matte  . thyroid adenoma removal  August 1970   right side, O. Ptr.Schumacher/Hermann  . TOTAL SHOULDER ARTHROPLASTY Right 12/16/2014   Procedure: RIGHT TOTAL SHOULDER ARTHROPLASTY;  Surgeon: Johnny Bridge, MD;  Location: [place];  Service: Orthopedics;  Laterality: Right;  . TOTAL  SHOULDER ARTHROPLASTY Left 09/29/2015  . TOTAL SHOULDER ARTHROPLASTY Left 09/29/2015   Procedure: TOTAL LEFT SHOULDER ARTHROPLASTY;  Surgeon: Marchia Bond, MD;  Location: [place];  Service: Orthopedics;  Laterality: Left;  . vacuolar cyst removal     in right mandible fixed/filled, Denyce Robert ref by Synetta Shadow, DDS  . wisdom teeth extracted      There were no vitals filed for this visit.   Subjective Assessment - 10/12/20 1107    Subjective Pt reports he is interested in decreasing his frequency of PT appts.  He states his head is feeling clearer.  He reports he walked through [place] without any AD.  Pt continues to use RW, "because it feels safer". He feels the cane "doesn't help a lot".    Patient Stated Goals no pain with pivoting and walking without device    Currently in Pain? Yes    Pain Score 3     Pain Location Knee    Pain Orientation Right    Pain Descriptors / Indicators Sore    Aggravating Factors  nothing in particular.    Pain Relieving Factors tylenol              [facility] PT Assessment - 10/12/20 0001      Assessment   Medical Diagnosis R unicompartmental knee replacement    Referring Provider (PT) Marchia Bond, MD    Onset Date/Surgical Date 09/22/20    Hand Dominance Right    Next MD Visit 11/02/20    Prior Therapy in acute care      AROM   Right/Left Knee Right    Right Knee Flexion 115   during seated scoot           [facility] Adult PT Treatment/Exercise - 10/12/20 0001      Knee/Hip Exercises: Stretches   Passive Hamstring Stretch Right;2 reps;20 seconds    Knee: Self-Stretch to increase Flexion Right;5 reps;10 seconds   forward lunge on 12" step   Knee: Self-Stretch Limitations seated scoot x 10 sec x 5 reps    Gastroc Stretch Right;1 rep;30 seconds   runners stretch     Knee/Hip Exercises: Aerobic   Recumbent Bike partial to full slow revolutions x 5 min       Knee/Hip Exercises: Standing   Hip Abduction Stengthening;Right;Left;1 set;10  reps;Knee straight    Hip Extension Stengthening;Right;Left;1 set;10 reps    Lateral Step Up Right;1 set;10 reps;Hand Hold: 2;Step Height: 6"    Forward Step Up Right;1 set;10 reps;Hand Hold: 2;Step Height: 6"   eccentric lowering   Step Down Left;2 sets;5 reps;Hand Hold: 2;Step Height: 4";Step Height: 6"    Wall Squat 10 reps             PT Long Term Goals - 10/06/20 1113      PT LONG  TERM GOAL #1   Title The patient will be indep with HEP for R LE strengthening, flexibility.    Time 6    Period Weeks      PT LONG TERM GOAL #2   Title The patient improve R knee AROM to 4 to 115 degrees    Baseline 10 to 108    Time 6    Period Weeks      PT LONG TERM GOAL #3   Title The patient will improve R quad strength demonstrating extensor lag < or equal to 6 degrees,    Baseline -22 degrees    Time 6    Period Weeks      PT LONG TERM GOAL #4   Title The patient will improve gait speed from 1.14 ft/sec to > or equal to 1.8 ft/sec    Time 6    Period Weeks      PT LONG TERM GOAL #5   Title The patient will reduce functional limitation per FOTO from 60% to < or equal to 27%.    Time 6    Period Weeks                 Plan - 10/12/20 1145    Clinical Impression Statement Pt observed ambulating without AD during session, without any difficulty or unsteadiness.  Encouraged pt to continue ambulating in house/community with cane (if needed).  Pt tolerated all exercises well without increase in discomfort.  Rt knee ROM improved from last session. Progressing well towards goals.    Rehab Potential Good    PT Frequency 2x / week    PT Duration 6 weeks    PT Treatment/Interventions ADLs/Self Care Home Management;Gait training;Stair training;Functional mobility training;Therapeutic activities;Therapeutic exercise;Neuromuscular re-education;Manual techniques;Taping;Patient/family education;Passive range of motion;Electrical Stimulation;Cryotherapy;Vasopneumatic Device    PT Next Visit  Plan progress HEP working on flexibility, LE strengthening, gait mechanics as needed.  Progress HEP.    PT Home Exercise Plan [id number]    Consulted and Agree with Plan of Care Patient           Patient will benefit from skilled therapeutic intervention in order to improve the following deficits and impairments:  Impaired vision/preception, Abnormal gait, Decreased range of motion, Decreased strength, Decreased activity tolerance, Decreased balance, Impaired flexibility, Hypomobility, Increased fascial restricitons  Visit Diagnosis: Acute pain of right knee  Muscle weakness (generalized)  Other symptoms and signs involving the musculoskeletal system     Problem List Patient Active Problem List   Diagnosis Date Noted  . S/P right unicompartmental knee replacement 09/22/2020  . Osteoarthritis of left shoulder 09/29/2015  . Precordial pain   . Abnormal stress test   . Osteoarthritis of right shoulder, primary 12/16/2014  . S/P shoulder replacement 12/16/2014  . Preop cardiovascular exam 09/17/2014  . Coronary artery disease involving native coronary artery of native heart without angina pectoris 01/29/2014  . HTN (hypertension) 01/29/2014  . Tremor 01/29/2014   Kerin Perna, PTA 10/12/20 11:50 AM  [facility] [place], [place], [address] Phone: [phone]   Fax:  [phone]  Name: Jay Bradshaw MRN: [id number] Date of Birth: 02/20/1935

## 2020-10-14 ENCOUNTER — Encounter: Payer: Medicare HMO | Admitting: Rehabilitative and Restorative Service Providers"

## 2020-10-15 ENCOUNTER — Encounter: Payer: Self-pay | Admitting: Physical Therapy

## 2020-10-15 ENCOUNTER — Other Ambulatory Visit: Payer: Self-pay

## 2020-10-15 ENCOUNTER — Ambulatory Visit (INDEPENDENT_AMBULATORY_CARE_PROVIDER_SITE_OTHER): Payer: Medicare HMO | Admitting: Physical Therapy

## 2020-10-15 DIAGNOSIS — M6281 Muscle weakness (generalized): Secondary | ICD-10-CM

## 2020-10-15 DIAGNOSIS — M25561 Pain in right knee: Secondary | ICD-10-CM

## 2020-10-15 DIAGNOSIS — R29898 Other symptoms and signs involving the musculoskeletal system: Secondary | ICD-10-CM | POA: Diagnosis not present

## 2020-10-15 NOTE — Therapy (Signed)
[place], [place], [address] Phone: [phone]   Fax:  [phone]  Physical Therapy Treatment  Patient Details  Name: Jay Bradshaw MRN: [id number] Date of Birth: [birth year] Referring Provider (PT): Marchia Bond, MD   Encounter Date: 10/15/2020   PT End of Session - 10/15/20 1239    Visit Number 6    Number of Visits 12    Date for PT Re-Evaluation 11/05/20    Authorization Type aetna medicare    PT Start Time 1149    PT Stop Time 1230    PT Time Calculation (min) 41 min    Activity Tolerance Patient tolerated treatment well    Behavior During Therapy [facility] for tasks assessed/performed           Past Medical History:  Diagnosis Date  . Anxiety   . Arthritis   . Balance problem    more noticed in the dark  . Complication of anesthesia    opioids causes urine retention  . Constipation    takes Colace daily as needed  . COPD (chronic obstructive pulmonary disease) ([place])   . Coronary artery disease    takes Metoprolol daily, Prox RCA lesion, 20 %stenosed, Mid RCA lesion 20% stenosed, Dist LAD lesion 20% stenosed EF greater than 65%,, 1st mrg lesion 60% stenosed, mild non-obstructive disease in LAD and RCA, Stenosis of OM Branch appears moderate  . Emphysema lung ([place])   . Enlarged prostate    takes Tamsulosin daily  . History of echocardiogram 09/08/2016   EF 11-57% grade 2 diastolic dysfunction mild RAE Reviewed  . Hyperlipidemia    takes Lovastatin daily  . Hypertension    elevated BP but not on meds  . Hypothyroidism    takes Synthroid daily  . Joint pain   . Osteoarthritis of left shoulder 09/29/2015  . Osteoarthritis of right shoulder, primary 12/16/2014  . PONV (postoperative nausea and vomiting)   . Productive cough   . Seasonal allergies   . Shortness of breath dyspnea   . Tremors of nervous system    essential tremors  . Urinary frequency    takes Ditropan daily     Past Surgical History:  Procedure Laterality Date  . CARDIAC CATHETERIZATION N/A 09/25/2015   Procedure: Left Heart Cath and Coronary Angiography;  Surgeon: Burnell Blanks, MD;  Location: [place] [facility];  Service: Cardiovascular;  Laterality: N/A;  . CARDIAC CATHETERIZATION N/A 08/29/2016   Procedure: Left Heart Cath and Coronary Angiography;  Surgeon: Burnell Blanks, MD;  Location: [place] [facility];  Service: Cardiovascular;  Laterality: N/A;  . CATARACT EXTRACTION  Aug and Sep 2011   first left eye, then right eye  . COLONOSCOPY  June 2008   Dr. Lurena Joiner ref by Ruthann Cancer  . CYSTOSCOPY WITH INSERTION OF UROLIFT N/A 11/09/2017   Procedure: CYSTOSCOPY WITH INSERTION OF UROLIFT;  Surgeon: Franchot Gallo, MD;  Location: [facility];  Service: Urology;  Laterality: N/A;  . excision of fibroma  October 1994   in skin on back of neck right of midline, Dede Query ref by Lidia Collum  . HERNIA REPAIR    . IRRIGATION AND DEBRIDEMENT SEBACEOUS CYST  2010   from the back, [facility]  . LIPOMA EXCISION  Nov 2004   from neck, right side (near location of mandible cyst), Dr Lorrine Kin ref by Rasalen  . PARTIAL KNEE ARTHROPLASTY Right 09/22/2020   Procedure: UNICOMPARTMENTAL KNEE;  Surgeon:  Marchia Bond, MD;  [facility];  Service: Orthopedics;  Laterality: Right;  . PROSTATE BIOPSY  2006   Dr. Leia Alf  . SHOULDER HEMI-ARTHROPLASTY Right 12/16/2014   Procedure: SHOULDER HEMI-ARTHROPLASTY;  Surgeon: Johnny Bridge, MD;  Location: [place];  Service: Orthopedics;  Laterality: Right;  . SHOULDER SURGERY Right 12/16/2014   hemi-arthroplasty    dr Mardelle Matte  . thyroid adenoma removal  August 1970   right side, O. Ptr.Schumacher/Hermann  . TOTAL SHOULDER ARTHROPLASTY Right 12/16/2014   Procedure: RIGHT TOTAL SHOULDER ARTHROPLASTY;  Surgeon: Johnny Bridge, MD;  Location: [place];  Service: Orthopedics;  Laterality: Right;  . TOTAL SHOULDER ARTHROPLASTY  Left 09/29/2015  . TOTAL SHOULDER ARTHROPLASTY Left 09/29/2015   Procedure: TOTAL LEFT SHOULDER ARTHROPLASTY;  Surgeon: Marchia Bond, MD;  Location: [place];  Service: Orthopedics;  Laterality: Left;  . vacuolar cyst removal     in right mandible fixed/filled, Denyce Robert ref by Synetta Shadow, DDS  . wisdom teeth extracted      There were no vitals filed for this visit.   Subjective Assessment - 10/15/20 1158    Subjective Pt reports his Rt knee is now feeling better than it did prior to surgery.  He had some soreness after last session, but it has reduced.  He states he feels more clear headed.  He was able to rake leaves yesterday with less pain than before the surgery.    Patient Stated Goals no pain with pivoting and walking without device    Currently in Pain? Yes    Pain Score 2     Pain Location Knee    Pain Orientation Right    Pain Descriptors / Indicators Sore    Aggravating Factors  nothing in particular    Pain Relieving Factors tylenol              [facility] PT Assessment - 10/15/20 0001      Assessment   Medical Diagnosis R unicompartmental knee replacement    Referring Provider (PT) Marchia Bond, MD    Onset Date/Surgical Date 09/22/20    Hand Dominance Right    Next MD Visit 11/02/20    Prior Therapy in acute care            [facility] Adult PT Treatment/Exercise - 10/15/20 0001      Knee/Hip Exercises: Stretches   Knee: Self-Stretch to increase Flexion Right;1 rep;5 reps;10 seconds   seated scoot   Gastroc Stretch Right;Left;20 seconds;2 reps   runner stretch     Knee/Hip Exercises: Aerobic   Recumbent Bike full revolutions to L1 x 6 min       Knee/Hip Exercises: Standing   Heel Raises Both;1 set;15 reps    Terminal Knee Extension Strengthening;Right;1 set;10 reps    Theraband Level (Terminal Knee Extension) Level 3 (Green)    Wall Squat 10 reps;3 seconds      Knee/Hip Exercises: Supine   Bridges 1 set;10 reps      Knee/Hip Exercises: Sidelying   Hip  ABduction Strengthening;Right;1 set;10 reps      Manual Therapy   Manual Therapy Passive ROM    Passive ROM Rt knee into ext x 30 sec x 3               PT Long Term Goals - 10/15/20 1301      PT LONG TERM GOAL #1   Title The patient will be indep with HEP for R LE strengthening, flexibility.    Time 6  Period Weeks    Status On-going      PT LONG TERM GOAL #2   Title The patient improve R knee AROM to 4 to 115 degrees    Time 6    Period Weeks    Status Achieved      PT LONG TERM GOAL #3   Title The patient will improve R quad strength demonstrating extensor lag < or equal to 6 degrees,    Baseline -12    Time 6    Period Weeks    Status On-going      PT LONG TERM GOAL #4   Title The patient will improve gait speed from 1.14 ft/sec to > or equal to 1.8 ft/sec    Time 6    Period Weeks    Status On-going      PT LONG TERM GOAL #5   Title The patient will reduce functional limitation per FOTO from 60% to < or equal to 27%.    Time 6    Period Weeks    Status On-going                 Plan - 10/15/20 1300    Clinical Impression Statement Pt demonstrating improved Rt knee ROM; pt has met LTG#2.  He tolerated exercises well, without report of increase in knee pain.   Making good progress towards goals.    Rehab Potential Good    PT Frequency 2x / week    PT Duration 6 weeks    PT Treatment/Interventions ADLs/Self Care Home Management;Gait training;Stair training;Functional mobility training;Therapeutic activities;Therapeutic exercise;Neuromuscular re-education;Manual techniques;Taping;Patient/family education;Passive range of motion;Electrical Stimulation;Cryotherapy;Vasopneumatic Device    PT Next Visit Plan progress HEP working on flexibility, LE strengthening.  Measure gait speed.    PT Home Exercise Plan [id number]    Consulted and Agree with Plan of Care Patient           Patient will benefit from skilled therapeutic intervention in order to improve  the following deficits and impairments:  Impaired vision/preception, Abnormal gait, Decreased range of motion, Decreased strength, Decreased activity tolerance, Decreased balance, Impaired flexibility, Hypomobility, Increased fascial restricitons  Visit Diagnosis: Acute pain of right knee  Muscle weakness (generalized)  Other symptoms and signs involving the musculoskeletal system     Problem List Patient Active Problem List   Diagnosis Date Noted  . S/P right unicompartmental knee replacement 09/22/2020  . Osteoarthritis of left shoulder 09/29/2015  . Precordial pain   . Abnormal stress test   . Osteoarthritis of right shoulder, primary 12/16/2014  . S/P shoulder replacement 12/16/2014  . Preop cardiovascular exam 09/17/2014  . Coronary artery disease involving native coronary artery of native heart without angina pectoris 01/29/2014  . HTN (hypertension) 01/29/2014  . Tremor 01/29/2014   Kerin Perna, PTA 10/15/20 1:16 PM   [facility] [place], [place], [address] Phone: [phone]   Fax:  [phone]  Name: Jay Bradshaw MRN: [id number] Date of Birth: Aug 16, 1935

## 2020-10-27 ENCOUNTER — Other Ambulatory Visit: Payer: Self-pay

## 2020-10-27 ENCOUNTER — Ambulatory Visit (INDEPENDENT_AMBULATORY_CARE_PROVIDER_SITE_OTHER): Payer: Medicare HMO | Admitting: Physical Therapy

## 2020-10-27 ENCOUNTER — Encounter: Payer: Self-pay | Admitting: Physical Therapy

## 2020-10-27 DIAGNOSIS — M25561 Pain in right knee: Secondary | ICD-10-CM

## 2020-10-27 DIAGNOSIS — R29898 Other symptoms and signs involving the musculoskeletal system: Secondary | ICD-10-CM

## 2020-10-27 DIAGNOSIS — M6281 Muscle weakness (generalized): Secondary | ICD-10-CM

## 2020-10-27 NOTE — Therapy (Signed)
[place], [place], [address] Phone: [phone]   Fax:  [phone]  Physical Therapy Treatment  Patient Details  Name: Jay Bradshaw MRN: [id number] Date of Birth: Jan 20, 1935 Referring Provider (PT): Marchia Bond, MD   Encounter Date: 10/27/2020   PT End of Session - 10/27/20 1156    Visit Number 7    Number of Visits 12    Date for PT Re-Evaluation 11/05/20    Authorization Type aetna medicare    PT Start Time 1150    PT Stop Time 1233    PT Time Calculation (min) 43 min    Activity Tolerance Patient tolerated treatment well    Behavior During Therapy [facility] for tasks assessed/performed           Past Medical History:  Diagnosis Date  . Anxiety   . Arthritis   . Balance problem    more noticed in the dark  . Complication of anesthesia    opioids causes urine retention  . Constipation    takes Colace daily as needed  . COPD (chronic obstructive pulmonary disease) ([place])   . Coronary artery disease    takes Metoprolol daily, Prox RCA lesion, 20 %stenosed, Mid RCA lesion 20% stenosed, Dist LAD lesion 20% stenosed EF greater than 65%,, 1st mrg lesion 60% stenosed, mild non-obstructive disease in LAD and RCA, Stenosis of OM Branch appears moderate  . Emphysema lung ([place])   . Enlarged prostate    takes Tamsulosin daily  . History of echocardiogram 09/08/2016   EF 02-77% grade 2 diastolic dysfunction mild RAE Reviewed  . Hyperlipidemia    takes Lovastatin daily  . Hypertension    elevated BP but not on meds  . Hypothyroidism    takes Synthroid daily  . Joint pain   . Osteoarthritis of left shoulder 09/29/2015  . Osteoarthritis of right shoulder, primary 12/16/2014  . PONV (postoperative nausea and vomiting)   . Productive cough   . Seasonal allergies   . Shortness of breath dyspnea   . Tremors of nervous system    essential tremors  . Urinary frequency    takes Ditropan daily     Past Surgical History:  Procedure Laterality Date  . CARDIAC CATHETERIZATION N/A 09/25/2015   Procedure: Left Heart Cath and Coronary Angiography;  Surgeon: Burnell Blanks, MD;  Location: [place] [facility];  Service: Cardiovascular;  Laterality: N/A;  . CARDIAC CATHETERIZATION N/A 08/29/2016   Procedure: Left Heart Cath and Coronary Angiography;  Surgeon: Burnell Blanks, MD;  Location: [place] [facility];  Service: Cardiovascular;  Laterality: N/A;  . CATARACT EXTRACTION  Aug and Sep 2011   first left eye, then right eye  . COLONOSCOPY  June 2008   Dr. Lurena Joiner ref by Ruthann Cancer  . CYSTOSCOPY WITH INSERTION OF UROLIFT N/A 11/09/2017   Procedure: CYSTOSCOPY WITH INSERTION OF UROLIFT;  Surgeon: Franchot Gallo, MD;  [facility];  Service: Urology;  Laterality: N/A;  . excision of fibroma  October 1994   in skin on back of neck right of midline, Dede Query ref by Lidia Collum  . HERNIA REPAIR    . IRRIGATION AND DEBRIDEMENT SEBACEOUS CYST  2010   from the back, [facility]  . LIPOMA EXCISION  Nov 2004   from neck, right side (near location of mandible cyst), Dr Lorrine Kin ref by Rasalen  . PARTIAL KNEE ARTHROPLASTY Right 09/22/2020   Procedure: UNICOMPARTMENTAL KNEE;  Surgeon:  Marchia Bond, MD;  [facility];  Service: Orthopedics;  Laterality: Right;  . PROSTATE BIOPSY  2006   Dr. Leia Alf  . SHOULDER HEMI-ARTHROPLASTY Right 12/16/2014   Procedure: SHOULDER HEMI-ARTHROPLASTY;  Surgeon: Johnny Bridge, MD;  Location: [place];  Service: Orthopedics;  Laterality: Right;  . SHOULDER SURGERY Right 12/16/2014   hemi-arthroplasty    dr Mardelle Matte  . thyroid adenoma removal  August 1970   right side, [facility]. Ptr.Schumacher/Hermann  . TOTAL SHOULDER ARTHROPLASTY Right 12/16/2014   Procedure: RIGHT TOTAL SHOULDER ARTHROPLASTY;  Surgeon: Johnny Bridge, MD;  Location: [place];  Service: Orthopedics;  Laterality: Right;  . TOTAL SHOULDER ARTHROPLASTY  Left 09/29/2015  . TOTAL SHOULDER ARTHROPLASTY Left 09/29/2015   Procedure: TOTAL LEFT SHOULDER ARTHROPLASTY;  Surgeon: Marchia Bond, MD;  Location: [place];  Service: Orthopedics;  Laterality: Left;  . vacuolar cyst removal     in right mandible fixed/filled, Denyce Robert ref by Synetta Shadow, DDS  . wisdom teeth extracted      There were no vitals filed for this visit.   Subjective Assessment - 10/27/20 1154    Subjective Pt had trip to beach; worked on stairs "it worked out well".  Pt would like to be able to get up from the floor. He hasn't attempted it yet.    Currently in Pain? Yes    Pain Score 3     Pain Location Knee    Pain Orientation Right    Pain Descriptors / Indicators Sore    Aggravating Factors  ?    Pain Relieving Factors tylenol              [facility] PT Assessment - 10/27/20 0001      Assessment   Medical Diagnosis R unicompartmental knee replacement    Referring Provider (PT) Marchia Bond, MD    Onset Date/Surgical Date 09/22/20    Hand Dominance Right    Next MD Visit 11/02/20    Prior Therapy in acute care      Observation/Other Assessments   Focus on Therapeutic Outcomes (FOTO)  37% limited.       AROM   Right Knee Extension -3    Right Knee Flexion 120      Strength   Overall Strength Comments -8 deg extensor lag with LAQ.       Ambulation/Gait   Gait velocity 3.92 ft/sec             [facility] Adult PT Treatment/Exercise - 10/27/20 0001      Self-Care   Self-Care Scar Mobilizations    Scar Mobilizations educated pt regarding scar mobilization, and provided demo.  Pt verbalized understanding.       Knee/Hip Exercises: Stretches   Piriformis Stretch Right;2 reps;Left;1 rep;20 seconds   hooklying, pulling knee towards opp shoulder .     Knee/Hip Exercises: Aerobic   Recumbent Bike L1-2: 4.5 min     Other Aerobic 3 laps around gym (for gait speed measurement and to assess response to kneeling exercise)      Knee/Hip Exercises: Standing    Other Standing Knee Exercises standing to high kneeling with UE support on mat table x 3 reps (on 3" foam).Quadruped to look under table x 1 rep (kneeling on 3" foam).  kneeling on mat table to/from side sitting  to quadruped to standing x 2 reps       Knee/Hip Exercises: Seated   Long Arc Quad AROM;Right;1 set;5 reps    Sit to Sand 1 set;5  reps;with UE support;without UE support   to low mat and elevated table.      Knee/Hip Exercises: Supine   Quad Sets Limitations 1 rep of 5 sec for measurement.                         PT Long Term Goals - 10/27/20 1227      PT LONG TERM GOAL #1   Title The patient will be indep with HEP for R LE strengthening, flexibility.    Time 6    Period Weeks    Status Partially Met      PT LONG TERM GOAL #2   Title The patient improve R knee AROM to 4 to 115 degrees    Baseline 3-120 deg    Time 6    Period Weeks    Status Achieved      PT LONG TERM GOAL #3   Title The patient will improve R quad strength demonstrating extensor lag < or equal to 6 degrees,    Baseline 8 deg ext lag with LAQ.    Time 6    Period Weeks    Status On-going      PT LONG TERM GOAL #4   Title The patient will improve gait speed from 1.14 ft/sec to > or equal to 1.8 ft/sec    Baseline 3.49 ft/sec    Time 6    Period Weeks    Status Achieved      PT LONG TERM GOAL #5   Title The patient will reduce functional limitation per FOTO from 60% to < or equal to 27%.    Baseline 37% limited    Time 6    Period Weeks    Status On-going                 Plan - 10/27/20 1243    Clinical Impression Statement Pt's Rt knee ROM has improved since last visit.  He does have some extensor lag with LAQ, likely affected by his tight R hamstring.  Encouraged pt to continue to work on stretching hamstrings and straightening his knee.  Trialed standing to/from kneeling (with foam under knees)per pt request with good tolerance.  Pt has partially met his goals and may  benefit from 1-2 visits to finalize HEP.    Rehab Potential Good    PT Frequency 2x / week    PT Duration 6 weeks    PT Treatment/Interventions ADLs/Self Care Home Management;Gait training;Stair training;Functional mobility training;Therapeutic activities;Therapeutic exercise;Neuromuscular re-education;Manual techniques;Taping;Patient/family education;Passive range of motion;Electrical Stimulation;Cryotherapy;Vasopneumatic Device    PT Next Visit Plan progress HEP working on flexibility, LE strengthening.  .    PT Home Exercise Plan [email or web address]    Consulted and Agree with Plan of Care Patient           Patient will benefit from skilled therapeutic intervention in order to improve the following deficits and impairments:  Impaired vision/preception, Abnormal gait, Decreased range of motion, Decreased strength, Decreased activity tolerance, Decreased balance, Impaired flexibility, Hypomobility, Increased fascial restricitons  Visit Diagnosis: Acute pain of right knee  Muscle weakness (generalized)  Other symptoms and signs involving the musculoskeletal system     Problem List Patient Active Problem List   Diagnosis Date Noted  . S/P right unicompartmental knee replacement 09/22/2020  . Osteoarthritis of left shoulder 09/29/2015  . Precordial pain   . Abnormal stress test   . Osteoarthritis of right shoulder, primary 12/16/2014  . S/P  shoulder replacement 12/16/2014  . Preop cardiovascular exam 09/17/2014  . Coronary artery disease involving native coronary artery of native heart without angina pectoris 01/29/2014  . HTN (hypertension) 01/29/2014  . Tremor 01/29/2014   Kerin Perna, PTA 10/27/20 12:51 PM  [facility] [place], [place], [address] Phone: [phone]   Fax:  [phone]  Name: Jay Bradshaw MRN: [id number] Date of Birth: 1935-06-14

## 2020-10-30 ENCOUNTER — Encounter: Payer: Medicare HMO | Admitting: Rehabilitative and Restorative Service Providers"

## 2020-11-03 ENCOUNTER — Other Ambulatory Visit: Payer: Self-pay

## 2020-11-03 ENCOUNTER — Ambulatory Visit (INDEPENDENT_AMBULATORY_CARE_PROVIDER_SITE_OTHER): Payer: Medicare HMO | Admitting: Physical Therapy

## 2020-11-03 ENCOUNTER — Encounter: Payer: Self-pay | Admitting: Physical Therapy

## 2020-11-03 DIAGNOSIS — M6281 Muscle weakness (generalized): Secondary | ICD-10-CM | POA: Diagnosis not present

## 2020-11-03 DIAGNOSIS — M25561 Pain in right knee: Secondary | ICD-10-CM | POA: Diagnosis not present

## 2020-11-03 DIAGNOSIS — R29898 Other symptoms and signs involving the musculoskeletal system: Secondary | ICD-10-CM | POA: Diagnosis not present

## 2020-11-03 NOTE — Patient Instructions (Signed)
Access Code: [email or web address] URL: [email or web address] Date: 12/07/2021Prepared by: [facility] [place]erc[facility]  Seated Long Arc Quad - 1 x daily - 7 x weekly - 1 sets - 10 reps - 5 seconds hold  Seated Hamstring Stretch with Chair - 2 x daily - 7 x weekly - 1 sets - 2-3 reps - 30 seconds hold  Gastroc Stretch on Wall - 2 x daily - 7 x weekly - 1 sets - 2-3 reps - 15-30 seconds hold  Seated Table Piriformis Stretch - 2 x daily - 7 x weekly - 1 sets - 2-3 reps - 30 seconds hold  Sit to Stand without Arm Support - 2 x daily - 7 x weekly - 1 sets - 5 reps

## 2020-11-03 NOTE — Therapy (Addendum)
[place], [place], [address] Phone: [phone]   Fax:  [phone]  Physical Therapy Treatment and Discharge Summary  Patient Details  Name: Jay Bradshaw MRN: [id number] Date of Birth: September 09, 1935 Referring Provider (PT): Marchia Bond, MD   Encounter Date: 11/03/2020   PT End of Session - 11/03/20 1153    Visit Number 8    Number of Visits 12    Date for PT Re-Evaluation 11/05/20    Authorization Type aetna medicare    PT Start Time 1154    PT Stop Time 1230    PT Time Calculation (min) 36 min    Activity Tolerance Patient tolerated treatment well    Behavior During Therapy [facility] for tasks assessed/performed           Past Medical History:  Diagnosis Date  . Anxiety   . Arthritis   . Balance problem    more noticed in the dark  . Complication of anesthesia    opioids causes urine retention  . Constipation    takes Colace daily as needed  . COPD (chronic obstructive pulmonary disease) ([place])   . Coronary artery disease    takes Metoprolol daily, Prox RCA lesion, 20 %stenosed, Mid RCA lesion 20% stenosed, Dist LAD lesion 20% stenosed EF greater than 65%,, 1st mrg lesion 60% stenosed, mild non-obstructive disease in LAD and RCA, Stenosis of OM Branch appears moderate  . Emphysema lung ([place])   . Enlarged prostate    takes Tamsulosin daily  . History of echocardiogram 09/08/2016   EF 46-56% grade 2 diastolic dysfunction mild RAE Reviewed  . Hyperlipidemia    takes Lovastatin daily  . Hypertension    elevated BP but not on meds  . Hypothyroidism    takes Synthroid daily  . Joint pain   . Osteoarthritis of left shoulder 09/29/2015  . Osteoarthritis of right shoulder, primary 12/16/2014  . PONV (postoperative nausea and vomiting)   . Productive cough   . Seasonal allergies   . Shortness of breath dyspnea   . Tremors of nervous system    essential tremors  . Urinary frequency    takes  Ditropan daily    Past Surgical History:  Procedure Laterality Date  . CARDIAC CATHETERIZATION N/A 09/25/2015   Procedure: Left Heart Cath and Coronary Angiography;  Surgeon: Burnell Blanks, MD;  Location: [place] [facility];  Service: Cardiovascular;  Laterality: N/A;  . CARDIAC CATHETERIZATION N/A 08/29/2016   Procedure: Left Heart Cath and Coronary Angiography;  Surgeon: Burnell Blanks, MD;  Location: [place] [facility];  Service: Cardiovascular;  Laterality: N/A;  . CATARACT EXTRACTION  Aug and Sep 2011   first left eye, then right eye  . COLONOSCOPY  June 2008   Dr. Lurena Joiner ref by Ruthann Cancer  . CYSTOSCOPY WITH INSERTION OF UROLIFT N/A 11/09/2017   Procedure: CYSTOSCOPY WITH INSERTION OF UROLIFT;  Surgeon: Franchot Gallo, MD;  Location: [facility];  Service: Urology;  Laterality: N/A;  . excision of fibroma  October 1994   in skin on back of neck right of midline, Dede Query ref by Lidia Collum  . HERNIA REPAIR    . IRRIGATION AND DEBRIDEMENT SEBACEOUS CYST  2010   from the back, [facility]  . LIPOMA EXCISION  Nov 2004   from neck, right side (near location of mandible cyst), Dr Lorrine Kin ref by Rasalen  . PARTIAL KNEE ARTHROPLASTY Right 09/22/2020   Procedure: UNICOMPARTMENTAL  KNEE;  Surgeon: Marchia Bond, MD;  [facility];  Service: Orthopedics;  Laterality: Right;  . PROSTATE BIOPSY  2006   Dr. Leia Alf  . SHOULDER HEMI-ARTHROPLASTY Right 12/16/2014   Procedure: SHOULDER HEMI-ARTHROPLASTY;  Surgeon: Johnny Bridge, MD;  Location: [place];  Service: Orthopedics;  Laterality: Right;  . SHOULDER SURGERY Right 12/16/2014   hemi-arthroplasty    dr Mardelle Matte  . thyroid adenoma removal  August 1970   right side, O. Ptr.Schumacher/Hermann  . TOTAL SHOULDER ARTHROPLASTY Right 12/16/2014   Procedure: RIGHT TOTAL SHOULDER ARTHROPLASTY;  Surgeon: Johnny Bridge, MD;  Location: [place];  Service: Orthopedics;  Laterality: Right;  . TOTAL  SHOULDER ARTHROPLASTY Left 09/29/2015  . TOTAL SHOULDER ARTHROPLASTY Left 09/29/2015   Procedure: TOTAL LEFT SHOULDER ARTHROPLASTY;  Surgeon: Marchia Bond, MD;  Location: [place];  Service: Orthopedics;  Laterality: Left;  . vacuolar cyst removal     in right mandible fixed/filled, Denyce Robert ref by Synetta Shadow, DDS  . wisdom teeth extracted      There were no vitals filed for this visit.   Subjective Assessment - 11/03/20 1153    Subjective Pt reports he saw surgeon yesterday; pleased with progress. Pt reports he is progressing more rapidly now, but his is unsure if his knee is getting any straighter.  He is concerned with pain in his hip; per pt - PA suggested bursitis of hip.    Pertinent History CAD, HTN, s/p shoulder replacement, h/o L ankle ligamentous injury    Patient Stated Goals no pain with pivoting and walking without device    Currently in Pain? Yes    Pain Score 2     Pain Location Knee    Pain Orientation Right    Pain Descriptors / Indicators Dull;Aching    Aggravating Factors  ?    Pain Relieving Factors tylenol.              [facility] PT Assessment - 11/03/20 0001      Assessment   Medical Diagnosis R unicompartmental knee replacement    Referring Provider (PT) Marchia Bond, MD    Onset Date/Surgical Date 09/22/20    Hand Dominance Right    Next MD Visit early January.     Prior Therapy in acute care      Strength   Overall Strength Comments -4 deg extensor lag.             [facility] Adult PT Treatment/Exercise - 11/03/20 0001      Knee/Hip Exercises: Stretches   Passive Hamstring Stretch Right;2 reps;30 seconds;Left;20 seconds   long sitting, overpressure from patient.    Piriformis Stretch Right;2 reps;Left;1 rep;20 seconds   modified pigeon pose    Gastroc Stretch Right;Left;20 seconds;2 reps   runner stretch     Knee/Hip Exercises: Aerobic   Recumbent Bike L1-2: 5 min       Knee/Hip Exercises: Standing   Other Standing Knee Exercises standing to  high kneeling with UE support on mat table x 3 reps (on 3" foam).quadruped to kneeling on mat table to/from side sitting  to quadruped to standing x 3 reps       Knee/Hip Exercises: Seated   Long Arc Quad Right;10 reps   5 sec hold in ext    Sit to Sand 1 set;without UE support;10 reps              PT Education - 11/03/20 1239    Education Details HEP - updated  Person(s) Educated Patient    Methods Explanation;Demonstration;Tactile cues;Verbal cues;Handout    Comprehension Verbalized understanding;Returned demonstration               PT Long Term Goals - 11/03/20 1240      PT LONG TERM GOAL #1   Title The patient will be indep with HEP for R LE strengthening, flexibility.    Time 6    Period Weeks    Status Partially Met      PT LONG TERM GOAL #2   Title The patient improve R knee AROM to 4 to 115 degrees    Baseline 3-120 deg    Time 6    Period Weeks    Status Achieved      PT LONG TERM GOAL #3   Title The patient will improve R quad strength demonstrating extensor lag < or equal to 6 degrees,    Time 6    Period Weeks    Status Achieved      PT LONG TERM GOAL #4   Title The patient will improve gait speed from 1.14 ft/sec to > or equal to 1.8 ft/sec    Baseline 3.49 ft/sec    Time 6    Period Weeks    Status Achieved      PT LONG TERM GOAL #5   Title The patient will reduce functional limitation per FOTO from 60% to < or equal to 27%.    Baseline 37% limited    Time 6    Period Weeks    Status On-going                 Plan - 11/03/20 1203    Clinical Impression Statement Pt demonstrated improved knee ext during LAQ; has met LTG#3.  Continues with some tightness in Rt hip and hamstrings; will benefit from continued focus in HEP on these areas to improve functional mobility.  Pt verbalized readiness to hold PT for 30 days while he continues to work on HEP independently.    Rehab Potential Good    PT Frequency 2x / week    PT Duration 6  weeks    PT Treatment/Interventions ADLs/Self Care Home Management;Gait training;Stair training;Functional mobility training;Therapeutic activities;Therapeutic exercise;Neuromuscular re-education;Manual techniques;Taping;Patient/family education;Passive range of motion;Electrical Stimulation;Cryotherapy;Vasopneumatic Device    PT Next Visit Plan spoke to supervising PT; will hold 30 days until 12/04/20.    PT Home Exercise Plan [email or web address]    Consulted and Agree with Plan of Care Patient           Patient will benefit from skilled therapeutic intervention in order to improve the following deficits and impairments:  Impaired vision/preception, Abnormal gait, Decreased range of motion, Decreased strength, Decreased activity tolerance, Decreased balance, Impaired flexibility, Hypomobility, Increased fascial restricitons  Visit Diagnosis: Acute pain of right knee  Muscle weakness (generalized)  Other symptoms and signs involving the musculoskeletal system     Problem List Patient Active Problem List   Diagnosis Date Noted  . S/P right unicompartmental knee replacement 09/22/2020  . Osteoarthritis of left shoulder 09/29/2015  . Precordial pain   . Abnormal stress test   . Osteoarthritis of right shoulder, primary 12/16/2014  . S/P shoulder replacement 12/16/2014  . Preop cardiovascular exam 09/17/2014  . Coronary artery disease involving native coronary artery of native heart without angina pectoris 01/29/2014  . HTN (hypertension) 01/29/2014  . Tremor 01/29/2014    PHYSICAL THERAPY DISCHARGE SUMMARY  Visits from Start of Care: 8  Current functional level  related to goals / functional outcomes: See goals above   Remaining deficits: Mild ROM deficits   Education / Equipment: Home program  Plan: Patient agrees to discharge.  Patient goals were partially met. Patient is being discharged due to meeting the stated rehab goals.  ?????         Thank you for the referral of  this patient. Rudell Cobb, MPT  Kerin Perna, [place] 11/03/20 1:07 PM  [facility] [place], [place], [address] Phone: [phone]   Fax:  [phone]  Name: Jay Bradshaw MRN: [id number] Date of Birth: 1935/07/02

## 2020-12-19 ENCOUNTER — Emergency Department (HOSPITAL_COMMUNITY): Payer: Medicare HMO

## 2020-12-19 ENCOUNTER — Encounter (HOSPITAL_COMMUNITY): Payer: Self-pay | Admitting: *Deleted

## 2020-12-19 ENCOUNTER — Observation Stay (HOSPITAL_COMMUNITY)
Admission: EM | Admit: 2020-12-19 | Discharge: 2020-12-20 | Disposition: A | Discharge status: Home / Self Care | Payer: Medicare HMO | Attending: Surgery | Admitting: Surgery

## 2020-12-19 ENCOUNTER — Encounter (HOSPITAL_COMMUNITY)
Admission: EM | Disposition: A | Discharge status: Home / Self Care | Payer: Self-pay | Source: Home / Self Care | Attending: Emergency Medicine

## 2020-12-19 ENCOUNTER — Emergency Department (HOSPITAL_COMMUNITY): Payer: Medicare HMO | Admitting: Anesthesiology

## 2020-12-19 ENCOUNTER — Other Ambulatory Visit: Payer: Self-pay

## 2020-12-19 DIAGNOSIS — Z87891 Personal history of nicotine dependence: Secondary | ICD-10-CM | POA: Insufficient documentation

## 2020-12-19 DIAGNOSIS — J449 Chronic obstructive pulmonary disease, unspecified: Secondary | ICD-10-CM | POA: Diagnosis not present

## 2020-12-19 DIAGNOSIS — E785 Hyperlipidemia, unspecified: Secondary | ICD-10-CM | POA: Diagnosis not present

## 2020-12-19 DIAGNOSIS — E039 Hypothyroidism, unspecified: Secondary | ICD-10-CM | POA: Insufficient documentation

## 2020-12-19 DIAGNOSIS — Z79899 Other long term (current) drug therapy: Secondary | ICD-10-CM | POA: Diagnosis not present

## 2020-12-19 DIAGNOSIS — I10 Essential (primary) hypertension: Secondary | ICD-10-CM | POA: Diagnosis not present

## 2020-12-19 DIAGNOSIS — Z20822 Contact with and (suspected) exposure to covid-19: Secondary | ICD-10-CM | POA: Diagnosis not present

## 2020-12-19 DIAGNOSIS — K4091 Unilateral inguinal hernia, without obstruction or gangrene, recurrent: Secondary | ICD-10-CM | POA: Diagnosis not present

## 2020-12-19 DIAGNOSIS — K3580 Unspecified acute appendicitis: Secondary | ICD-10-CM | POA: Diagnosis present

## 2020-12-19 DIAGNOSIS — K3533 Acute appendicitis with perforation and localized peritonitis, with abscess: Principal | ICD-10-CM | POA: Insufficient documentation

## 2020-12-19 HISTORY — PX: LAPAROSCOPIC APPENDECTOMY: SHX408

## 2020-12-19 LAB — COMPREHENSIVE METABOLIC PANEL
ALT: 16 U/L (ref 0–44)
AST: 21 U/L (ref 15–41)
Albumin: 3.5 g/dL (ref 3.5–5.0)
Alkaline Phosphatase: 62 U/L (ref 38–126)
Anion gap: 9 (ref 5–15)
BUN: 20 mg/dL (ref 8–23)
CO2: 24 mmol/L (ref 22–32)
Calcium: 9.1 mg/dL (ref 8.9–10.3)
Chloride: 99 mmol/L (ref 98–111)
Creatinine, Ser: 1.01 mg/dL (ref 0.61–1.24)
GFR, Estimated: >60 mL/min (ref >60)
Glucose, Bld: 131 mg/dL — ABNORMAL HIGH (ref 70–99)
Potassium: 4.4 mmol/L (ref 3.5–5.1)
Sodium: 132 mmol/L — ABNORMAL LOW (ref 135–145)
Total Bilirubin: 1 mg/dL (ref 0.3–1.2)
Total Protein: 7.1 g/dL (ref 6.5–8.1)

## 2020-12-19 LAB — CBC WITH DIFFERENTIAL/PLATELET
Abs Immature Granulocytes: 0.05 10*3/uL (ref 0.00–0.07)
Basophils Absolute: 0.1 10*3/uL (ref 0.0–0.1)
Basophils Relative: 0 %
Eosinophils Absolute: 0 10*3/uL (ref 0.0–0.5)
Eosinophils Relative: 0 %
HCT: 38.9 % — ABNORMAL LOW (ref 39.0–52.0)
Hemoglobin: 12.9 g/dL — ABNORMAL LOW (ref 13.0–17.0)
Immature Granulocytes: 0 %
Lymphocytes Relative: 7 %
Lymphs Abs: 1 10*3/uL (ref 0.7–4.0)
MCH: 30.1 pg (ref 26.0–34.0)
MCHC: 33.2 g/dL (ref 30.0–36.0)
MCV: 90.9 fL (ref 80.0–100.0)
Monocytes Absolute: 1.6 10*3/uL — ABNORMAL HIGH (ref 0.1–1.0)
Monocytes Relative: 12 %
Neutro Abs: 10.5 10*3/uL — ABNORMAL HIGH (ref 1.7–7.7)
Neutrophils Relative %: 81 %
Platelets: 270 10*3/uL (ref 150–400)
RBC: 4.28 MIL/uL (ref 4.22–5.81)
RDW: 13.7 % (ref 11.5–15.5)
WBC: 13.2 10*3/uL — ABNORMAL HIGH (ref 4.0–10.5)
nRBC: 0 % (ref 0.0–0.2)

## 2020-12-19 LAB — URINALYSIS, ROUTINE W REFLEX MICROSCOPIC
Bilirubin Urine: NEGATIVE
Glucose, UA: NEGATIVE mg/dL
Hgb urine dipstick: NEGATIVE
Ketones, ur: NEGATIVE mg/dL
Leukocytes,Ua: NEGATIVE
Nitrite: NEGATIVE
Protein, ur: NEGATIVE mg/dL
Specific Gravity, Urine: 1.019 (ref 1.005–1.030)
pH: 7 (ref 5.0–8.0)

## 2020-12-19 LAB — SARS CORONAVIRUS 2 BY RT PCR (HOSPITAL ORDER, PERFORMED IN ~~LOC~~ HOSPITAL LAB): SARS Coronavirus 2: NEGATIVE

## 2020-12-19 SURGERY — APPENDECTOMY, LAPAROSCOPIC
Anesthesia: General

## 2020-12-19 MED ORDER — TRAMADOL HCL 50 MG PO TABS: 50.0000 mg | ORAL_TABLET | Freq: Four times a day (QID) | ORAL | Status: DC | PRN

## 2020-12-19 MED ORDER — SODIUM CHLORIDE 0.9 % IV SOLN
2.0000 g | INTRAVENOUS | Status: DC
  Filled 2020-12-19: qty 20

## 2020-12-19 MED ORDER — MELATONIN 3 MG PO TABS: 3.0000 mg | ORAL_TABLET | Freq: Every evening | ORAL | Status: DC | PRN

## 2020-12-19 MED ORDER — CHLORHEXIDINE GLUCONATE 0.12 % MT SOLN
OROMUCOSAL | Status: AC
  Administered 2020-12-19: 15 mL via OROMUCOSAL
  Filled 2020-12-19: qty 15

## 2020-12-19 MED ORDER — CHLORHEXIDINE GLUCONATE 0.12 % MT SOLN: 15.0000 mL | Freq: Once | OROMUCOSAL | Status: AC

## 2020-12-19 MED ORDER — METRONIDAZOLE IN NACL 5-0.79 MG/ML-% IV SOLN
500.0000 mg | Freq: Three times a day (TID) | INTRAVENOUS | Status: DC
  Administered 2020-12-20 (×2): 500 mg via INTRAVENOUS
  Filled 2020-12-19 (×2): qty 100

## 2020-12-19 MED ORDER — LACTATED RINGERS IV SOLN: INTRAVENOUS | Status: DC | PRN

## 2020-12-19 MED ORDER — KETOROLAC TROMETHAMINE 15 MG/ML IJ SOLN: 15.0000 mg | Freq: Four times a day (QID) | INTRAMUSCULAR | Status: DC | PRN

## 2020-12-19 MED ORDER — FENTANYL CITRATE (PF) 100 MCG/2ML IJ SOLN: 25.0000 ug | INTRAMUSCULAR | Status: DC | PRN

## 2020-12-19 MED ORDER — IOHEXOL 300 MG/ML  SOLN
100.0000 mL | Freq: Once | INTRAMUSCULAR | Status: AC | PRN
  Administered 2020-12-19: 100 mL via INTRAVENOUS

## 2020-12-19 MED ORDER — LEVOTHYROXINE SODIUM 88 MCG PO TABS
88.0000 ug | ORAL_TABLET | Freq: Every day | ORAL | Status: DC
  Administered 2020-12-20: 88 ug via ORAL
  Filled 2020-12-19: qty 1

## 2020-12-19 MED ORDER — EPHEDRINE 5 MG/ML INJ
INTRAVENOUS | Status: AC
  Filled 2020-12-19: qty 10

## 2020-12-19 MED ORDER — LIDOCAINE HCL (CARDIAC) PF 100 MG/5ML IV SOSY
PREFILLED_SYRINGE | INTRAVENOUS | Status: DC | PRN
  Administered 2020-12-19: 40 mg via INTRATRACHEAL

## 2020-12-19 MED ORDER — ONDANSETRON 4 MG PO TBDP: 4.0000 mg | ORAL_TABLET | Freq: Four times a day (QID) | ORAL | Status: DC | PRN

## 2020-12-19 MED ORDER — ONDANSETRON HCL 4 MG/2ML IJ SOLN
INTRAMUSCULAR | Status: AC
  Filled 2020-12-19: qty 2

## 2020-12-19 MED ORDER — PROPOFOL 10 MG/ML IV BOLUS
INTRAVENOUS | Status: DC | PRN
  Administered 2020-12-19: 120 mg via INTRAVENOUS

## 2020-12-19 MED ORDER — ACETAMINOPHEN 500 MG PO TABS
1000.0000 mg | ORAL_TABLET | Freq: Four times a day (QID) | ORAL | Status: DC
  Administered 2020-12-19 – 2020-12-20 (×4): 1000 mg via ORAL
  Filled 2020-12-19 (×4): qty 2

## 2020-12-19 MED ORDER — DOCUSATE SODIUM 100 MG PO CAPS
100.0000 mg | ORAL_CAPSULE | Freq: Two times a day (BID) | ORAL | Status: DC
  Administered 2020-12-19 – 2020-12-20 (×2): 100 mg via ORAL
  Filled 2020-12-19 (×2): qty 1

## 2020-12-19 MED ORDER — ONDANSETRON HCL 4 MG/2ML IJ SOLN
INTRAMUSCULAR | Status: DC | PRN
  Administered 2020-12-19: 4 mg via INTRAVENOUS

## 2020-12-19 MED ORDER — ARTIFICIAL TEARS OPHTHALMIC OINT
TOPICAL_OINTMENT | OPHTHALMIC | Status: AC
  Filled 2020-12-19: qty 3.5

## 2020-12-19 MED ORDER — SODIUM CHLORIDE 0.9 % IV SOLN: INTRAVENOUS | Status: DC

## 2020-12-19 MED ORDER — 0.9 % SODIUM CHLORIDE (POUR BTL) OPTIME
TOPICAL | Status: DC | PRN
  Administered 2020-12-19: 1000 mL

## 2020-12-19 MED ORDER — ROCURONIUM BROMIDE 10 MG/ML (PF) SYRINGE
PREFILLED_SYRINGE | INTRAVENOUS | Status: AC
  Filled 2020-12-19: qty 10

## 2020-12-19 MED ORDER — POLYETHYLENE GLYCOL 3350 17 G PO PACK: 17.0000 g | PACK | Freq: Every day | ORAL | Status: DC | PRN

## 2020-12-19 MED ORDER — TAMSULOSIN HCL 0.4 MG PO CAPS: 0.4000 mg | ORAL_CAPSULE | ORAL | Status: DC

## 2020-12-19 MED ORDER — FENTANYL CITRATE (PF) 250 MCG/5ML IJ SOLN
INTRAMUSCULAR | Status: AC
  Filled 2020-12-19: qty 5

## 2020-12-19 MED ORDER — SUCCINYLCHOLINE CHLORIDE 200 MG/10ML IV SOSY
PREFILLED_SYRINGE | INTRAVENOUS | Status: AC
  Filled 2020-12-19: qty 10

## 2020-12-19 MED ORDER — FENTANYL CITRATE (PF) 250 MCG/5ML IJ SOLN
INTRAMUSCULAR | Status: DC | PRN
  Administered 2020-12-19 (×5): 50 ug via INTRAVENOUS

## 2020-12-19 MED ORDER — METRONIDAZOLE IN NACL 5-0.79 MG/ML-% IV SOLN
500.0000 mg | Freq: Once | INTRAVENOUS | Status: AC
  Administered 2020-12-19: 500 mg via INTRAVENOUS
  Filled 2020-12-19: qty 100

## 2020-12-19 MED ORDER — PROPOFOL 10 MG/ML IV BOLUS
INTRAVENOUS | Status: AC
  Filled 2020-12-19: qty 20

## 2020-12-19 MED ORDER — LIDOCAINE 2% (20 MG/ML) 5 ML SYRINGE
INTRAMUSCULAR | Status: AC
  Filled 2020-12-19: qty 5

## 2020-12-19 MED ORDER — BISACODYL 10 MG RE SUPP: 10.0000 mg | Freq: Every day | RECTAL | Status: DC | PRN

## 2020-12-19 MED ORDER — SUGAMMADEX SODIUM 200 MG/2ML IV SOLN
INTRAVENOUS | Status: DC | PRN
  Administered 2020-12-19: 200 mg via INTRAVENOUS

## 2020-12-19 MED ORDER — ROCURONIUM BROMIDE 100 MG/10ML IV SOLN
INTRAVENOUS | Status: DC | PRN
  Administered 2020-12-19: 40 mg via INTRAVENOUS

## 2020-12-19 MED ORDER — SUCCINYLCHOLINE CHLORIDE 20 MG/ML IJ SOLN
INTRAMUSCULAR | Status: DC | PRN
  Administered 2020-12-19: 120 mg via INTRAVENOUS

## 2020-12-19 MED ORDER — BUPIVACAINE-EPINEPHRINE 0.25% -1:200000 IJ SOLN
INTRAMUSCULAR | Status: DC | PRN
  Administered 2020-12-19: 12 mL
  Administered 2020-12-19: 9 mL

## 2020-12-19 MED ORDER — ALBUMIN HUMAN 5 % IV SOLN
INTRAVENOUS | Status: DC | PRN
Start: 2020-12-19 — End: 2020-12-19

## 2020-12-19 MED ORDER — ONDANSETRON HCL 4 MG/2ML IJ SOLN: 4.0000 mg | Freq: Four times a day (QID) | INTRAMUSCULAR | Status: DC | PRN

## 2020-12-19 MED ORDER — SODIUM CHLORIDE 0.9 % IV SOLN
2.0000 g | Freq: Once | INTRAVENOUS | Status: AC
  Administered 2020-12-19: 2 g via INTRAVENOUS
  Filled 2020-12-19: qty 20

## 2020-12-19 MED ORDER — METOPROLOL SUCCINATE ER 25 MG PO TB24
50.0000 mg | ORAL_TABLET | Freq: Every day | ORAL | Status: DC
  Filled 2020-12-19: qty 2

## 2020-12-19 MED ORDER — METHOCARBAMOL 500 MG PO TABS: 500.0000 mg | ORAL_TABLET | Freq: Four times a day (QID) | ORAL | Status: DC | PRN

## 2020-12-19 MED ORDER — PHENYLEPHRINE 40 MCG/ML (10ML) SYRINGE FOR IV PUSH (FOR BLOOD PRESSURE SUPPORT)
PREFILLED_SYRINGE | INTRAVENOUS | Status: AC
  Filled 2020-12-19: qty 10

## 2020-12-19 MED ORDER — SODIUM CHLORIDE 0.9 % IR SOLN
Status: DC | PRN
  Administered 2020-12-19: 1000 mL

## 2020-12-19 MED ORDER — BUPIVACAINE HCL (PF) 0.25 % IJ SOLN
INTRAMUSCULAR | Status: AC
  Filled 2020-12-19: qty 30

## 2020-12-19 SURGICAL SUPPLY — 38 items
BLADE CLIPPER SURG (BLADE) ×2 IMPLANT
CANISTER SUCT 3000ML PPV (MISCELLANEOUS) ×2 IMPLANT
CHLORAPREP W/TINT 26 (MISCELLANEOUS) ×2 IMPLANT
COVER SURGICAL LIGHT HANDLE (MISCELLANEOUS) ×2 IMPLANT
COVER WAND RF STERILE (DRAPES) ×2 IMPLANT
CUTTER FLEX LINEAR 45M (STAPLE) ×2 IMPLANT
DERMABOND ADHESIVE PROPEN (GAUZE/BANDAGES/DRESSINGS) ×1
DERMABOND ADVANCED (GAUZE/BANDAGES/DRESSINGS) ×1
DERMABOND ADVANCED .7 DNX12 (GAUZE/BANDAGES/DRESSINGS) ×1 IMPLANT
DERMABOND ADVANCED .7 DNX6 (GAUZE/BANDAGES/DRESSINGS) ×1 IMPLANT
ELECT REM PT RETURN 9FT ADLT (ELECTROSURGICAL) ×2
ELECTRODE REM PT RTRN 9FT ADLT (ELECTROSURGICAL) ×1 IMPLANT
GLOVE BIO SURGEON STRL SZ 6 (GLOVE) ×2 IMPLANT
GLOVE SURG UNDER LTX SZ6.5 (GLOVE) ×2 IMPLANT
GOWN STRL REUS W/ TWL LRG LVL3 (GOWN DISPOSABLE) ×3 IMPLANT
GOWN STRL REUS W/TWL LRG LVL3 (GOWN DISPOSABLE) ×3
GRASPER SUT TROCAR 14GX15 (MISCELLANEOUS) ×2 IMPLANT
KIT BASIN OR (CUSTOM PROCEDURE TRAY) ×2 IMPLANT
KIT TURNOVER KIT B (KITS) ×2 IMPLANT
NEEDLE INSUFFLATION 14GA 120MM (NEEDLE) ×2 IMPLANT
NS IRRIG 1000ML POUR BTL (IV SOLUTION) ×2 IMPLANT
PAD ARMBOARD 7.5X6 YLW CONV (MISCELLANEOUS) ×4 IMPLANT
POUCH SPECIMEN RETRIEVAL 10MM (ENDOMECHANICALS) ×2 IMPLANT
RELOAD 45 VASCULAR/THIN (ENDOMECHANICALS) ×2 IMPLANT
RELOAD STAPLE TA45 3.5 REG BLU (ENDOMECHANICALS) ×2 IMPLANT
SET IRRIG TUBING LAPAROSCOPIC (IRRIGATION / IRRIGATOR) ×2 IMPLANT
SET TUBE SMOKE EVAC HIGH FLOW (TUBING) ×2 IMPLANT
SHEARS HARMONIC ACE PLUS 36CM (ENDOMECHANICALS) ×2 IMPLANT
SLEEVE ENDOPATH XCEL 5M (ENDOMECHANICALS) ×2 IMPLANT
SPECIMEN JAR SMALL (MISCELLANEOUS) ×2 IMPLANT
SUT MNCRL AB 4-0 PS2 18 (SUTURE) ×2 IMPLANT
TOWEL GREEN STERILE FF (TOWEL DISPOSABLE) ×2 IMPLANT
TRAY FOLEY W/BAG SLVR 16FR (SET/KITS/TRAYS/PACK) ×1
TRAY FOLEY W/BAG SLVR 16FR ST (SET/KITS/TRAYS/PACK) ×1 IMPLANT
TRAY LAPAROSCOPIC MC (CUSTOM PROCEDURE TRAY) ×2 IMPLANT
TROCAR XCEL 12X100 BLDLESS (ENDOMECHANICALS) ×2 IMPLANT
TROCAR XCEL NON-BLD 5MMX100MML (ENDOMECHANICALS) ×2 IMPLANT
WATER STERILE IRR 1000ML POUR (IV SOLUTION) ×2 IMPLANT

## 2020-12-19 NOTE — H&P (Addendum)
Surgical Evaluation  Chief Complaint: Abdominal pain  HPI: Very pleasant 86 year old gentleman who presented to the [facility] emergency department this morning with lower abdominal pain which began yesterday in the late afternoon.  This is worst in the right lower quadrant.  It has worsened over time and initially he thought that this was due to some urinary retention as he has had issues with his prostate in the past.  Reports nausea only this afternoon when he was moving around but previous to that he denies nausea or changes in bowel movements.  No fevers that he is aware of.  He did have a little bit of relief with bladder catheterization but continues to have pain in the right lower quadrant. Medical problems as listed below.  His cardiologist is Dr. Johnsie Cancel Denies any prior abdominal surgery. 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  Allergies  Allergen Reactions  . Oxycodone Other (See Comments)    Difficulty urinating for 24 plus hours    Past Medical History:  Diagnosis Date  . Anxiety   . Arthritis   . Balance problem    more noticed in the dark  . Complication of anesthesia    opioids causes urine retention  . Constipation    takes Colace daily as needed  . COPD (chronic obstructive pulmonary disease) ([place])   . Coronary artery disease    takes Metoprolol daily, Prox RCA lesion, 20 %stenosed, Mid RCA lesion 20% stenosed, Dist LAD lesion 20% stenosed EF greater than 65%,, 1st mrg lesion 60% stenosed, mild non-obstructive disease in LAD and RCA, Stenosis of OM Branch appears moderate  . Emphysema lung ([place])   . Enlarged prostate    takes Tamsulosin daily  . History of echocardiogram 09/08/2016   EF [id number] grade 2 diastolic dysfunction mild RAE Reviewed  . Hyperlipidemia    takes Lovastatin daily  . Hypertension    elevated BP but not on meds  . Hypothyroidism    takes Synthroid daily  . Joint pain   .  Osteoarthritis of left shoulder 09/29/2015  . Osteoarthritis of right shoulder, primary 12/16/2014  . PONV (postoperative nausea and vomiting)   . Productive cough   . Seasonal allergies   . Shortness of breath dyspnea   . Tremors of nervous system    essential tremors  . Urinary frequency    takes Ditropan daily    Past Surgical History:  Procedure Laterality Date  . CARDIAC CATHETERIZATION N/A 09/25/2015   Procedure: Left Heart Cath and Coronary Angiography;  Surgeon: Burnell Blanks, MD;  Location: [facility] [facility];  Service: Cardiovascular;  Laterality: N/A;  . CARDIAC CATHETERIZATION N/A 08/29/2016   Procedure: Left Heart Cath and Coronary Angiography;  Surgeon: Burnell Blanks, MD;  Location: [place] [facility];  Service: Cardiovascular;  Laterality: N/A;  . CATARACT EXTRACTION  Aug and Sep 2011   first left eye, then right eye  . COLONOSCOPY  June 2008   Dr. Lurena Joiner ref by Ruthann Cancer  . CYSTOSCOPY WITH INSERTION OF UROLIFT N/A 11/09/2017   Procedure: CYSTOSCOPY WITH INSERTION OF UROLIFT;  Surgeon: Franchot Gallo, MD;  Location: [facility];  Service: Urology;  Laterality: N/A;  . excision of fibroma  October 1994   in skin on back of neck right of midline, Dede Query ref by Lidia Collum  . HERNIA REPAIR    . [place] CYST  2010  from the back, [facility]  . LIPOMA EXCISION  Nov 2004   from neck, right side (near location of mandible cyst), Dr Lorrine Kin ref by [place]  . PARTIAL KNEE ARTHROPLASTY Right 09/22/2020   Procedure: UNICOMPARTMENTAL KNEE;  Surgeon: Marchia Bond, MD;  Location: [facility];  Service: Orthopedics;  Laterality: Right;  . PROSTATE BIOPSY  2006   Dr. Leia Alf  . SHOULDER HEMI-ARTHROPLASTY Right 12/16/2014   Procedure: SHOULDER HEMI-ARTHROPLASTY;  Surgeon: Johnny Bridge, MD;  Location: [place];  Service: Orthopedics;  Laterality: Right;  . SHOULDER SURGERY Right 12/16/2014    hemi-arthroplasty    dr Mardelle Matte  . thyroid adenoma removal  August 1970   right side, [facility]  . TOTAL SHOULDER ARTHROPLASTY Right 12/16/2014   Procedure: RIGHT TOTAL SHOULDER ARTHROPLASTY;  Surgeon: Johnny Bridge, MD;  Location: [place];  Service: Orthopedics;  Laterality: Right;  . TOTAL SHOULDER ARTHROPLASTY Left 09/29/2015  . TOTAL SHOULDER ARTHROPLASTY Left 09/29/2015   Procedure: TOTAL LEFT SHOULDER ARTHROPLASTY;  Surgeon: Marchia Bond, MD;  Location: [place];  Service: Orthopedics;  Laterality: Left;  . vacuolar cyst removal     in right mandible fixed/filled, Denyce Robert ref by Synetta Shadow, DDS  . wisdom teeth extracted      Family History  Problem Relation Age of Onset  . Hypertension Mother   . Breast cancer Mother   . Tremor Father   . Prostate cancer Brother   . Tremor Brother   . Tremor Daughter     Social History   Socioeconomic History  . Marital status: Married    Spouse name: Not on file  . Number of children: Not on file  . Years of education: Not on file  . Highest education level: Not on file  Occupational History  . Occupation: retired    Comment: Adult nurse [employer]  Tobacco Use  . Smoking status: Former Smoker    Quit date: 11/02/1971    Years since quitting: 49.1  . Smokeless tobacco: Never Used  Vaping Use  . Vaping Use: Never used  Substance and Sexual Activity  . Alcohol use: Yes    Comment: 3 drinks weekly  . Drug use: No  . Sexual activity: Never  Other Topics Concern  . Not on file  Social History Narrative  . Not on file   Social Determinants of Health   Financial Resource Strain: Not on file  Food Insecurity: Not on file  Transportation Needs: Not on file  Physical Activity: Not on file  Stress: Not on file  Social Connections: Not on file    No current facility-administered medications on file prior to encounter.   Current Outpatient Medications on File Prior to Encounter  Medication Sig  Dispense Refill  . aspirin EC 325 MG tablet Take 1 tablet (325 mg total) by mouth 2 (two) times daily. 60 tablet 0  . baclofen (LIORESAL) 10 MG tablet Take 1 tablet (10 mg total) by mouth 3 (three) times daily. As needed for muscle spasm 50 tablet 0  . beta carotene w/minerals (OCUVITE) tablet Take 1 tablet by mouth every other day. Alternate with multivitamin    . bisacodyl (DULCOLAX) 5 MG EC tablet Take 5 mg by mouth daily as needed for moderate constipation.    Marland Kitchen CALCIUM-MAGNESIUM PO Take 500-800 mg by mouth 2 (two) times daily.     . cholecalciferol (VITAMIN D) 400 UNITS TABS tablet Take 400 Units by mouth 2 (two) times daily.    Marland Kitchen  Coenzyme Q10 (CO Q10) 100 MG CAPS Take 200 mg by mouth daily.    Marland Kitchen HYDROcodone-acetaminophen (NORCO) 5-325 MG tablet Take 1-2 tablets by mouth every 6 (six) hours as needed for moderate pain or severe pain. MAXIMUM TOTAL ACETAMINOPHEN DOSE IS 4000 MG PER DAY 30 tablet 0  . hydrocortisone cream (PREPARATION H) 1 % Apply 1 application topically daily as needed for itching.    . levothyroxine (SYNTHROID, LEVOTHROID) 88 MCG tablet Take 88 mcg by mouth daily before breakfast.    . lovastatin (MEVACOR) 20 MG tablet Take 20 mg by mouth daily.     . Melatonin 3 MG TABS Take 3 mg by mouth at bedtime as needed (sleep).     . metoprolol succinate (TOPROL-XL) 50 MG 24 hr tablet Take 50 mg by mouth at bedtime.     . Multiple Vitamin (MULTIVITAMIN) capsule Take 1 capsule by mouth every other day. Alternate with Ocuvite    . nitroGLYCERIN (NITROSTAT) 0.4 MG SL tablet Place 1 tablet (0.4 mg total) under the tongue every 5 (five) minutes as needed for chest pain (up to 3 doses MAX). 25 tablet 3  . ondansetron (ZOFRAN) 4 MG tablet Take 1 tablet (4 mg total) by mouth every 8 (eight) hours as needed for nausea or vomiting. 10 tablet 0  . Potassium 99 MG TABS Take 99 mg by mouth at bedtime.     . PSYLLIUM HUSK PO Take 1 capsule by mouth 3 (three) times daily as needed (depending on meals  he ate that day). Gel caps    . saw palmetto 160 MG capsule Take 160 mg by mouth 3 (three) times daily.    . tamsulosin (FLOMAX) 0.4 MG CAPS capsule Take 0.4 mg by mouth See admin instructions. Monday midnight, Thursday morning at 8am and Saturday at 1600    . vitamin C (ASCORBIC ACID) 500 MG tablet Take 500 mg by mouth every other day. Alternate with vitamin E    . VITAMIN E BLEND PO Take 300 Units by mouth every other day. Alternate with vitamin C      Review of Systems: a complete, 10pt review of systems was completed with pertinent positives and negatives as documented in the HPI  Physical Exam: Vitals:   12/19/20 1200 12/19/20 1439  BP: (!) 142/70 132/67  Pulse: 86 87  Resp: (!) 24 20  Temp:    SpO2: 96% 98%   Gen: A&Ox3, no distress  Eyes: lids and conjunctivae normal, no icterus. Pupils equally round and reactive to light.  Neck: supple without mass or thyromegaly Chest: respiratory effort is normal. No crepitus or tenderness on palpation of the chest. Breath sounds equal.  Cardiovascular: RRR with palpable distal pulses, no pedal edema Gastrointestinal: soft, nondistended, focally tender in the right lower quadrant with voluntary guarding.  Lymphatic: no lymphadenopathy in the neck or groin Muscoloskeletal: no clubbing or cyanosis of the fingers.  Strength is symmetrical throughout.  Range of motion of bilateral upper and lower extremities normal without pain, crepitation or contracture. Neuro: cranial nerves grossly intact.  Sensation intact to light touch diffusely. Intention tremor BUE Psych: appropriate mood and affect, normal insight/judgment intact  Skin: warm and dry   CBC Latest Ref Rng & Units 12/19/2020 09/23/2020 09/15/2020  WBC 4.0 - 10.5 K/uL 13.2(H) 12.4(H) 7.0  Hemoglobin 13.0 - 17.0 g/dL 12.9(L) 12.5(L) 13.2  Hematocrit 39.0 - 52.0 % 38.9(L) 38.6(L) 41.1  Platelets 150 - 400 K/uL 270 226 247    CMP Latest Ref  Rng & Units 12/19/2020 09/23/2020 09/15/2020   Glucose 70 - 99 mg/dL 131(H) 154(H) 109(H)  BUN 8 - 23 mg/dL 20 23 23   Creatinine 0.61 - 1.24 mg/dL 1.01 0.91 0.93  Sodium 135 - 145 mmol/L 132(L) 135 140  Potassium 3.5 - 5.1 mmol/L 4.4 4.4 4.4  Chloride 98 - 111 mmol/L 99 106 105  CO2 22 - 32 mmol/L 24 23 27   Calcium 8.9 - 10.3 mg/dL 9.1 8.3(L) 9.0  Total Protein 6.5 - 8.1 g/dL 7.1 - -  Total Bilirubin 0.3 - 1.2 mg/dL 1.0 - -  Alkaline Phos 38 - 126 U/L 62 - -  AST 15 - 41 U/L 21 - -  ALT 0 - 44 U/L 16 - -    Lab Results  Component Value Date   INR 1.0 08/26/2016   INR 1.03 09/23/2015    Imaging: DG Chest 1 View  Result Date: 12/19/2020 CLINICAL DATA:  Right lower quadrant pain. Preop film for appendicitis. EXAM: CHEST  1 VIEW COMPARISON:  09/18/2015 FINDINGS: Mild hyperinflation. Right shoulder arthroplasty. Probable left shoulder arthroplasty, incompletely imaged. Midline trachea. Mild cardiomegaly. Atherosclerosis in the transverse aorta. No pneumothorax. Suspect mild left pleural thickening. Volume loss at the left lung base. Clear right lung. IMPRESSION: Pleuroparenchymal opacity at the inferior left chest, most likely technique related or possibly indicative of pleuroparenchymal scarring. Hyperinflation, without acute disease. Aortic Atherosclerosis (ICD10-I70.0). Electronically Signed   By: Abigail Miyamoto M.D.   On: 12/19/2020 15:03   CT Abdomen Pelvis W Contrast  Result Date: 12/19/2020 CLINICAL DATA:  Lower abdominal pain EXAM: CT ABDOMEN AND PELVIS WITH CONTRAST TECHNIQUE: Multidetector CT imaging of the abdomen and pelvis was performed using the standard protocol following bolus administration of intravenous contrast. CONTRAST:  168mL OMNIPAQUE IOHEXOL 300 MG/ML  SOLN COMPARISON:  None. FINDINGS: Lower chest: Lung bases are clear. Hepatobiliary: Liver is within normal limits. Gallbladder is mildly distended but unremarkable. No intrahepatic or extrahepatic ductal dilatation. Pancreas: Within normal limits. Spleen: Within  normal limits. Adrenals/Urinary Tract: Right adrenal gland is within normal limits. 13 mm left adrenal nodule (series 3/image 24), technically indeterminate but likely reflecting a benign adrenal adenoma. Kidneys are within normal limits.  No hydronephrosis. Bladder is decompressed by an indwelling Foley catheter. Stomach/Bowel: Stomach is within normal limits. No evidence of bowel obstruction. Appendix measures 15 mm in diameter (series 3/image 86), with wall thickening and periappendiceal inflammatory changes (series 3/image 84), reflecting acute appendicitis. No evidence of appendicolith. No drainable fluid collection/abscess. No free air to suggest macroscopic perforation. Mild sigmoid diverticulosis, without evidence of diverticulitis. Vascular/Lymphatic: No evidence of abdominal aortic aneurysm. Atherosclerotic calcifications of the abdominal aorta and branch vessels. No suspicious abdominopelvic lymphadenopathy. Reproductive: Prostatomegaly, with suspected BPH, and additional postsurgical changes related to suspected urolift procedure. Other: No abdominopelvic ascites. Musculoskeletal: Degenerative changes of the lumbar spine. IMPRESSION: Acute appendicitis. No evidence of macroscopic perforation. No drainable fluid collection/abscess. Additional ancillary findings as above. Electronically Signed   By: Julian Hy M.D.   On: 12/19/2020 13:24     A/P: Acute appendicitis. I recommend proceeding with laparoscopic appendectomy. We discussed the surgery including relevant anatomy, technique, risks of bleeding, infection, pain, scarring, injury to intra-abdominal structures, conversion to open surgery or more extensive resection, risk of staple line leak or delayed abscess, failure to resolve symptoms, postoperative ileus, incisional hernia, as well as general risks of DVT/PE, pneumonia, stroke, heart attack, delirium death. Questions were welcomed and answered to the patient's satisfaction. We'll proceed  to the  operating room today pending results of his COVID screening test..    Patient Active Problem List   Diagnosis Date Noted  . S/P right unicompartmental knee replacement 09/22/2020  . Osteoarthritis of left shoulder 09/29/2015  . Precordial pain   . Abnormal stress test   . Osteoarthritis of right shoulder, primary 12/16/2014  . S/P shoulder replacement 12/16/2014  . Preop cardiovascular exam 09/17/2014  . Coronary artery disease involving native coronary artery of native heart without angina pectoris 01/29/2014  . HTN (hypertension) 01/29/2014  . Tremor 01/29/2014       Romana Juniper, MD [facility]  See AMION to contact appropriate on-call provider

## 2020-12-19 NOTE — ED Triage Notes (Addendum)
C/o lower pain onset last pm , states he is having urinary retention, history of enlarged prostate. States he is able to urinate but only small amt.

## 2020-12-19 NOTE — Op Note (Signed)
Operative Report  Jay Bradshaw 86 y.o. male  [id number]  [id number]  12/19/2020  Surgeon: Clovis Riley   Procedure performed: Laparoscopic Appendectomy  Preop diagnosis: Acute appendicitis  Post-op diagnosis/intraop findings: Acute appendicitis Incidentally noted right indirect and direct hernias without any incarcerated contents  Specimens: appendix  EBL: minimal  Complications: none  Description of procedure: After obtaining informed consent the patient was brought to the operating room. Antibiotics and subcutaneous heparin were administered. SCD's were applied. General endotracheal anesthesia was initiated and a formal time-out was performed. The abdomen was prepped and draped in the usual sterile fashion and the abdomen was entered using an infraumbilical Veress needle and insufflated to 15 mmHg. A 5 mm trocar and camera were then introduced, the abdomen was inspected and there is no evidence of injury from our entry. A suprapubic 5 mm trocar and a left lower quadrant 12 mm trocar were introduced under direct visualization following infiltration with local. The patient was then placed in Trendelenburg and rotated to the left and the small bowel was reflected cephalad. The appendix was visualized: It is completely retrocecal and adherent to the adjacent cecum with slight purulent exudate.  The harmonic scalpel was used to divide the peritoneum laterally to slightly mobilized the cecum and terminal ileum.  A combination of blunt dissection and harmonic scalpel were used to free the appendix of its retroperitoneal attachments. Great care was taken to ensure no injury to surrounding retroperitoneal structures, cecum or terminal ileum.  Gradually the tip and body of the appendix were able to be freed and the base of the appendix was identified.  The mesoappendix was divided with the harmonic scalpel.  Hemostasis was ensured.  The appendix was transected from the cecum using a blue load 45  mm Endo GIA stapler.  The staple line was inspected and confirmed to be intact, viable and hemostatic.  The appendix was placed in an Endo Catch bag and removed through our 12 mm trocar site.  The right lower quadrant was again inspected for hemostasis which was confirmed.  The omentum was brought back down over the small bowel and directed to the right lower quadrant.  The 70mm trocar site in the left lower quadrant was closed with 2 interrupted 0 vicryls in the fascia under direct visualization using a PMI device. The abdomen was desufflated and all trocars removed. The skin incisions were closed with subcuticular monocryl and Dermabond. The patient was awakened, extubated and transported to the recovery room in stable condition.   All counts were correct at the completion of the case.

## 2020-12-19 NOTE — ED Notes (Signed)
Pt urinated 100 mL on his own. Placed foley catheter, and initial urine output is 163mL.

## 2020-12-19 NOTE — Progress Notes (Signed)
Patient arrived to room 12 on [address] from the PACU. Patient alert and oriented x4. Denies pain at this time. Drinking clear liquids at this time. Abdominal area has 3 puncture sites with glue, no drainage noted. Patient oriented to the room and surrounding. Talking on the phone to friends and family.

## 2020-12-19 NOTE — Anesthesia Preprocedure Evaluation (Addendum)
Anesthesia Evaluation  Patient identified by MRN, date of birth, ID band Patient awake    Reviewed: Allergy & Precautions, NPO status , Patient's Chart, lab work & pertinent test results, reviewed documented beta blocker date and time   History of Anesthesia Complications (+) PONV and history of anesthetic complications  Airway Mallampati: I  TM Distance: >3 FB Neck ROM: Full    Dental  (+) Dental Advisory Given, Teeth Intact   Pulmonary COPD, former smoker,    Pulmonary exam normal        Cardiovascular hypertension, Pt. on home beta blockers and Pt. on medications + CAD  Normal cardiovascular exam   '17 TTE - EF 60-65%, grade 2 DD, mildly dilated RA. No valvulopathy.  '17 Cath - Prox RCA lesion, 20 %stenosed. Mid RCA lesion, 20 %stenosed. Dist LAD lesion, 20 %stenosed. The left ventricular systolic function is normal. LV end diastolic pressure is normal. The left ventricular ejection fraction is greater than 65% by visual estimate. 1st Mrg lesion, 60 %stenosed.     Neuro/Psych PSYCHIATRIC DISORDERS Anxiety negative neurological ROS     GI/Hepatic negative GI ROS, Neg liver ROS,   Endo/Other  Hypothyroidism   Renal/GU negative Renal ROS     Musculoskeletal  (+) Arthritis ,   Abdominal   Peds  Hematology  (+) anemia ,   Anesthesia Other Findings Covid test negative  Reproductive/Obstetrics                           Anesthesia Physical Anesthesia Plan  ASA: III  Anesthesia Plan: General   Post-op Pain Management:    Induction: Intravenous and Rapid sequence  PONV Risk Score and Plan: 3 and Treatment may vary due to age or medical condition, Ondansetron and Dexamethasone  Airway Management Planned: Oral ETT  Additional Equipment: None  Intra-op Plan:   Post-operative Plan: Extubation in OR  Informed Consent: I have reviewed the patients History and Physical,  chart, labs and discussed the procedure including the risks, benefits and alternatives for the proposed anesthesia with the patient or authorized representative who has indicated his/her understanding and acceptance.     Dental advisory given  Plan Discussed with: CRNA and Anesthesiologist  Anesthesia Plan Comments:        Anesthesia Quick Evaluation

## 2020-12-19 NOTE — Anesthesia Postprocedure Evaluation (Signed)
Anesthesia Post Note  Patient: Jay Bradshaw  Procedure(s) Performed: APPENDECTOMY LAPAROSCOPIC (N/A )     Patient location during evaluation: PACU Anesthesia Type: General Level of consciousness: awake and alert Pain management: pain level controlled Vital Signs Assessment: post-procedure vital signs reviewed and stable Respiratory status: spontaneous breathing, nonlabored ventilation and respiratory function stable Cardiovascular status: blood pressure returned to baseline and stable Postop Assessment: no apparent nausea or vomiting Anesthetic complications: no   No complications documented.  Last Vitals:  Vitals:   12/19/20 1815 12/19/20 1845  BP: 121/61 (!) 116/55  Pulse: 87 85  Resp: 16 16  Temp: (!) 36.3 C 37.6 C  SpO2: 95% 95%    Last Pain:  Vitals:   12/19/20 1845  TempSrc: Oral  PainSc:                  Audry Pili

## 2020-12-19 NOTE — ED Provider Notes (Signed)
[facility] EMERGENCY DEPARTMENT Provider Note   CSN: [id number] Arrival date & time: 12/19/20  [id number]     History Chief Complaint  Patient presents with  . Abdominal Pain    Jay Bradshaw is a 86 y.o. male.  Patient presents with right lower quadrant pain since yesterday.  Persistent and aching in nature nonradiating.  No reports of fever no cough no vomiting or diarrhea.  Also notes some difficulty urinating which comes and goes for him has had Foley catheters in the past but currently does not use 1.  He states he is able to urinate although it is a small amount.        Past Medical History:  Diagnosis Date  . Anxiety   . Arthritis   . Balance problem    more noticed in the dark  . Complication of anesthesia    opioids causes urine retention  . Constipation    takes Colace daily as needed  . COPD (chronic obstructive pulmonary disease) ([place])   . Coronary artery disease    takes Metoprolol daily, Prox RCA lesion, 20 %stenosed, Mid RCA lesion 20% stenosed, Dist LAD lesion 20% stenosed EF greater than 65%,, 1st mrg lesion 60% stenosed, mild non-obstructive disease in LAD and RCA, Stenosis of OM Branch appears moderate  . Emphysema lung ([place])   . Enlarged prostate    takes Tamsulosin daily  . History of echocardiogram 09/08/2016   EF [id number] grade 2 diastolic dysfunction mild RAE Reviewed  . Hyperlipidemia    takes Lovastatin daily  . Hypertension    elevated BP but not on meds  . Hypothyroidism    takes Synthroid daily  . Joint pain   . Osteoarthritis of left shoulder 09/29/2015  . Osteoarthritis of right shoulder, primary 12/16/2014  . PONV (postoperative nausea and vomiting)   . Productive cough   . Seasonal allergies   . Shortness of breath dyspnea   . Tremors of nervous system    essential tremors  . Urinary frequency    takes Ditropan daily    Patient Active Problem List   Diagnosis Date Noted  . S/P right unicompartmental knee  replacement 09/22/2020  . Osteoarthritis of left shoulder 09/29/2015  . Precordial pain   . Abnormal stress test   . Osteoarthritis of right shoulder, primary 12/16/2014  . S/P shoulder replacement 12/16/2014  . Preop cardiovascular exam 09/17/2014  . Coronary artery disease involving native coronary artery of native heart without angina pectoris 01/29/2014  . HTN (hypertension) 01/29/2014  . Tremor 01/29/2014    Past Surgical History:  Procedure Laterality Date  . CARDIAC CATHETERIZATION N/A 09/25/2015   Procedure: Left Heart Cath and Coronary Angiography;  Surgeon: Burnell Blanks, MD;  Location: [place] [facility] [facility];  Service: Cardiovascular;  Laterality: N/A;  . CARDIAC CATHETERIZATION N/A 08/29/2016   Procedure: Left Heart Cath and Coronary Angiography;  Surgeon: Burnell Blanks, MD;  Location: [place] [facility];  Service: Cardiovascular;  Laterality: N/A;  . CATARACT EXTRACTION  Aug and Sep 2011   first left eye, then right eye  . COLONOSCOPY  June 2008   Dr. Lurena Joiner ref by Ruthann Cancer  . CYSTOSCOPY WITH INSERTION OF UROLIFT N/A 11/09/2017   Procedure: CYSTOSCOPY WITH INSERTION OF UROLIFT;  Surgeon: Franchot Gallo, MD;  Location: [facility];  Service: Urology;  Laterality: N/A;  . excision of fibroma  October 1994   in skin on back of neck right of midline, Dede Query  ref by [place]  . HERNIA REPAIR    . IRRIGATION AND DEBRIDEMENT SEBACEOUS CYST  2010   from the back, [facility]  . LIPOMA EXCISION  Nov 2004   from neck, right side (near location of mandible cyst), Dr Lorrine Kin ref by [place]  . PARTIAL KNEE ARTHROPLASTY Right 09/22/2020   Procedure: UNICOMPARTMENTAL KNEE;  Surgeon: Marchia Bond, MD;  Location: [facility];  Service: Orthopedics;  Laterality: Right;  . PROSTATE BIOPSY  2006   Dr. Leia Alf  . SHOULDER HEMI-ARTHROPLASTY Right 12/16/2014   Procedure: SHOULDER HEMI-ARTHROPLASTY;  Surgeon: Johnny Bridge, MD;  Location:  [place];  Service: Orthopedics;  Laterality: Right;  . SHOULDER SURGERY Right 12/16/2014   hemi-arthroplasty    dr Mardelle Matte  . thyroid adenoma removal  August 1970   right side, [facility]  . TOTAL SHOULDER ARTHROPLASTY Right 12/16/2014   Procedure: RIGHT TOTAL SHOULDER ARTHROPLASTY;  Surgeon: Johnny Bridge, MD;  Location: [place];  Service: Orthopedics;  Laterality: Right;  . TOTAL SHOULDER ARTHROPLASTY Left 09/29/2015  . TOTAL SHOULDER ARTHROPLASTY Left 09/29/2015   Procedure: TOTAL LEFT SHOULDER ARTHROPLASTY;  Surgeon: Marchia Bond, MD;  Location: [place];  Service: Orthopedics;  Laterality: Left;  . vacuolar cyst removal     in right mandible fixed/filled, Denyce Robert ref by Synetta Shadow, DDS  . wisdom teeth extracted         Family History  Problem Relation Age of Onset  . Hypertension Mother   . Breast cancer Mother   . Tremor Father   . Prostate cancer Brother   . Tremor Brother   . Tremor Daughter     Social History   Tobacco Use  . Smoking status: Former Smoker    Quit date: 11/02/1971    Years since quitting: 49.1  . Smokeless tobacco: Never Used  Vaping Use  . Vaping Use: Never used  Substance Use Topics  . Alcohol use: Yes    Comment: 3 drinks weekly  . Drug use: No    Home Medications Prior to Admission medications   Medication Sig Start Date End Date Taking? Authorizing Provider  aspirin EC 325 MG tablet Take 1 tablet (325 mg total) by mouth 2 (two) times daily. 09/23/20   Ventura Bruns, PA-C  baclofen (LIORESAL) 10 MG tablet Take 1 tablet (10 mg total) by mouth 3 (three) times daily. As needed for muscle spasm 09/23/20   Merlene Pulling K, PA-C  beta carotene w/minerals (OCUVITE) tablet Take 1 tablet by mouth every other day. Alternate with multivitamin    [provider]  bisacodyl (DULCOLAX) 5 MG EC tablet Take 5 mg by mouth daily as needed for moderate constipation.    [provider]  CALCIUM-MAGNESIUM PO Take 500-800 mg by  mouth 2 (two) times daily.     [provider]  cholecalciferol (VITAMIN D) 400 UNITS TABS tablet Take 400 Units by mouth 2 (two) times daily.    [provider]  Coenzyme Q10 (CO Q10) 100 MG CAPS Take 200 mg by mouth daily.    [provider]  HYDROcodone-acetaminophen (NORCO) 5-325 MG tablet Take 1-2 tablets by mouth every 6 (six) hours as needed for moderate pain or severe pain. MAXIMUM TOTAL ACETAMINOPHEN DOSE IS 4000 MG PER DAY 09/23/20   Merlene Pulling K, PA-C  hydrocortisone cream (PREPARATION H) 1 % Apply 1 application topically daily as needed for itching.    [provider]  levothyroxine (SYNTHROID, LEVOTHROID) 88 MCG tablet Take 88  mcg by mouth daily before breakfast.    [provider]  lovastatin (MEVACOR) 20 MG tablet Take 20 mg by mouth daily.     [provider]  Melatonin 3 MG TABS Take 3 mg by mouth at bedtime as needed (sleep).     [provider]  metoprolol succinate (TOPROL-XL) 50 MG 24 hr tablet Take 50 mg by mouth at bedtime.     [provider]  Multiple Vitamin (MULTIVITAMIN) capsule Take 1 capsule by mouth every other day. Alternate with Ocuvite    [provider]  nitroGLYCERIN (NITROSTAT) 0.4 MG SL tablet Place 1 tablet (0.4 mg total) under the tongue every 5 (five) minutes as needed for chest pain (up to 3 doses MAX). 08/17/17   Josue Hector, MD  ondansetron (ZOFRAN) 4 MG tablet Take 1 tablet (4 mg total) by mouth every 8 (eight) hours as needed for nausea or vomiting. 09/23/20   Ventura Bruns, PA-C  Potassium 99 MG TABS Take 99 mg by mouth at bedtime.     [provider]  PSYLLIUM HUSK PO Take 1 capsule by mouth 3 (three) times daily as needed (depending on meals he ate that day). Gel caps    [provider]  saw palmetto 160 MG capsule Take 160 mg by mouth 3 (three) times daily.    [provider]  tamsulosin (FLOMAX) 0.4 MG CAPS capsule Take 0.4 mg by mouth  See admin instructions. Monday midnight, Thursday morning at 8am and Saturday at 1600    [provider]  vitamin C (ASCORBIC ACID) 500 MG tablet Take 500 mg by mouth every other day. Alternate with vitamin E    [provider]  VITAMIN E BLEND PO Take 300 Units by mouth every other day. Alternate with vitamin C    [provider]    Allergies    Oxycodone  Review of Systems   Review of Systems  Constitutional: Negative for fever.  HENT: Negative for ear pain and sore throat.   Eyes: Negative for pain.  Respiratory: Negative for cough.   Cardiovascular: Negative for chest pain.  Gastrointestinal: Positive for abdominal pain.  Genitourinary: Negative for flank pain.  Musculoskeletal: Negative for back pain.  Skin: Negative for color change and rash.  Neurological: Negative for syncope.  All other systems reviewed and are negative.   Physical Exam Updated Vital Signs BP 132/67 (BP Location: Right Arm)   Pulse 87   Temp 98.2 F (36.8 C) (Oral)   Resp 20   Ht 6' (1.829 m)   Wt 81.6 kg   SpO2 98%   BMI 24.41 kg/m   Physical Exam Constitutional:      General: He is not in acute distress.    Appearance: He is well-developed.  HENT:     Head: Normocephalic.     Nose: Nose normal.  Eyes:     Extraocular Movements: Extraocular movements intact.  Cardiovascular:     Rate and Rhythm: Normal rate.  Pulmonary:     Effort: Pulmonary effort is normal.  Abdominal:     Tenderness: There is abdominal tenderness in the right lower quadrant.  Skin:    Coloration: Skin is not jaundiced.  Neurological:     Mental Status: He is alert. Mental status is at baseline.     ED Results / Procedures / Treatments   Labs (all labs ordered are listed, but only abnormal results are displayed) Labs Reviewed  CBC WITH DIFFERENTIAL/PLATELET - Abnormal;  Notable for the following components:      Result Value   WBC 13.2 (*)    Hemoglobin 12.9 (*)    HCT 38.9 (*)     Neutro Abs 10.5 (*)    Monocytes Absolute 1.6 (*)    All other components within normal limits  COMPREHENSIVE METABOLIC PANEL - Abnormal; Notable for the following components:   Sodium 132 (*)    Glucose, Bld 131 (*)    All other components within normal limits  URINE CULTURE  SARS CORONAVIRUS 2 BY RT PCR (HOSPITAL ORDER, [place] [facility])  URINALYSIS, ROUTINE W REFLEX MICROSCOPIC    EKG None  Radiology CT Abdomen Pelvis W Contrast  Result Date: 12/19/2020 CLINICAL DATA:  Lower abdominal pain EXAM: CT ABDOMEN AND PELVIS WITH CONTRAST TECHNIQUE: Multidetector CT imaging of the abdomen and pelvis was performed using the standard protocol following bolus administration of intravenous contrast. CONTRAST:  118mL OMNIPAQUE IOHEXOL 300 MG/ML  SOLN COMPARISON:  None. FINDINGS: Lower chest: Lung bases are clear. Hepatobiliary: Liver is within normal limits. Gallbladder is mildly distended but unremarkable. No intrahepatic or extrahepatic ductal dilatation. Pancreas: Within normal limits. Spleen: Within normal limits. Adrenals/Urinary Tract: Right adrenal gland is within normal limits. 13 mm left adrenal nodule (series 3/image 24), technically indeterminate but likely reflecting a benign adrenal adenoma. Kidneys are within normal limits.  No hydronephrosis. Bladder is decompressed by an indwelling Foley catheter. Stomach/Bowel: Stomach is within normal limits. No evidence of bowel obstruction. Appendix measures 15 mm in diameter (series 3/image 86), with wall thickening and periappendiceal inflammatory changes (series 3/image 84), reflecting acute appendicitis. No evidence of appendicolith. No drainable fluid collection/abscess. No free air to suggest macroscopic perforation. Mild sigmoid diverticulosis, without evidence of diverticulitis. Vascular/Lymphatic: No evidence of abdominal aortic aneurysm. Atherosclerotic calcifications of the abdominal aorta and branch vessels. No  suspicious abdominopelvic lymphadenopathy. Reproductive: Prostatomegaly, with suspected BPH, and additional postsurgical changes related to suspected urolift procedure. Other: No abdominopelvic ascites. Musculoskeletal: Degenerative changes of the lumbar spine. IMPRESSION: Acute appendicitis. No evidence of macroscopic perforation. No drainable fluid collection/abscess. Additional ancillary findings as above. Electronically Signed   By: Julian Hy M.D.   On: 12/19/2020 13:24    Procedures Procedures (including critical care time)  Medications Ordered in ED Medications  cefTRIAXone (ROCEPHIN) 2 g in sodium chloride 0.9 % 100 mL IVPB (2 g Intravenous New Bag/Given 12/19/20 1439)    And  metroNIDAZOLE (FLAGYL) IVPB 500 mg (has no administration in time range)  iohexol (OMNIPAQUE) 300 MG/ML solution 100 mL (100 mLs Intravenous Contrast Given 12/19/20 1256)    ED Course  I have reviewed the triage vital signs and the nursing notes.  Pertinent labs & imaging results that were available during my care of the patient were reviewed by me and considered in my medical decision making (see chart for details).    MDM Rules/Calculators/A&P                          Labs unremarkable urinalysis unremarkable.  CT on pelvis concerning for right lower quadrant acute appendicitis.  Medicine called for admission, surgery consulted for acute appendicitis.  Patient started on IV Rocephin and Flagyl.   Final Clinical Impression(s) / ED Diagnoses Final diagnoses:  Acute appendicitis, unspecified acute appendicitis type    Rx / DC Orders ED Discharge Orders    None       Luna Fuse, MD 12/19/20 1505

## 2020-12-19 NOTE — Transfer of Care (Signed)
Immediate Anesthesia Transfer of Care Note  Patient: Jay Bradshaw  Procedure(s) Performed: APPENDECTOMY LAPAROSCOPIC (N/A )  Patient Location: PACU  Anesthesia Type:General  Level of Consciousness: drowsy  Airway & Oxygen Therapy: Patient Spontanous Breathing and Patient connected to face mask oxygen  Post-op Assessment: Report given to RN and Post -op Vital signs reviewed and stable  Post vital signs: Reviewed and stable  Last Vitals:  Vitals Value Taken Time  BP 127/79 12/19/20 1746  Temp 36.3 C 12/19/20 1746  Pulse 94 12/19/20 1750  Resp 23 12/19/20 1750  SpO2 100 % 12/19/20 1750  Vitals shown include unvalidated device data.  Last Pain:  Vitals:   12/19/20 0906  TempSrc:   PainSc: 6          Complications: No complications documented.

## 2020-12-19 NOTE — ED Notes (Signed)
Pt taken to CT.

## 2020-12-20 ENCOUNTER — Encounter (HOSPITAL_COMMUNITY): Payer: Self-pay | Admitting: Surgery

## 2020-12-20 LAB — BASIC METABOLIC PANEL
Anion gap: 9 (ref 5–15)
BUN: 18 mg/dL (ref 8–23)
CO2: 24 mmol/L (ref 22–32)
Calcium: 8.4 mg/dL — ABNORMAL LOW (ref 8.9–10.3)
Chloride: 104 mmol/L (ref 98–111)
Creatinine, Ser: 1.04 mg/dL (ref 0.61–1.24)
GFR, Estimated: >60 mL/min (ref >60)
Glucose, Bld: 121 mg/dL — ABNORMAL HIGH (ref 70–99)
Potassium: 3.9 mmol/L (ref 3.5–5.1)
Sodium: 137 mmol/L (ref 135–145)

## 2020-12-20 LAB — MAGNESIUM: Magnesium: 2 mg/dL (ref 1.7–2.4)

## 2020-12-20 LAB — CBC
HCT: 35.8 % — ABNORMAL LOW (ref 39.0–52.0)
Hemoglobin: 11.3 g/dL — ABNORMAL LOW (ref 13.0–17.0)
MCH: 29.6 pg (ref 26.0–34.0)
MCHC: 31.6 g/dL (ref 30.0–36.0)
MCV: 93.7 fL (ref 80.0–100.0)
Platelets: 232 10*3/uL (ref 150–400)
RBC: 3.82 MIL/uL — ABNORMAL LOW (ref 4.22–5.81)
RDW: 14.1 % (ref 11.5–15.5)
WBC: 13 10*3/uL — ABNORMAL HIGH (ref 4.0–10.5)
nRBC: 0 % (ref 0.0–0.2)

## 2020-12-20 LAB — URINE CULTURE: Culture: NO GROWTH

## 2020-12-20 MED ORDER — CHLORHEXIDINE GLUCONATE CLOTH 2 % EX PADS
6.0000 | MEDICATED_PAD | Freq: Every day | CUTANEOUS | Status: DC
  Administered 2020-12-20: 6 via TOPICAL

## 2020-12-20 MED ORDER — HYDROCODONE-ACETAMINOPHEN 5-325 MG PO TABS: 1.0000 | ORAL_TABLET | Freq: Four times a day (QID) | ORAL | 0 refills | Status: DC | PRN

## 2020-12-20 MED ORDER — ONDANSETRON 4 MG PO TBDP: 4.0000 mg | ORAL_TABLET | Freq: Four times a day (QID) | ORAL | 0 refills | Status: DC | PRN

## 2020-12-20 NOTE — Discharge Instructions (Signed)
Laparoscopic Appendectomy, Adult, Care After This sheet gives you information about how to care for yourself after your procedure. Your health care provider may also give you more specific instructions. If you have problems or questions, contact your health care provider. What can I expect after the procedure? After the procedure, it is common to have:  Little energy for normal activities.  Mild pain in the area where the incisions were made.  Difficulty passing stool (constipation). This can be caused by: ? Pain medicine. ? A decrease in your activity. Follow these instructions at home: Medicines  Take over-the-counter and prescription medicines only as told by your health care provider.  If you were prescribed an antibiotic medicine, take it as told by your health care provider. Do not stop taking the antibiotic even if you start to feel better.  Do not drive or use heavy machinery while taking prescription pain medicine.  Ask your health care provider if the medicine prescribed to you can cause constipation. You may need to take steps to prevent or treat constipation, such as: ? Drink enough fluid to keep your urine pale yellow. ? Take over-the-counter or prescription medicines. ? Eat foods that are high in fiber, such as beans, whole grains, and fresh fruits and vegetables. ? Limit foods that are high in fat and processed sugars, such as fried or sweet foods. Incision care  Follow instructions from your health care provider about how to take care of your incisions. Make sure you: ? Wash your hands with soap and water before and after you change your bandage (dressing). If soap and water are not available, use hand sanitizer. ? Change your dressing as told by your health care provider. ? Leave stitches (sutures), skin glue, or adhesive strips in place. These skin closures may need to stay in place for 2 weeks or longer. If adhesive strip edges start to loosen and curl up, you may  trim the loose edges. Do not remove adhesive strips completely unless your health care provider tells you to do that.  Check your incision areas every day for signs of infection. Check for: ? Redness, swelling, or pain. ? Fluid or blood. ? Warmth. ? Pus or a bad smell.   Bathing  Keep your incisions clean and dry. Clean them as often as told by your health care provider. To do this: 1. Gently wash the incisions with soap and water. 2. Rinse the incisions with water to remove all soap. 3. Pat the incisions dry with a clean towel. Do not rub the incisions.  Do not take baths, swim, or use a hot tub for 2 weeks, or until your health care provider approves. You may take showers after 48 hours. Activity  Do not drive for 24 hours if you were given a sedative during your procedure.  Rest after the procedure. Return to your normal activities as told by your health care provider. Ask your health care provider what activities are safe for you.  For 3 weeks, or for as long as told by your health care provider: ? Do not lift anything that is heavier than 10 lb (4.5 kg), or the limit that you are told. ? Do not play contact sports.   General instructions  If you were sent home with a drain, follow instructions from your health care provider about how to care for it.  Take deep breaths. This helps to prevent your lungs from developing an infection (pneumonia).  Keep all follow-up visits as told by  your health care provider. This is important. Contact a health care provider if:  You have redness, swelling, or pain around an incision.  You have fluid or blood coming from an incision.  Your incision feels warm to the touch.  You have pus or a bad smell coming from an incision or dressing.  Your incision edges break open after your sutures have been removed.  You have increasing pain in your shoulders.  You feel dizzy or you faint.  You develop shortness of breath.  You keep feeling  nauseous or you are vomiting.  You have diarrhea or you cannot control your bowel functions.  You lose your appetite.  You develop swelling or pain in your legs.  You develop a rash. Get help right away if you have:  A fever.  Difficulty breathing.  Sharp pains in your chest. Summary  After a laparoscopic appendectomy, it is common to have little energy for normal activities, mild pain in the area of the incisions, and constipation.  Infection is the most common complication after this procedure. Follow your health care provider's instructions about caring for yourself after the procedure.  Rest after the procedure. Return to your normal activities as told by your health care provider.  Contact your health care provider if you notice signs of infection around your incisions or you develop shortness of breath. Get help right away if you have a fever, chest pain, or difficulty breathing. This information is not intended to replace advice given to you by your health care provider. Make sure you discuss any questions you have with your health care provider. Document Revised: 05/17/2018 Document Reviewed: 05/17/2018 Elsevier Patient Education  2021 [employer].

## 2020-12-20 NOTE — Progress Notes (Signed)
AVS given and reviewed with pt. Medications discussed. All questions answered to satisfaction. Pt verbalized understanding of information given. Pt escorted off the unit with all belongings via wheelchair by this RN.  

## 2020-12-20 NOTE — Discharge Summary (Signed)
Physician Discharge Summary  Patient ID: Jay Bradshaw MRN: [id number] DOB/AGE: December 31, 1934 86 y.o.  PCP: Velna Hatchet, MD  Admit date: 12/19/2020 Discharge date: 12/20/2020  Admission Diagnoses:  Acute appendicitis  Discharge Diagnoses: Appendicitis plus incidental right direct and indirect inguinal herniae without incarceration Active Problems:   Acute appendicitis   Surgery:  Laparoscopic appendectomy  Discharged Condition: improved  Hospital Course:   Had surgery on Saturday evening.  Foley left in overnight.  Removed in am.  (Patient has urolifts per Dr. Diona Fanti) Incisions OK and ready for discharge home with Vicodin for pain  Consults: none  Significant Diagnostic Studies: path pending    Discharge Exam: Blood pressure 113/64, pulse 65, temperature 98.2 F (36.8 C), temperature source Oral, resp. rate 18, height 6' (1.829 m), weight 81.6 kg, SpO2 95 %. Incisions OK.  Foley removed by me.  Asked to void before discharge.    Disposition: Discharge disposition: 01-Home or Self Care       Discharge Instructions    Call MD for:  redness, tenderness, or signs of infection (pain, swelling, redness, odor or green/yellow discharge around incision site)   Complete by: As directed    Diet - low sodium heart healthy   Complete by: As directed    Discharge instructions   Complete by: As directed    May shower tomorrow Check with urology if having difficulty with voiding.   Discharge wound care:   Complete by: As directed    May shower in the morning   Increase activity slowly   Complete by: As directed      Allergies as of 12/20/2020      Reactions   Oxycodone Other (See Comments)   Difficulty urinating for 24 plus hours      Medication List    TAKE these medications   aspirin EC 81 MG tablet Take 81 mg by mouth at bedtime. Swallow whole.   baclofen 10 MG tablet Commonly known as: LIORESAL Take 1 tablet (10 mg total) by mouth 3 (three) times daily. As  needed for muscle spasm   beta carotene w/minerals tablet Take 1 tablet by mouth every other day. Alternate with multivitamin   bisacodyl 5 MG EC tablet Commonly known as: DULCOLAX Take 5 mg by mouth daily as needed for moderate constipation.   CALCIUM-MAGNESIUM PO Take 500-800 mg by mouth 2 (two) times daily.   cholecalciferol 10 MCG (400 UNIT) Tabs tablet Commonly known as: VITAMIN D3 Take 400 Units by mouth 2 (two) times daily.   Co Q10 100 MG Caps Take 200 mg by mouth daily.   HYDROcodone-acetaminophen 5-325 MG tablet Commonly known as: NORCO/VICODIN Take 1 tablet by mouth every 6 (six) hours as needed for moderate pain. What changed:   how much to take  reasons to take this  additional instructions   hydrocortisone cream 1 % Apply 1 application topically daily as needed for itching.   levothyroxine 88 MCG tablet Commonly known as: SYNTHROID Take 88 mcg by mouth daily before breakfast.   lovastatin 20 MG tablet Commonly known as: MEVACOR Take 20 mg by mouth daily.   melatonin 3 MG Tabs tablet Take 3 mg by mouth at bedtime as needed (sleep).   metoprolol succinate 50 MG 24 hr tablet Commonly known as: TOPROL-XL Take 50 mg by mouth at bedtime.   multivitamin capsule Take 1 capsule by mouth every other day. Alternate with Ocuvite   nitroGLYCERIN 0.4 MG SL tablet Commonly known as: NITROSTAT Place 1 tablet (0.4 mg  total) under the tongue every 5 (five) minutes as needed for chest pain (up to 3 doses MAX).   ondansetron 4 MG disintegrating tablet Commonly known as: ZOFRAN-ODT Take 1 tablet (4 mg total) by mouth every 6 (six) hours as needed for nausea.   ondansetron 4 MG tablet Commonly known as: Zofran Take 1 tablet (4 mg total) by mouth every 8 (eight) hours as needed for nausea or vomiting.   Potassium 99 MG Tabs Take 99 mg by mouth at bedtime.   PSYLLIUM HUSK PO Take 1 capsule by mouth 3 (three) times daily as needed (depending on meals he ate  that day). Gel caps   saw palmetto 160 MG capsule Take 160 mg by mouth 3 (three) times daily.   tamsulosin 0.4 MG Caps capsule Commonly known as: FLOMAX Take 0.4 mg by mouth See admin instructions. Monday midnight, Thursday morning at 8am and Saturday at 1600   vitamin C 500 MG tablet Commonly known as: ASCORBIC ACID Take 500 mg by mouth every other day. Alternate with vitamin E   VITAMIN E BLEND PO Take 300 Units by mouth every other day. Alternate with vitamin C            Discharge Care Instructions  (From admission, onward)         Start     Ordered   12/20/20 0000  Discharge wound care:       Comments: May shower in the morning   12/20/20 1003          Follow-up Information    Clovis Riley, MD. Schedule an appointment as soon as possible for a visit in 3 week(s).   Specialty: General Surgery Contact information: [address] [place] [address] [phone]               Signed: Pedro Earls 12/20/2020, 10:04 AM

## 2020-12-20 NOTE — Plan of Care (Signed)
  Problem: Education: Goal: Knowledge of General Education information will improve Description: Including pain rating scale, medication(s)/side effects and non-pharmacologic comfort measures Outcome: Progressing   Problem: Health Behavior/Discharge Planning: Goal: Ability to manage health-related needs will improve Outcome: Progressing   Problem: Clinical Measurements: Goal: Ability to maintain clinical measurements within normal limits will improve Outcome: Progressing Goal: Respiratory complications will improve Outcome: Progressing   Problem: Activity: Goal: Risk for activity intolerance will decrease Outcome: Progressing   Problem: Nutrition: Goal: Adequate nutrition will be maintained Outcome: Progressing   Problem: Elimination: Goal: Will not experience complications related to urinary retention Outcome: Progressing   Problem: Pain Managment: Goal: General experience of comfort will improve Outcome: Progressing   Problem: Safety: Goal: Ability to remain free from injury will improve Outcome: Progressing   Problem: Skin Integrity: Goal: Risk for impaired skin integrity will decrease Outcome: Progressing

## 2020-12-22 LAB — SURGICAL PATHOLOGY

## 2021-02-03 ENCOUNTER — Ambulatory Visit: Payer: Medicare HMO | Admitting: Cardiovascular Disease

## 2021-02-07 NOTE — Progress Notes (Signed)
Cardiology Office Note    Date:  02/19/2021   ID:  Jay Bradshaw, DOB 1935/07/03, MRN [id number]  PCP:  Velna Hatchet, MD  Cardiologist:  Dr. Johnsie Cancel  CC: follow up   History of Present Illness:  Jay Bradshaw is a 86 y.o. male with a history of non obstructive CAD, emphysema, essential tremor, hypothyroidism who presents to clinic for cardiology follow up.  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 He moved to [place] from [place]. He established care with me  in 08/2015. He had reported having a cath in 2008 with no interventions.  LHC on 09/25/15 showed 20% prox RCA, 20% mRCA, 20% dLAD, 60% 1st Marg with moderate eccentric stenosis that did not appear to be flow limiting. Per cath report, PCI would be straight forward to do if he had chest pain c/w angina moving forward. Subsequently had uncomplicated left shoulder surgery Dr Mardelle Matte 10/02/2015   Seen by PA 08/2016 with chest pain Cath set up with Dr Angelena Form  Reviewed films: moderate disease in small OM with negative  FFR medical Rx   Some stress from wifes failing health No chest pain.   Daughter lives in [place] And has 14 yo twins  Echo: 09/08/16 EF 40-98% grade 2 diastolic dysfunction mild RAE Reviewed  Had right TKR Dr Alvan Dame 12/19/20 with no cardiac complications Had appendicitis with surgery 12/2020  No cardiac complaints    Past Medical History:  Diagnosis Date  . Anxiety   . Arthritis   . Balance problem    more noticed in the dark  . Complication of anesthesia    opioids causes urine retention  . Constipation    takes Colace daily as needed  . COPD (chronic obstructive pulmonary disease) ([place])   . Coronary artery disease    takes Metoprolol daily, Prox RCA lesion, 20 %stenosed, Mid RCA lesion 20% stenosed, Dist LAD lesion 20% stenosed EF greater than 65%,, 1st mrg lesion 60% stenosed, mild non-obstructive disease in LAD and RCA, Stenosis of OM Branch appears moderate  . Emphysema lung ([place])   . Enlarged  prostate    takes Tamsulosin daily  . History of echocardiogram 09/08/2016   EF 11-91% grade 2 diastolic dysfunction mild RAE Reviewed  . Hyperlipidemia    takes Lovastatin daily  . Hypertension    elevated BP but not on meds  . Hypothyroidism    takes Synthroid daily  . Joint pain   . Osteoarthritis of left shoulder 09/29/2015  . Osteoarthritis of right shoulder, primary 12/16/2014  . PONV (postoperative nausea and vomiting)   . Productive cough   . Seasonal allergies   . Shortness of breath dyspnea   . Tremors of nervous system    essential tremors  . Urinary frequency    takes Ditropan daily    Past Surgical History:  Procedure Laterality Date  . CARDIAC CATHETERIZATION N/A 09/25/2015   Procedure: Left Heart Cath and Coronary Angiography;  Surgeon: Burnell Blanks, MD;  Location: [facility];  Service: Cardiovascular;  Laterality: N/A;  . CARDIAC CATHETERIZATION N/A 08/29/2016   Procedure: Left Heart Cath and Coronary Angiography;  Surgeon: Burnell Blanks, MD;  Location: [facility];  Service: Cardiovascular;  Laterality: N/A;  . CATARACT EXTRACTION  Aug and Sep 2011   first left eye, then right eye  . COLONOSCOPY  June 2008   Dr. Lurena Joiner ref by Ruthann Cancer  . CYSTOSCOPY WITH INSERTION OF UROLIFT N/A 11/09/2017   Procedure: CYSTOSCOPY  WITH INSERTION OF UROLIFT;  Surgeon: Franchot Gallo, MD;  Location: [facility];  Service: Urology;  Laterality: N/A;  . excision of fibroma  October 1994   in skin on back of neck right of midline, Dede Query ref by Lidia Collum  . HERNIA REPAIR    . IRRIGATION AND DEBRIDEMENT SEBACEOUS CYST  2010   from the back, [facility]  . LAPAROSCOPIC APPENDECTOMY N/A 12/19/2020   Procedure: APPENDECTOMY LAPAROSCOPIC;  Surgeon: Clovis Riley, MD;  Location: [facility];  Service: General;  Laterality: N/A;  . LIPOMA EXCISION  Nov 2004   from neck, right side (near location of mandible cyst), Dr Lorrine Kin  ref by Rasalen  . PARTIAL KNEE ARTHROPLASTY Right 09/22/2020   Procedure: UNICOMPARTMENTAL KNEE;  Surgeon: Marchia Bond, MD;  Location: [facility];  Service: Orthopedics;  Laterality: Right;  . PROSTATE BIOPSY  2006   Dr. Leia Alf  . SHOULDER HEMI-ARTHROPLASTY Right 12/16/2014   Procedure: SHOULDER HEMI-ARTHROPLASTY;  Surgeon: Johnny Bridge, MD;  Location: [facility];  Service: Orthopedics;  Laterality: Right;  . SHOULDER SURGERY Right 12/16/2014   hemi-arthroplasty    dr Mardelle Matte  . thyroid adenoma removal  August 1970   right side, [facility]  . TOTAL SHOULDER ARTHROPLASTY Right 12/16/2014   Procedure: RIGHT TOTAL SHOULDER ARTHROPLASTY;  Surgeon: Johnny Bridge, MD;  Location: [facility];  Service: Orthopedics;  Laterality: Right;  . TOTAL SHOULDER ARTHROPLASTY Left 09/29/2015  . TOTAL SHOULDER ARTHROPLASTY Left 09/29/2015   Procedure: TOTAL LEFT SHOULDER ARTHROPLASTY;  Surgeon: Marchia Bond, MD;  Location: [facility];  Service: Orthopedics;  Laterality: Left;  . vacuolar cyst removal     in right mandible fixed/filled, Denyce Robert ref by Synetta Shadow, DDS  . wisdom teeth extracted      Current Medications: Outpatient Medications Prior to Visit  Medication Sig Dispense Refill  . aspirin EC 81 MG tablet Take 81 mg by mouth at bedtime. Swallow whole.    . baclofen (LIORESAL) 10 MG tablet Take 1 tablet (10 mg total) by mouth 3 (three) times daily. As needed for muscle spasm 50 tablet 0  . beta carotene w/minerals (OCUVITE) tablet Take 1 tablet by mouth every other day. Alternate with multivitamin    . bisacodyl (DULCOLAX) 5 MG EC tablet Take 5 mg by mouth daily as needed for moderate constipation.    Marland Kitchen CALCIUM-MAGNESIUM PO Take 500-800 mg by mouth 2 (two) times daily.     . cholecalciferol (VITAMIN D) 400 UNITS TABS tablet Take 400 Units by mouth 2 (two) times daily.    . Coenzyme Q10 (CO Q10) 100 MG CAPS Take 200 mg by mouth daily.    Marland Kitchen HYDROcodone-acetaminophen (NORCO/VICODIN)  5-325 MG tablet Take 1 tablet by mouth every 6 (six) hours as needed for moderate pain. 15 tablet 0  . hydrocortisone cream 1 % Apply 1 application topically daily as needed for itching.    . levothyroxine (SYNTHROID, LEVOTHROID) 88 MCG tablet Take 88 mcg by mouth daily before breakfast.    . lovastatin (MEVACOR) 20 MG tablet Take 20 mg by mouth daily.     . Melatonin 3 MG TABS Take 3 mg by mouth at bedtime as needed (sleep).     . metoprolol succinate (TOPROL-XL) 50 MG 24 hr tablet Take 50 mg by mouth at bedtime.     . Multiple Vitamin (MULTIVITAMIN) capsule Take 1 capsule by mouth every other day. Alternate with Ocuvite    . nitroGLYCERIN (NITROSTAT) 0.4 MG SL  tablet Place 1 tablet (0.4 mg total) under the tongue every 5 (five) minutes as needed for chest pain (up to 3 doses MAX). 25 tablet 3  . ondansetron (ZOFRAN) 4 MG tablet Take 1 tablet (4 mg total) by mouth every 8 (eight) hours as needed for nausea or vomiting. 10 tablet 0  . ondansetron (ZOFRAN-ODT) 4 MG disintegrating tablet Take 1 tablet (4 mg total) by mouth every 6 (six) hours as needed for nausea. 20 tablet 0  . Potassium 99 MG TABS Take 99 mg by mouth at bedtime.     . PSYLLIUM HUSK PO Take 1 capsule by mouth 3 (three) times daily as needed (depending on meals he ate that day). Gel caps    . saw palmetto 160 MG capsule Take 160 mg by mouth 3 (three) times daily.    . tamsulosin (FLOMAX) 0.4 MG CAPS capsule Take 0.4 mg by mouth See admin instructions. Monday midnight, Thursday morning at 8am and Saturday at 1600    . vitamin C (ASCORBIC ACID) 500 MG tablet Take 500 mg by mouth every other day. Alternate with vitamin E    . VITAMIN E BLEND PO Take 300 Units by mouth every other day. Alternate with vitamin C     No facility-administered medications prior to visit.     Allergies:   Oxycodone   Social History   Socioeconomic History  . Marital status: Married    Spouse name: Not on file  . Number of children: Not on file  .  Years of education: Not on file  . Highest education level: Not on file  Occupational History  . Occupation: retired    Comment: Adult nurse [employer]  Tobacco Use  . Smoking status: Former Smoker    Quit date: 11/02/1971    Years since quitting: 49.3  . Smokeless tobacco: Never Used  Vaping Use  . Vaping Use: Never used  Substance and Sexual Activity  . Alcohol use: Yes    Comment: 3 drinks weekly  . Drug use: No  . Sexual activity: Never  Other Topics Concern  . Not on file  Social History Narrative  . Not on file   Social Determinants of Health   Financial Resource Strain: Not on file  Food Insecurity: Not on file  Transportation Needs: Not on file  Physical Activity: Not on file  Stress: Not on file  Social Connections: Not on file     Family History:  The patient's family history includes Breast cancer in his mother; Hypertension in his mother; Prostate cancer in his brother; Tremor in his brother, daughter, and father.     ROS:   Please see the history of present illness.    ROS All other systems reviewed and are negative.   PHYSICAL EXAM:   VS:  BP (!) 124/56   Pulse 64   Ht 6' (1.829 m)   Wt 88.5 kg   SpO2 97%   BMI 26.45 kg/m    Affect appropriate Healthy:  appears stated age 53: normal Neck supple with no adenopathy JVP normal no bruits no thyromegaly Lungs clear with no wheezing and good diaphragmatic motion Heart:  S1/S2 no murmur, no rub, gallop or click PMI normal Abdomen: benighn, BS positve, post RLQ appendectomy  Distal pulses intact with no bruits Trace LE  edema Neuro non-focal essential UE tremors  Post right TKR   Wt Readings from Last 3 Encounters:  02/19/21 88.5 kg  12/19/20 81.6 kg  09/22/20 84.4  kg      Studies/Labs Reviewed:   EKG:  SR rate 61 normal 08/05/20  Recent Labs: 12/19/2020: ALT 16 12/20/2020: BUN 18; Creatinine, Ser 1.04; Hemoglobin 11.3; Magnesium 2.0; Platelets 232; Potassium 3.9; Sodium 137    Lipid Panel No results found for: CHOL, TRIG, HDL, CHOLHDL, VLDL, LDLCALC, LDLDIRECT  Additional studies/ records that were reviewed today include:  Myoview 09/21/16 Study Highlights   The left ventricular ejection fraction is normal (55-65%).  Nuclear stress EF: 65%.  ST segment depression was noted during stress in the II, III, aVF, V5 and V6 leads.  This is a low risk study.   Low risk stress nuclear study with ECG changes; small, moderate intensity, reversible inferior basal defect consistent with mild inferior ischemia; EF 65 with normal wall motion.      Cardiac catheterization 08/29/16  Conclusion    Prox RCA lesion, 20% stenosed.  Mid RCA lesion, 20% stenosed.  Dist LAD lesion, 20% stenosed.  The left ventricular systolic function is normal.  1st Mrg lesion, 60% stenosed. The vessel is small to moderate in size. The stenosis is eccentric and does not appear to be flow limiting.   Recommendations: He has a moderate eccentric stenosis in the first OM branch. This is a small to medium sized vessel. The lesion does not appear to be flow limiting. With upcoming surgery, we have discussed medical management of his CAD for now. He would prefer to attempt medical management for now. Will proceed with surgery next week. If he has chest pain c/w angina going forward, could bring him back for re-look cath and PCI of the OM which would be technically easy to do.      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 Echo 2017  with normal RV/LV function   Orhto:  Post right TKR Alvan Dame 12/19/20     F/U with me in a year   Jenkins Rouge

## 2021-02-19 ENCOUNTER — Encounter: Payer: Self-pay | Admitting: Cardiovascular Disease

## 2021-02-19 ENCOUNTER — Ambulatory Visit: Payer: Medicare HMO | Admitting: Cardiovascular Disease

## 2021-02-19 ENCOUNTER — Other Ambulatory Visit: Payer: Self-pay

## 2021-02-19 VITALS — BP 124/56 | HR 64 | Ht 72.0 in | Wt 195.0 lb

## 2021-02-19 DIAGNOSIS — I2510 Atherosclerotic heart disease of native coronary artery without angina pectoris: Secondary | ICD-10-CM

## 2021-02-19 DIAGNOSIS — E782 Mixed hyperlipidemia: Secondary | ICD-10-CM

## 2021-02-19 NOTE — Patient Instructions (Signed)
Medication Instructions:  *If you need a refill on your cardiac medications before your next appointment, please call your pharmacy*  Lab Work: If you have labs (blood work) drawn today and your tests are completely normal, you will receive your results only by: Marland Kitchen MyChart Message (if you have MyChart) OR . A paper copy in the mail If you have any lab test that is abnormal or we need to change your treatment, we will call you to review the results.  Follow-Up: At [facility], you and your health needs are our priority.  As part of our continuing mission to provide you with exceptional heart care, we have created designated Provider Care Teams.  These Care Teams include your primary Cardiologist (physician) and Advanced Practice Providers (APPs -  Physician Assistants and Nurse Practitioners) who all work together to provide you with the care you need, when you need it.  We recommend signing up for the patient portal called "MyChart".  Sign up information is provided on this After Visit Summary.  MyChart is used to connect with patients for Virtual Visits (Telemedicine).  Patients are able to view lab/test results, encounter notes, upcoming appointments, etc.  Non-urgent messages can be sent to your provider as well.   To learn more about what you can do with MyChart, go to [email or web address].    Your next appointment:   1 year(s)  The format for your next appointment:   In Person  Provider:   You may see Dr. Johnsie Cancel or one of the following Advanced Practice Providers on your designated Care Team:    Kathyrn Drown, NP

## 2022-04-20 NOTE — Progress Notes (Signed)
Cardiology Office Note    Date:  04/28/2022   ID:  Jay Bradshaw, DOB 21-Nov-1935, MRN [id number]  PCP:  Velna Hatchet, MD  Cardiologist:  Dr. Johnsie Cancel  CC: follow up   History of Present Illness:  Jay Bradshaw is a 87 y.o. male with a history of non obstructive CAD, emphysema, essential tremor, hypothyroidism who presents to clinic for cardiology follow up.  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 He moved to [place] from [place]. He established care with me  in 08/2015. He had reported having a cath in 2008 with no interventions.  LHC on 09/25/15 showed 20% prox RCA, 20% mRCA, 20% dLAD, 60% 1st Marg with moderate eccentric stenosis that did not appear to be flow limiting. Per cath report, PCI would be straight forward to do if he had chest pain c/w angina moving forward. Subsequently had uncomplicated left shoulder surgery Dr Mardelle Matte 10/02/2015   Seen by PA 08/2016 with chest pain Cath set up with Dr Angelena Form  Reviewed films: moderate disease in small OM with negative  FFR medical Rx   Some stress from wifes failing health No chest pain.   Daughter lives in [place] And has 74 yo twins  Echo: 09/08/16 EF 77-82% grade 2 diastolic dysfunction mild RAE Reviewed  Had right TKR Dr Alvan Dame 12/19/20 with no cardiac complications Had appendicitis with surgery 12/2020  Feels his tremor may be worse in UE;s and would like to see Dr Tat again   Past Medical History:  Diagnosis Date   Anxiety    Arthritis    Balance problem    more noticed in the dark   Complication of anesthesia    opioids causes urine retention   Constipation    takes Colace daily as needed   COPD (chronic obstructive pulmonary disease) ([place])    Coronary artery disease    takes Metoprolol daily, Prox RCA lesion, 20 %stenosed, Mid RCA lesion 20% stenosed, Dist LAD lesion 20% stenosed EF greater than 65%,, 1st mrg lesion 60% stenosed, mild non-obstructive disease in LAD and RCA, Stenosis of OM Branch appears moderate    Emphysema lung (HCC)    Enlarged prostate    takes Tamsulosin daily   History of echocardiogram 09/08/2016   EF 42-35% grade 2 diastolic dysfunction mild RAE Reviewed   Hyperlipidemia    takes Lovastatin daily   Hypertension    elevated BP but not on meds   Hypothyroidism    takes Synthroid daily   Joint pain    Osteoarthritis of left shoulder 09/29/2015   Osteoarthritis of right shoulder, primary 12/16/2014   PONV (postoperative nausea and vomiting)    Productive cough    Seasonal allergies    Shortness of breath dyspnea    Tremors of nervous system    essential tremors   Urinary frequency    takes Ditropan daily    Past Surgical History:  Procedure Laterality Date   CARDIAC CATHETERIZATION N/A 09/25/2015   Procedure: Left Heart Cath and Coronary Angiography;  Surgeon: Burnell Blanks, MD;  Location: [facility];  Service: Cardiovascular;  Laterality: N/A;   CARDIAC CATHETERIZATION N/A 08/29/2016   Procedure: Left Heart Cath and Coronary Angiography;  Surgeon: Burnell Blanks, MD;  Location: [facility];  Service: Cardiovascular;  Laterality: N/A;   CATARACT EXTRACTION  Aug and Sep 2011   first left eye, then right eye   COLONOSCOPY  June 2008   Dr. Lurena Joiner ref by [facility]  CYSTOSCOPY WITH INSERTION OF UROLIFT N/A 11/09/2017   Procedure: CYSTOSCOPY WITH INSERTION OF UROLIFT;  Surgeon: Franchot Gallo, MD;  Location: [facility];  Service: Urology;  Laterality: N/A;   excision of fibroma  October 1994   in skin on back of neck right of midline, Dede Query ref by [place] CYST  2010   from the back, [place] N/A 12/19/2020   Procedure: APPENDECTOMY LAPAROSCOPIC;  Surgeon: Clovis Riley, MD;  Location: [facility];  Service: General;  Laterality: N/A;   LIPOMA EXCISION  Nov 2004   from neck, right side (near location of mandible cyst), Dr  Lorrine Kin ref by [place] Right 09/22/2020   Procedure: UNICOMPARTMENTAL KNEE;  Surgeon: Marchia Bond, MD;  Location: [facility];  Service: Orthopedics;  Laterality: Right;   PROSTATE BIOPSY  2006   Dr. Nadara Mustard [place]   SHOULDER HEMI-ARTHROPLASTY Right 12/16/2014   Procedure: SHOULDER HEMI-ARTHROPLASTY;  Surgeon: Johnny Bridge, MD;  Location: [facility];  Service: Orthopedics;  Laterality: Right;   SHOULDER SURGERY Right 12/16/2014   hemi-arthroplasty    dr Mardelle Matte   thyroid adenoma removal  August 1970   right side, [facility]   TOTAL SHOULDER ARTHROPLASTY Right 12/16/2014   Procedure: RIGHT TOTAL SHOULDER ARTHROPLASTY;  Surgeon: Johnny Bridge, MD;  Location: [facility];  Service: Orthopedics;  Laterality: Right;   TOTAL SHOULDER ARTHROPLASTY Left 09/29/2015   TOTAL SHOULDER ARTHROPLASTY Left 09/29/2015   Procedure: TOTAL LEFT SHOULDER ARTHROPLASTY;  Surgeon: Marchia Bond, MD;  Location: [facility];  Service: Orthopedics;  Laterality: Left;   vacuolar cyst removal     in right mandible fixed/filled, Denyce Robert ref by Synetta Shadow, DDS   wisdom teeth extracted      Current Medications: Outpatient Medications Prior to Visit  Medication Sig Dispense Refill   aspirin EC 81 MG tablet Take 81 mg by mouth at bedtime. Swallow whole.     beta carotene w/minerals (OCUVITE) tablet Take 1 tablet by mouth every other day. Alternate with multivitamin     bisacodyl (DULCOLAX) 5 MG EC tablet Take 5 mg by mouth daily as needed for moderate constipation.     CALCIUM-MAGNESIUM PO Take 500-800 mg by mouth 2 (two) times daily.      cholecalciferol (VITAMIN D) 400 UNITS TABS tablet Take 400 Units by mouth 2 (two) times daily.     Coenzyme Q10 (CO Q10) 100 MG CAPS Take 200 mg by mouth daily.     hydrocortisone cream 1 % Apply 1 application topically daily as needed for itching.     levothyroxine (SYNTHROID, LEVOTHROID) 88 MCG tablet Take 88 mcg by mouth daily before breakfast.      lovastatin (MEVACOR) 20 MG tablet Take 20 mg by mouth daily.      Melatonin 3 MG TABS Take 3 mg by mouth at bedtime as needed (sleep).      metoprolol succinate (TOPROL-XL) 50 MG 24 hr tablet Take 50 mg by mouth at bedtime.      Multiple Vitamin (MULTIVITAMIN) capsule Take 1 capsule by mouth every other day. Alternate with Ocuvite     nitroGLYCERIN (NITROSTAT) 0.4 MG SL tablet Place 1 tablet (0.4 mg total) under the tongue every 5 (five) minutes as needed for chest pain (up to 3 doses MAX). 25 tablet 3   Potassium 99 MG TABS Take 99 mg by mouth at bedtime.  PSYLLIUM HUSK PO Take 1 capsule by mouth 3 (three) times daily as needed (depending on meals he ate that day). Gel caps     saw palmetto 160 MG capsule Take 160 mg by mouth 3 (three) times daily.     tamsulosin (FLOMAX) 0.4 MG CAPS capsule Take 0.4 mg by mouth See admin instructions. Monday midnight, Thursday morning at 8am and Saturday at 1600     vitamin C (ASCORBIC ACID) 500 MG tablet Take 500 mg by mouth every other day. Alternate with vitamin E     VITAMIN E BLEND PO Take 300 Units by mouth every other day. Alternate with vitamin C     baclofen (LIORESAL) 10 MG tablet Take 1 tablet (10 mg total) by mouth 3 (three) times daily. As needed for muscle spasm (Patient not taking: Reported on 04/28/2022) 50 tablet 0   HYDROcodone-acetaminophen (NORCO/VICODIN) 5-325 MG tablet Take 1 tablet by mouth every 6 (six) hours as needed for moderate pain. (Patient not taking: Reported on 04/28/2022) 15 tablet 0   ondansetron (ZOFRAN) 4 MG tablet Take 1 tablet (4 mg total) by mouth every 8 (eight) hours as needed for nausea or vomiting. (Patient not taking: Reported on 04/28/2022) 10 tablet 0   ondansetron (ZOFRAN-ODT) 4 MG disintegrating tablet Take 1 tablet (4 mg total) by mouth every 6 (six) hours as needed for nausea. (Patient not taking: Reported on 04/28/2022) 20 tablet 0   No facility-administered medications prior to visit.     Allergies:    Oxycodone   Social History   Socioeconomic History   Marital status: Married    Spouse name: Not on file   Number of children: Not on file   Years of education: Not on file   Highest education level: Not on file  Occupational History   Occupation: retired    Comment: biology faculty [employer]  Tobacco Use   Smoking status: Former    Types: Cigarettes    Quit date: 11/02/1971    Years since quitting: 50.5   Smokeless tobacco: Never  Vaping Use   Vaping Use: Never used  Substance and Sexual Activity   Alcohol use: Yes    Comment: 3 drinks weekly   Drug use: No   Sexual activity: Never  Other Topics Concern   Not on file  Social History Narrative   Not on file   Social Determinants of Health   Financial Resource Strain: Not on file  Food Insecurity: Not on file  Transportation Needs: Not on file  Physical Activity: Not on file  Stress: Not on file  Social Connections: Not on file     Family History:  The patient's family history includes Breast cancer in his mother; Hypertension in his mother; Prostate cancer in his brother; Tremor in his brother, daughter, and father.     ROS:   Please see the history of present illness.    ROS All other systems reviewed and are negative.   PHYSICAL EXAM:   VS:  BP 110/72   Pulse 65   Ht 6' (1.829 m)   Wt 193 lb (87.5 kg)   SpO2 95%   BMI 26.18 kg/m    Affect appropriate Healthy:  appears stated age 39: normal Neck supple with no adenopathy JVP normal no bruits no thyromegaly Lungs clear with no wheezing and good diaphragmatic motion Heart:  S1/S2 no murmur, no rub, gallop or click PMI normal Abdomen: benighn, BS positve, post RLQ appendectomy  Distal pulses intact with  no bruits Trace LE  edema Neuro non-focal essential UE tremors  Post right TKR   Wt Readings from Last 3 Encounters:  04/28/22 193 lb (87.5 kg)  02/19/21 195 lb (88.5 kg)  12/19/20 180 lb (81.6 kg)      Studies/Labs Reviewed:    EKG:  SR rate 61 normal 08/05/20  04/28/2022 SR Rate 65 normal   Recent Labs: No results found for requested labs within last 8760 hours.   Lipid Panel No results found for: CHOL, TRIG, HDL, CHOLHDL, VLDL, LDLCALC, LDLDIRECT  Additional studies/ records that were reviewed today include:  Myoview 09/21/16 Study Highlights  The left ventricular ejection fraction is normal (55-65%). Nuclear stress EF: 65%. ST segment depression was noted during stress in the II, III, aVF, V5 and V6 leads. This is a low risk study.   Low risk stress nuclear study with ECG changes; small, moderate intensity, reversible inferior basal defect consistent with mild inferior ischemia; EF 65 with normal wall motion.       Cardiac catheterization 08/29/16  Conclusion   Prox RCA lesion, 20% stenosed. Mid RCA lesion, 20% stenosed. Dist LAD lesion, 20% stenosed. The left ventricular systolic function is normal. 1st Mrg lesion, 60% stenosed. The vessel is small to moderate in size. The stenosis is eccentric and does not appear to be flow limiting.   Recommendations: He has a moderate eccentric stenosis in the first OM branch. This is a small to medium sized vessel. The lesion does not appear to be flow limiting. With upcoming surgery, we have discussed medical management of his CAD for now. He would prefer to attempt medical management for now. Will proceed with surgery next week. If he has chest pain c/w angina going forward, could bring him back for re-look cath and PCI of the OM which would be technically easy to do.       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 Echo 2017  with normal RV/LV function   Orhto:  Post right TKR Alvan Dame 12/19/20     F/U with me in a year   Jenkins Rouge

## 2022-04-28 ENCOUNTER — Encounter: Payer: Self-pay | Admitting: Cardiovascular Disease

## 2022-04-28 ENCOUNTER — Ambulatory Visit (INDEPENDENT_AMBULATORY_CARE_PROVIDER_SITE_OTHER): Payer: Medicare HMO | Admitting: Cardiovascular Disease

## 2022-04-28 VITALS — BP 110/72 | HR 65 | Ht 72.0 in | Wt 193.0 lb

## 2022-04-28 DIAGNOSIS — I2510 Atherosclerotic heart disease of native coronary artery without angina pectoris: Secondary | ICD-10-CM

## 2022-04-28 DIAGNOSIS — G250 Essential tremor: Secondary | ICD-10-CM

## 2022-04-28 DIAGNOSIS — E782 Mixed hyperlipidemia: Secondary | ICD-10-CM | POA: Diagnosis not present

## 2022-04-28 NOTE — Patient Instructions (Addendum)
Medication Instructions:  Your physician recommends that you continue on your current medications as directed. Please refer to the Current Medication list given to you today.  *If you need a refill on your cardiac medications before your next appointment, please call your pharmacy*  Lab Work: If you have labs (blood work) drawn today and your tests are completely normal, you will receive your results only by: [place] (if you have MyChart) OR A paper copy in the mail If you have any lab test that is abnormal or we need to change your treatment, we will call you to review the results.  Testing/Procedures: None ordered today.  Follow-Up: At [facility], you and your health needs are our priority.  As part of our continuing mission to provide you with exceptional heart care, we have created designated Provider Care Teams.  These Care Teams include your primary Cardiologist (physician) and Advanced Practice Providers (APPs -  Physician Assistants and Nurse Practitioners) who all work together to provide you with the care you need, when you need it.  We recommend signing up for the patient portal called "MyChart".  Sign up information is provided on this After Visit Summary.  MyChart is used to connect with patients for Virtual Visits (Telemedicine).  Patients are able to view lab/test results, encounter notes, upcoming appointments, etc.  Non-urgent messages can be sent to your provider as well.   To learn more about what you can do with MyChart, go to [email or web address].    Your next appointment:   12 month(s)  The format for your next appointment:   In Person  Provider:   Jenkins Rouge, MD {  You have been referred to Neurology, Dr. Carles Collet.   Important Information About Sugar

## 2022-05-02 ENCOUNTER — Encounter: Payer: Self-pay | Admitting: Neurology

## 2022-05-10 ENCOUNTER — Ambulatory Visit: Payer: Medicare HMO | Admitting: Neurology

## 2022-05-24 ENCOUNTER — Ambulatory Visit: Payer: Medicare HMO | Admitting: Neurology

## 2022-05-24 ENCOUNTER — Encounter: Payer: Self-pay | Admitting: Neurology

## 2022-05-24 VITALS — BP 140/60 | HR 63 | Ht 72.0 in | Wt 193.0 lb

## 2022-05-24 DIAGNOSIS — G250 Essential tremor: Secondary | ICD-10-CM | POA: Diagnosis not present

## 2022-05-24 DIAGNOSIS — R258 Other abnormal involuntary movements: Secondary | ICD-10-CM

## 2022-05-25 ENCOUNTER — Other Ambulatory Visit: Payer: Self-pay

## 2022-06-14 ENCOUNTER — Other Ambulatory Visit: Payer: Self-pay

## 2022-06-14 DIAGNOSIS — G250 Essential tremor: Secondary | ICD-10-CM

## 2022-06-14 NOTE — Progress Notes (Signed)
dat

## 2022-07-11 ENCOUNTER — Telehealth: Payer: Self-pay | Admitting: Neurology

## 2022-07-11 NOTE — Telephone Encounter (Signed)
Called Kylie back and left message nop Pre Cert needed and the Reference number is [id number]

## 2022-07-11 NOTE — Telephone Encounter (Signed)
Jay Bradshaw from [facility] called and needs prior authorization.  Her callback is [phone] ext [phone].

## 2022-07-13 ENCOUNTER — Encounter (HOSPITAL_COMMUNITY)
Admission: RE | Admit: 2022-07-13 | Discharge: 2022-07-13 | Disposition: A | Discharge status: Home / Self Care | Payer: Medicare HMO | Source: Ambulatory Visit | Attending: Neurology | Admitting: Neurology

## 2022-07-13 DIAGNOSIS — G250 Essential tremor: Secondary | ICD-10-CM | POA: Insufficient documentation

## 2022-07-13 MED ORDER — POTASSIUM IODIDE (ANTIDOTE) 130 MG PO TABS: 130.0000 mg | ORAL_TABLET | Freq: Once | ORAL | Status: DC

## 2022-07-13 MED ORDER — POTASSIUM IODIDE (ANTIDOTE) 130 MG PO TABS
ORAL_TABLET | ORAL | Status: AC
  Administered 2022-07-13: 130 mg via ORAL
  Filled 2022-07-13: qty 1

## 2022-07-13 MED ORDER — IOFLUPANE I 123 185 MBQ/2.5ML IV SOLN
4.6000 | Freq: Once | INTRAVENOUS | Status: AC | PRN
  Administered 2022-07-13: 4.6 via INTRAVENOUS
  Filled 2022-07-13: qty 5

## 2022-08-09 NOTE — Progress Notes (Signed)
Assessment/Plan:    1.  Essential Tremor  -DaTscan completed August, 2023 was normal.  -Tremor significantly progressed from 2019-2023, and patient has developed a rest component.  -Patient and I discussed various treatments for tremor.  He is already on a very low-dose beta-blocker (unlikely to help tremor at this dose -Toprol-XL, 50 mg).  We discussed primidone in detail.  We will start primidone 50 mg and work to 50 mg bid.  R/b/se discussed  -asked about prevagen.  Dont recommend.  Subjective:   Jay Bradshaw was seen today in follow up for essential tremor.  My previous records were reviewed prior to todays visit. Pt had a DaTscan done since our last visit which was negative.  Pt states that "I don't think tremor is a big problem and I am able to do most elemental things like feeding myself."   Father with h/o tremor.    Current prescribed movement disorder medications: Toprol XL, 50 mg (prescribed by primary care, not for tremor)   PREVIOUS MEDICATIONS: none to date  ALLERGIES:   Allergies  Allergen Reactions   Oxycodone Other (See Comments)    Difficulty urinating for 24 plus hours    CURRENT MEDICATIONS:  Current Meds  Medication Sig   aspirin EC 81 MG tablet Take 81 mg by mouth at bedtime. Swallow whole.   beta carotene w/minerals (OCUVITE) tablet Take 1 tablet by mouth every other day. Alternate with multivitamin   bisacodyl (DULCOLAX) 5 MG EC tablet Take 5 mg by mouth daily as needed for moderate constipation.   CALCIUM-MAGNESIUM PO Take 500-800 mg by mouth 2 (two) times daily.    cholecalciferol (VITAMIN D) 400 UNITS TABS tablet Take 400 Units by mouth 2 (two) times daily.   Coenzyme Q10 (CO Q10) 100 MG CAPS Take 200 mg by mouth daily.   levothyroxine (SYNTHROID, LEVOTHROID) 88 MCG tablet Take 88 mcg by mouth daily before breakfast.   lovastatin (MEVACOR) 20 MG tablet Take 20 mg by mouth daily.    Melatonin 3 MG TABS Take 3 mg by mouth at bedtime as needed  (sleep).    metoprolol succinate (TOPROL-XL) 50 MG 24 hr tablet Take 50 mg by mouth at bedtime.    Multiple Vitamin (MULTIVITAMIN) capsule Take 1 capsule by mouth every other day. Alternate with Ocuvite   nitroGLYCERIN (NITROSTAT) 0.4 MG SL tablet Place 1 tablet (0.4 mg total) under the tongue every 5 (five) minutes as needed for chest pain (up to 3 doses MAX).   Potassium 99 MG TABS Take 99 mg by mouth at bedtime.    PSYLLIUM HUSK PO Take 1 capsule by mouth 3 (three) times daily as needed (depending on meals he ate that day). Gel caps   saw palmetto 160 MG capsule Take 160 mg by mouth 3 (three) times daily.   tamsulosin (FLOMAX) 0.4 MG CAPS capsule Take 0.4 mg by mouth See admin instructions. Monday midnight, Thursday morning at 8am and Saturday at 1600   vitamin C (ASCORBIC ACID) 500 MG tablet Take 500 mg by mouth every other day. Alternate with vitamin E   VITAMIN E BLEND PO Take 300 Units by mouth every other day. Alternate with vitamin C      Objective:    PHYSICAL EXAMINATION:    VITALS:   Vitals:   08/11/22 0826  BP: 118/76  Pulse: 62  SpO2: 96%  Weight: 188 lb 12.8 oz (85.6 kg)  Height: 6' (1.829 m)    GEN:  The patient appears  stated age and is in NAD. HEENT:  Normocephalic, atraumatic.  The mucous membranes are moist. The superficial temporal arteries are without ropiness or tenderness. CV:  RRR Lungs:  CTAB Neck/HEME:  There are no carotid bruits bilaterally.  Neurological examination:  Orientation: The patient is alert and oriented x3. Cranial nerves: There is good facial symmetry. The speech is fluent and clear. Soft palate rises symmetrically and there is no tongue deviation. Hearing is intact to conversational tone. Sensation: Sensation is intact to light touch throughout Motor: Strength is at least antigravity x4.  Movement examination: Tone: There is nl tone in the bilateral upper extremities.  The tone in the lower extremities is nl.  Abnormal movements:  there is bilateral UE rest tremor, left greater than right.  This does not increase with distraction procedures.  There is postural tremor.  There is intention tremor.  He has trouble with Archimedes spirals, but less so than 1 may think.  He does not spill a lot of water when pouring water from 1 glass to another. Coordination:  There is no significant decremation with RAM's, with any form of RAMS, including alternating supination and pronation of the forearm, hand opening and closing, finger taps, heel taps and toe taps. Gait and Station: The patient pushes off to arise.  He ambulates fairly well, but there is left upper extremity rest tremor with ambulation. I have reviewed and interpreted the following labs independently   Chemistry      Component Value Date/Time   NA 137 12/20/2020 0136   K 3.9 12/20/2020 0136   CL 104 12/20/2020 0136   CO2 24 12/20/2020 0136   BUN 18 12/20/2020 0136   CREATININE 1.04 12/20/2020 0136   CREATININE 0.93 08/26/2016 1541      Component Value Date/Time   CALCIUM 8.4 (L) 12/20/2020 0136   ALKPHOS 62 12/19/2020 1048   AST 21 12/19/2020 1048   ALT 16 12/19/2020 1048   BILITOT 1.0 12/19/2020 1048      Lab Results  Component Value Date   WBC 13.0 (H) 12/20/2020   HGB 11.3 (L) 12/20/2020   HCT 35.8 (L) 12/20/2020   MCV 93.7 12/20/2020   PLT 232 12/20/2020   No results found for: "TSH"   Chemistry      Component Value Date/Time   NA 137 12/20/2020 0136   K 3.9 12/20/2020 0136   CL 104 12/20/2020 0136   CO2 24 12/20/2020 0136   BUN 18 12/20/2020 0136   CREATININE 1.04 12/20/2020 0136   CREATININE 0.93 08/26/2016 1541      Component Value Date/Time   CALCIUM 8.4 (L) 12/20/2020 0136   ALKPHOS 62 12/19/2020 1048   AST 21 12/19/2020 1048   ALT 16 12/19/2020 1048   BILITOT 1.0 12/19/2020 1048         Total time spent on today's visit was 20 minutes, including both face-to-face time and nonface-to-face time.  Time included that spent on  review of records (prior notes available to me/labs/imaging if pertinent), discussing treatment and goals, answering patient's questions and coordinating care.  Cc:  Velna Hatchet, MD

## 2022-08-11 ENCOUNTER — Ambulatory Visit: Payer: Medicare HMO | Admitting: Neurology

## 2022-08-11 ENCOUNTER — Encounter: Payer: Self-pay | Admitting: Neurology

## 2022-08-11 VITALS — BP 118/76 | HR 62 | Ht 72.0 in | Wt 188.8 lb

## 2022-08-11 DIAGNOSIS — G250 Essential tremor: Secondary | ICD-10-CM

## 2022-08-11 MED ORDER — PRIMIDONE 50 MG PO TABS: 50.0000 mg | ORAL_TABLET | Freq: Two times a day (BID) | ORAL | 1 refills | Status: DC

## 2022-08-11 NOTE — Patient Instructions (Addendum)
Start primidone 50 mg - 1/2 tablet at bedtime for 1 week and then increase to 1 tablet at bedtime x 2 weeks, and then take 1 tablet twice per day thereafter  The physicians and staff at [facility] are committed to providing excellent care. You may receive a survey requesting feedback about your experience at our office. We strive to receive "very good" responses to the survey questions. If you feel that your experience would prevent you from giving the office a "very good " response, please contact our office to try to remedy the situation. We may be reached at [phone]. Thank you for taking the time out of your busy day to complete the survey.

## 2022-09-09 ENCOUNTER — Telehealth: Payer: Self-pay | Admitting: Anesthesiology

## 2022-09-09 NOTE — Telephone Encounter (Signed)
Patient called regarding medication. Having side effects with primidone when taking 2 tablets a day did not specify what side effects. He is back on taking 1 pill a day instead of the 2. Tremors seem to have reduced even with taking one tablet a day.

## 2022-09-09 NOTE — Telephone Encounter (Signed)
Pt stated that with one pill he is doing well no side effects at all and Tremors are good, but with 2 pills his has swelling in his throat and ringing in his ears and more vivid dreams

## 2022-09-12 ENCOUNTER — Other Ambulatory Visit: Payer: Self-pay

## 2022-09-12 DIAGNOSIS — G250 Essential tremor: Secondary | ICD-10-CM

## 2022-09-12 DIAGNOSIS — R258 Other abnormal involuntary movements: Secondary | ICD-10-CM

## 2022-09-12 MED ORDER — PRIMIDONE 50 MG PO TABS: 50.0000 mg | ORAL_TABLET | Freq: Every day | ORAL | 1 refills | Status: DC

## 2022-09-12 NOTE — Telephone Encounter (Signed)
Changed in chart

## 2022-09-19 ENCOUNTER — Telehealth: Payer: Self-pay | Admitting: Neurology

## 2022-09-19 NOTE — Telephone Encounter (Signed)
Patient called to report he has started taking another half of the primidone with the goal of getting to 2 tablets and he is doing fine with it so far.  The tinnitus is still a problem but it is not terrible.  He will call back with any concerns, fyi only.

## 2023-02-20 NOTE — Progress Notes (Unsigned)
Assessment/Plan:    1.  Essential Tremor  -DaTscan completed August, 2023 was normal.  -pt not able to tolerate primidone.  He has been off of it for nearly 4 months and thinks that the tinnitus that remains is still a SE of the medication.  I told him I was not convinced that was from the primidone but we wouldn't use that  -on beta blocker already from primary care so cannot give propranolol for tremor  -discussed that the above 2 meds are the only first line meds for tremor  -discussed 2nd line meds but he isn't interested right now.  If we restart, likely would trial gabapentin  -discussed cala trio and readi steadi and info given on both  -f/u prn  Subjective:   Jay Bradshaw was seen today in follow up for essential tremor.  My previous records were reviewed prior to todays visit.  We started primidone last visit an initially we are going to work him up to 1 pill twice per day.  He called back and stated that he was doing fine on 1 pill once per day.  A few weeks later, he stated that he was starting to titrate the pill upward.  He reports it caused tinnitus, UTI, rash, achy joints and he d/c it.  He's been off of it since beginning of the year.  He still has the tinnitus but that is better.  The last 2 weeks tremor has been "less intense" but prior to that tremor was not bothersome in daily life unless something initiated it  Current prescribed movement disorder medications: Toprol XL, 50 mg (prescribed by primary care, not for tremor)   PREVIOUS MEDICATIONS: primidone (stated caused tinnitus, UTI, rash, achy joints and he d/c it.  Tinnitus lasted for months after d/c)  ALLERGIES:   Allergies  Allergen Reactions   Oxycodone Other (See Comments)    Difficulty urinating for 24 plus hours   Primidone Rash, Swelling and Tinitus    CURRENT MEDICATIONS:  Current Meds  Medication Sig   aspirin EC 81 MG tablet Take 81 mg by mouth at bedtime. Swallow whole.   beta carotene  w/minerals (OCUVITE) tablet Take 1 tablet by mouth every other day. Alternate with multivitamin   bisacodyl (DULCOLAX) 5 MG EC tablet Take 5 mg by mouth daily as needed for moderate constipation.   CALCIUM-MAGNESIUM PO Take 500-800 mg by mouth 2 (two) times daily.    cholecalciferol (VITAMIN D) 400 UNITS TABS tablet Take 400 Units by mouth 2 (two) times daily.   Coenzyme Q10 (CO Q10) 100 MG CAPS Take 200 mg by mouth daily.   levothyroxine (SYNTHROID, LEVOTHROID) 88 MCG tablet Take 88 mcg by mouth daily before breakfast.   lovastatin (MEVACOR) 20 MG tablet Take 20 mg by mouth daily.    Melatonin 3 MG TABS Take 3 mg by mouth at bedtime as needed (sleep).    metoprolol succinate (TOPROL-XL) 50 MG 24 hr tablet Take 50 mg by mouth at bedtime.    Multiple Vitamin (MULTIVITAMIN) capsule Take 1 capsule by mouth every other day. Alternate with Ocuvite   nitroGLYCERIN (NITROSTAT) 0.4 MG SL tablet Place 1 tablet (0.4 mg total) under the tongue every 5 (five) minutes as needed for chest pain (up to 3 doses MAX).   Potassium 99 MG TABS Take 99 mg by mouth at bedtime.    PSYLLIUM HUSK PO Take 1 capsule by mouth 3 (three) times daily as needed (depending on meals he ate that  day). Gel caps   saw palmetto 160 MG capsule Take 160 mg by mouth 3 (three) times daily.   tamsulosin (FLOMAX) 0.4 MG CAPS capsule Take 0.4 mg by mouth See admin instructions. Monday midnight, Thursday morning at 8am and Saturday at 1600   vitamin C (ASCORBIC ACID) 500 MG tablet Take 500 mg by mouth every other day. Alternate with vitamin E   VITAMIN E BLEND PO Take 300 Units by mouth every other day. Alternate with vitamin C    Objective:    PHYSICAL EXAMINATION:    VITALS:   Vitals:   02/21/23 1046  BP: (!) 146/78  Pulse: 63  SpO2: 95%  Weight: 187 lb (84.8 kg)  Height: 6' (1.829 m)     GEN:  The patient appears stated age and is in NAD. HEENT:  Normocephalic, atraumatic.  The mucous membranes are moist. The superficial  temporal arteries are without ropiness or tenderness. CV:  RRR Lungs:  CTAB Neck/HEME:  There are no carotid bruits bilaterally.  Neurological examination:  Orientation: The patient is alert and oriented x3. Cranial nerves: There is good facial symmetry. The speech is fluent and clear. Soft palate rises symmetrically and there is no tongue deviation. Hearing is intact to conversational tone. Sensation: Sensation is intact to light touch throughout Motor: Strength is at least antigravity x4.  Movement examination: Tone: There is nl tone in the bilateral upper extremities.  The tone in the lower extremities is nl.  Abnormal movements: there is bilateral UE rest tremor, left greater than right.  This does not increase with distraction procedures.  There is postural tremor.  There is intention tremor.  He draws archimedes spirals fairly well Coordination:  There is no significant decremation with RAM's, with any form of RAMS, including alternating supination and pronation of the forearm, hand opening and closing, finger taps, heel taps and toe taps. I have reviewed and interpreted the following labs independently   Chemistry      Component Value Date/Time   NA 137 12/20/2020 0136   K 3.9 12/20/2020 0136   CL 104 12/20/2020 0136   CO2 24 12/20/2020 0136   BUN 18 12/20/2020 0136   CREATININE 1.04 12/20/2020 0136   CREATININE 0.93 08/26/2016 1541      Component Value Date/Time   CALCIUM 8.4 (L) 12/20/2020 0136   ALKPHOS 62 12/19/2020 1048   AST 21 12/19/2020 1048   ALT 16 12/19/2020 1048   BILITOT 1.0 12/19/2020 1048      Lab Results  Component Value Date   WBC 13.0 (H) 12/20/2020   HGB 11.3 (L) 12/20/2020   HCT 35.8 (L) 12/20/2020   MCV 93.7 12/20/2020   PLT 232 12/20/2020   No results found for: "TSH"   Chemistry      Component Value Date/Time   NA 137 12/20/2020 0136   K 3.9 12/20/2020 0136   CL 104 12/20/2020 0136   CO2 24 12/20/2020 0136   BUN 18 12/20/2020 0136    CREATININE 1.04 12/20/2020 0136   CREATININE 0.93 08/26/2016 1541      Component Value Date/Time   CALCIUM 8.4 (L) 12/20/2020 0136   ALKPHOS 62 12/19/2020 1048   AST 21 12/19/2020 1048   ALT 16 12/19/2020 1048   BILITOT 1.0 12/19/2020 1048         Total time spent on today's visit was 30 minutes, including both face-to-face time and nonface-to-face time.  Time included that spent on review of records (prior notes available to  me/labs/imaging if pertinent), discussing treatment and goals, answering patient's questions and coordinating care.  Cc:  Velna Hatchet, MD

## 2023-02-21 ENCOUNTER — Ambulatory Visit: Payer: Medicare HMO | Admitting: Neurology

## 2023-02-21 VITALS — BP 146/78 | HR 63 | Ht 72.0 in | Wt 187.0 lb

## 2023-02-21 DIAGNOSIS — G250 Essential tremor: Secondary | ICD-10-CM

## 2023-02-21 NOTE — Patient Instructions (Addendum)
You can try weighted spoons and forks.  You can try weighted gloves (look up [place] on [address]).  You can also look up [employer] and [employer].  We discussed gabapentin and topamax but decided to hold on those for now.    It was good to see you today!  The physicians and staff at [facility] are committed to providing excellent care. You may receive a survey requesting feedback about your experience at our office. We strive to receive "very good" responses to the survey questions. If you feel that your experience would prevent you from giving the office a "very good " response, please contact our office to try to remedy the situation. We may be reached at [phone]. Thank you for taking the time out of your busy day to complete the survey.

## 2023-09-05 NOTE — Progress Notes (Signed)
Cardiology Office Note    Date:  09/08/2023   ID:  Jay Bradshaw, DOB 06/29/1935, MRN [id number]  PCP:  Alysia Penna, MD  Cardiologist:  Dr. Eden Emms  CC: follow up   History of Present Illness:  Jay Bradshaw is a 88 y.o. male with a history of non obstructive CAD, emphysema, essential tremor, hypothyroidism who presents to clinic for cardiology follow up.  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 He moved to [place] from [place]. He established care with me  in 08/2015. He had reported having a cath in 2008 with no interventions.  LHC on 09/25/15 showed 20% prox RCA, 20% mRCA, 20% dLAD, 60% 1st Marg with moderate eccentric stenosis that did not appear to be flow limiting. Per cath report, PCI would be straight forward to do if he had chest pain c/w angina moving forward. Subsequently had uncomplicated left shoulder surgery Dr Dion Saucier 10/02/2015   Seen by PA 08/2016 with chest pain Cath set up with Dr Clifton James  Reviewed films: moderate disease in small OM with negative  FFR medical Rx   Some stress from wifes failing health No chest pain.   Daughter lives in [place] And has 37 yo twins  Echo: 09/08/16 EF 60-65% grade 2 diastolic dysfunction mild RAE Reviewed  Had right TKR Dr Charlann Boxer 12/19/20 with no cardiac complications Had appendicitis with surgery 12/2020  Feels his tremor may be worse in UE;s seen by Dr Tat DAT Scan negative and did not tolerate Primidone   Wife has a lot of dental work coming up and this is stressful for her and by association him  Past Medical History:  Diagnosis Date   Anxiety    Arthritis    Balance problem    more noticed in the dark   Complication of anesthesia    opioids causes urine retention   Constipation    takes Colace daily as needed   COPD (chronic obstructive pulmonary disease) (HCC)    Coronary artery disease    takes Metoprolol daily, Prox RCA lesion, 20 %stenosed, Mid RCA lesion 20% stenosed, Dist LAD lesion 20% stenosed EF  greater than 65%,, 1st mrg lesion 60% stenosed, mild non-obstructive disease in LAD and RCA, Stenosis of OM Branch appears moderate   Emphysema lung (HCC)    Enlarged prostate    takes Tamsulosin daily   History of echocardiogram 09/08/2016   EF 60-65% grade 2 diastolic dysfunction mild RAE Reviewed   Hyperlipidemia    takes Lovastatin daily   Hypertension    elevated BP but not on meds   Hypothyroidism    takes Synthroid daily   Joint pain    Osteoarthritis of left shoulder 09/29/2015   Osteoarthritis of right shoulder, primary 12/16/2014   PONV (postoperative nausea and vomiting)    Productive cough    Seasonal allergies    Shortness of breath dyspnea    Tremors of nervous system    essential tremors   Urinary frequency    takes Ditropan daily    Past Surgical History:  Procedure Laterality Date   CARDIAC CATHETERIZATION N/A 09/25/2015   Procedure: Left Heart Cath and Coronary Angiography;  Surgeon: Kathleene Hazel, MD;  Location: [facility] INVASIVE CV LAB;  Service: Cardiovascular;  Laterality: N/A;   CARDIAC CATHETERIZATION N/A 08/29/2016   Procedure: Left Heart Cath and Coronary Angiography;  Surgeon: Kathleene Hazel, MD;  Location: [facility] INVASIVE CV LAB;  Service: Cardiovascular;  Laterality: N/A;   CATARACT EXTRACTION  Aug and Sep  2011   first left eye, then right eye   COLONOSCOPY  June 2008   Dr. Minerva Ends ref by Lajoyce Corners WITH INSERTION OF UROLIFT N/A 11/09/2017   Procedure: CYSTOSCOPY WITH INSERTION OF UROLIFT;  Surgeon: Marcine Matar, MD;  Location: [facility];  Service: Urology;  Laterality: N/A;   excision of fibroma  October 1994   in skin on back of neck right of midline, Suzzanne Cloud ref by Pryor Montes   HERNIA REPAIR     IRRIGATION AND DEBRIDEMENT SEBACEOUS CYST  2010   from the back, [facility]   LAPAROSCOPIC APPENDECTOMY N/A 12/19/2020   Procedure: APPENDECTOMY LAPAROSCOPIC;  Surgeon: Berna Bue, MD;  Location: [facility]  OR;  Service: General;  Laterality: N/A;   LIPOMA EXCISION  Nov 2004   from neck, right side (near location of mandible cyst), Dr Egbert Garibaldi ref by Rasalen   PARTIAL KNEE ARTHROPLASTY Right 09/22/2020   Procedure: UNICOMPARTMENTAL KNEE;  Surgeon: Teryl Lucy, MD;  Location: [facility] ORS;  Service: Orthopedics;  Laterality: Right;   PROSTATE BIOPSY  2006   Dr. Dimas [place]   SHOULDER HEMI-ARTHROPLASTY Right 12/16/2014   Procedure: SHOULDER HEMI-ARTHROPLASTY;  Surgeon: Eulas Post, MD;  Location: [facility] OR;  Service: Orthopedics;  Laterality: Right;   SHOULDER SURGERY Right 12/16/2014   hemi-arthroplasty    dr Dion Saucier   thyroid adenoma removal  August 1970   right side, [facility]   TOTAL SHOULDER ARTHROPLASTY Right 12/16/2014   Procedure: RIGHT TOTAL SHOULDER ARTHROPLASTY;  Surgeon: Eulas Post, MD;  Location: [facility] OR;  Service: Orthopedics;  Laterality: Right;   TOTAL SHOULDER ARTHROPLASTY Left 09/29/2015   TOTAL SHOULDER ARTHROPLASTY Left 09/29/2015   Procedure: TOTAL LEFT SHOULDER ARTHROPLASTY;  Surgeon: Teryl Lucy, MD;  Location: [facility] OR;  Service: Orthopedics;  Laterality: Left;   vacuolar cyst removal     in right mandible fixed/filled, Ileene Musa ref by Wilfred Lacy, DDS   wisdom teeth extracted      Current Medications: Outpatient Medications Prior to Visit  Medication Sig Dispense Refill   aspirin EC 81 MG tablet Take 81 mg by mouth at bedtime. Swallow whole.     beta carotene w/minerals (OCUVITE) tablet Take 1 tablet by mouth every other day. Alternate with multivitamin     bisacodyl (DULCOLAX) 5 MG EC tablet Take 5 mg by mouth daily as needed for moderate constipation.     CALCIUM-MAGNESIUM PO Take 500-800 mg by mouth 2 (two) times daily.      cholecalciferol (VITAMIN D) 400 UNITS TABS tablet Take 400 Units by mouth 2 (two) times daily.     Coenzyme Q10 (CO Q10) 100 MG CAPS Take 200 mg by mouth daily.     levothyroxine (SYNTHROID, LEVOTHROID) 88 MCG tablet  Take 88 mcg by mouth daily before breakfast.     lovastatin (MEVACOR) 20 MG tablet Take 20 mg by mouth daily.      Melatonin 3 MG TABS Take 3 mg by mouth at bedtime as needed (sleep).      metoprolol succinate (TOPROL-XL) 50 MG 24 hr tablet Take 50 mg by mouth at bedtime.      Multiple Vitamin (MULTIVITAMIN) capsule Take 1 capsule by mouth every other day. Alternate with Ocuvite     nitroGLYCERIN (NITROSTAT) 0.4 MG SL tablet Place 1 tablet (0.4 mg total) under the tongue every 5 (five) minutes as needed for chest pain (up to 3 doses MAX). 25 tablet 3   Potassium 99 MG  TABS Take 99 mg by mouth at bedtime.      PSYLLIUM HUSK PO Take 1 capsule by mouth 3 (three) times daily as needed (depending on meals he ate that day). Gel caps     saw palmetto 160 MG capsule Take 160 mg by mouth 3 (three) times daily.     tamsulosin (FLOMAX) 0.4 MG CAPS capsule Take 0.4 mg by mouth See admin instructions. Monday midnight, Thursday morning at 8am and Saturday at 1600     vitamin C (ASCORBIC ACID) 500 MG tablet Take 500 mg by mouth every other day. Alternate with vitamin E     VITAMIN E BLEND PO Take 300 Units by mouth every other day. Alternate with vitamin C     No facility-administered medications prior to visit.     Allergies:   Oxycodone and Primidone   Social History   Socioeconomic History   Marital status: Married    Spouse name: Not on file   Number of children: Not on file   Years of education: Not on file   Highest education level: Not on file  Occupational History   Occupation: retired    Comment: biology faculty [employer]  Tobacco Use   Smoking status: Former    Current packs/day: 0.00    Types: Cigarettes    Quit date: 11/02/1971    Years since quitting: 51.8   Smokeless tobacco: Never  Vaping Use   Vaping status: Never Used  Substance and Sexual Activity   Alcohol use: Yes    Comment: 1 time per week   Drug use: No   Sexual activity: Never  Other Topics Concern    Not on file  Social History Narrative   Right handed    Lives with wife in one story home   Caffeine 2 cups one in AM one after supper   Social Determinants of Health   Financial Resource Strain: Not on file  Food Insecurity: Not on file  Transportation Needs: Not on file  Physical Activity: Not on file  Stress: Not on file  Social Connections: Not on file     Family History:  The patient's family history includes Breast cancer in his mother; Hypertension in his mother; Prostate cancer in his brother; Tremor in his brother, daughter, and father.     ROS:   Please see the history of present illness.    ROS All other systems reviewed and are negative.   PHYSICAL EXAM:   VS:  There were no vitals taken for this visit.   Affect appropriate Healthy:  appears stated age HEENT: normal Neck supple with no adenopathy JVP normal no bruits no thyromegaly Lungs clear with no wheezing and good diaphragmatic motion Heart:  S1/S2 no murmur, no rub, gallop or click PMI normal Abdomen: benighn, BS positve, post RLQ appendectomy  Distal pulses intact with no bruits Trace LE  edema Neuro non-focal essential UE tremors  Post right TKR   Wt Readings from Last 3 Encounters:  02/21/23 187 lb (84.8 kg)  08/11/22 188 lb 12.8 oz (85.6 kg)  05/24/22 193 lb (87.5 kg)      Studies/Labs Reviewed:   EKG:  SR rate 61 normal 08/05/20  09/08/2023 SR Rate 65 normal   Recent Labs: No results found for requested labs within last 365 days.   Lipid Panel No results found for: "CHOL", "TRIG", "HDL", "CHOLHDL", "VLDL", "LDLCALC", "LDLDIRECT"  Additional studies/ records that were reviewed today include:  Myoview 09/21/16 Study Highlights  The left ventricular ejection fraction is normal (55-65%). Nuclear stress EF: 65%. ST segment depression was noted during stress in the II, III, aVF, V5 and V6 leads. This is a low risk study.   Low risk stress nuclear study with ECG changes; small, moderate  intensity, reversible inferior basal defect consistent with mild inferior ischemia; EF 65 with normal wall motion.       Cardiac catheterization 08/29/16  Conclusion   Prox RCA lesion, 20% stenosed. Mid RCA lesion, 20% stenosed. Dist LAD lesion, 20% stenosed. The left ventricular systolic function is normal. 1st Mrg lesion, 60% stenosed. The vessel is small to moderate in size. The stenosis is eccentric and does not appear to be flow limiting.   Recommendations: He has a moderate eccentric stenosis in the first OM branch. This is a small to medium sized vessel. The lesion does not appear to be flow limiting. With upcoming surgery, we have discussed medical management of his CAD for now. He would prefer to attempt medical management for now. Will proceed with surgery next week. If he has chest pain c/w angina going forward, could bring him back for re-look cath and PCI of the OM which would be technically easy to do.       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 Echo 2017  with normal RV/LV function   Orhto:  Post right TKR Charlann Boxer 12/19/20     F/U with me in a year   Charlton Haws

## 2023-09-08 ENCOUNTER — Encounter: Payer: Self-pay | Admitting: Cardiovascular Disease

## 2023-09-08 ENCOUNTER — Ambulatory Visit: Payer: Medicare HMO | Attending: Cardiovascular Disease | Admitting: Cardiovascular Disease

## 2023-09-08 VITALS — BP 122/64 | HR 55 | Ht 72.0 in | Wt 183.0 lb

## 2023-09-08 DIAGNOSIS — E782 Mixed hyperlipidemia: Secondary | ICD-10-CM

## 2023-09-08 DIAGNOSIS — G250 Essential tremor: Secondary | ICD-10-CM

## 2023-09-08 DIAGNOSIS — I2510 Atherosclerotic heart disease of native coronary artery without angina pectoris: Secondary | ICD-10-CM | POA: Diagnosis not present

## 2023-09-08 NOTE — Patient Instructions (Signed)
Medication Instructions:  Your physician recommends that you continue on your current medications as directed. Please refer to the Current Medication list given to you today.  *If you need a refill on your cardiac medications before your next appointment, please call your pharmacy*  Lab Work: If you have labs (blood work) drawn today and your tests are completely normal, you will receive your results only by: MyChart Message (if you have MyChart) OR A paper copy in the mail If you have any lab test that is abnormal or we need to change your treatment, we will call you to review the results.  Follow-Up: At [facility], you and your health needs are our priority.  As part of our continuing mission to provide you with exceptional heart care, we have created designated Provider Care Teams.  These Care Teams include your primary Cardiologist (physician) and Advanced Practice Providers (APPs -  Physician Assistants and Nurse Practitioners) who all work together to provide you with the care you need, when you need it.  We recommend signing up for the patient portal called "MyChart".  Sign up information is provided on this After Visit Summary.  MyChart is used to connect with patients for Virtual Visits (Telemedicine).  Patients are able to view lab/test results, encounter notes, upcoming appointments, etc.  Non-urgent messages can be sent to your provider as well.   To learn more about what you can do with MyChart, go to [email or web address].    Your next appointment:   1 year(s)  Provider:   Peter Nishan, MD     

## 2023-12-27 ENCOUNTER — Other Ambulatory Visit (HOSPITAL_COMMUNITY): Payer: Self-pay | Admitting: Internal Medicine

## 2023-12-27 DIAGNOSIS — R1319 Other dysphagia: Secondary | ICD-10-CM

## 2024-01-08 ENCOUNTER — Ambulatory Visit (HOSPITAL_COMMUNITY)
Admission: RE | Admit: 2024-01-08 | Discharge: 2024-01-08 | Disposition: A | Discharge status: Home / Self Care | Payer: Medicare HMO | Source: Ambulatory Visit | Attending: Internal Medicine | Admitting: Internal Medicine

## 2024-01-08 DIAGNOSIS — R1319 Other dysphagia: Secondary | ICD-10-CM | POA: Diagnosis present

## 2024-04-28 ENCOUNTER — Emergency Department (HOSPITAL_COMMUNITY)

## 2024-04-28 ENCOUNTER — Emergency Department (HOSPITAL_COMMUNITY)
Admission: EM | Admit: 2024-04-28 | Discharge: 2024-04-29 | Disposition: A | Discharge status: Home / Self Care | Attending: Emergency Medicine | Admitting: Emergency Medicine

## 2024-04-28 DIAGNOSIS — W19XXXA Unspecified fall, initial encounter: Secondary | ICD-10-CM

## 2024-04-28 DIAGNOSIS — I10 Essential (primary) hypertension: Secondary | ICD-10-CM | POA: Diagnosis not present

## 2024-04-28 DIAGNOSIS — R251 Tremor, unspecified: Secondary | ICD-10-CM | POA: Diagnosis not present

## 2024-04-28 DIAGNOSIS — Z7982 Long term (current) use of aspirin: Secondary | ICD-10-CM | POA: Diagnosis not present

## 2024-04-28 DIAGNOSIS — Z87891 Personal history of nicotine dependence: Secondary | ICD-10-CM | POA: Insufficient documentation

## 2024-04-28 DIAGNOSIS — I2510 Atherosclerotic heart disease of native coronary artery without angina pectoris: Secondary | ICD-10-CM | POA: Diagnosis not present

## 2024-04-28 DIAGNOSIS — Z79899 Other long term (current) drug therapy: Secondary | ICD-10-CM | POA: Diagnosis not present

## 2024-04-28 DIAGNOSIS — U071 COVID-19: Secondary | ICD-10-CM | POA: Diagnosis not present

## 2024-04-28 DIAGNOSIS — R059 Cough, unspecified: Secondary | ICD-10-CM | POA: Diagnosis present

## 2024-04-28 LAB — CBC WITH DIFFERENTIAL/PLATELET
Abs Immature Granulocytes: 0.06 10*3/uL (ref 0.00–0.07)
Basophils Absolute: 0 10*3/uL (ref 0.0–0.1)
Basophils Relative: 0 %
Eosinophils Absolute: 0 10*3/uL (ref 0.0–0.5)
Eosinophils Relative: 0 %
HCT: 33.5 % — ABNORMAL LOW (ref 39.0–52.0)
Hemoglobin: 10.7 g/dL — ABNORMAL LOW (ref 13.0–17.0)
Immature Granulocytes: 1 %
Lymphocytes Relative: 7 %
Lymphs Abs: 0.8 10*3/uL (ref 0.7–4.0)
MCH: 28.1 pg (ref 26.0–34.0)
MCHC: 31.9 g/dL (ref 30.0–36.0)
MCV: 87.9 fL (ref 80.0–100.0)
Monocytes Absolute: 0.6 10*3/uL (ref 0.1–1.0)
Monocytes Relative: 5 %
Neutro Abs: 9.3 10*3/uL — ABNORMAL HIGH (ref 1.7–7.7)
Neutrophils Relative %: 87 %
Platelets: 349 10*3/uL (ref 150–400)
RBC: 3.81 MIL/uL — ABNORMAL LOW (ref 4.22–5.81)
RDW: 14.1 % (ref 11.5–15.5)
WBC: 10.8 10*3/uL — ABNORMAL HIGH (ref 4.0–10.5)
nRBC: 0 % (ref 0.0–0.2)

## 2024-04-28 LAB — BASIC METABOLIC PANEL WITH GFR
Anion gap: 9 (ref 5–15)
BUN: 24 mg/dL — ABNORMAL HIGH (ref 8–23)
CO2: 22 mmol/L (ref 22–32)
Calcium: 8.2 mg/dL — ABNORMAL LOW (ref 8.9–10.3)
Chloride: 101 mmol/L (ref 98–111)
Creatinine, Ser: 1.07 mg/dL (ref 0.61–1.24)
GFR, Estimated: >60 mL/min (ref >60)
Glucose, Bld: 118 mg/dL — ABNORMAL HIGH (ref 70–99)
Potassium: 4 mmol/L (ref 3.5–5.1)
Sodium: 132 mmol/L — ABNORMAL LOW (ref 135–145)

## 2024-04-28 NOTE — ED Triage Notes (Addendum)
 Patient BIB EMS for evaluation after a fall at home. Patient states legs buckled and he went to ground. Denies injury on arrival. Patient also complains of productive cough, general weakness, and burning during urination. Patient not on blood thinners.

## 2024-04-29 ENCOUNTER — Encounter (HOSPITAL_COMMUNITY): Payer: Self-pay

## 2024-04-29 ENCOUNTER — Other Ambulatory Visit: Payer: Self-pay

## 2024-04-29 LAB — RESP PANEL BY RT-PCR (RSV, FLU A&B, COVID)  RVPGX2
Influenza A by PCR: NEGATIVE
Influenza B by PCR: NEGATIVE
Resp Syncytial Virus by PCR: NEGATIVE
SARS Coronavirus 2 by RT PCR: POSITIVE — AB

## 2024-04-29 LAB — URINALYSIS, ROUTINE W REFLEX MICROSCOPIC
Bilirubin Urine: NEGATIVE
Glucose, UA: NEGATIVE mg/dL
Hgb urine dipstick: NEGATIVE
Ketones, ur: 5 mg/dL — AB
Leukocytes,Ua: NEGATIVE
Nitrite: NEGATIVE
Protein, ur: NEGATIVE mg/dL
Specific Gravity, Urine: 1.019 (ref 1.005–1.030)
pH: 5 (ref 5.0–8.0)

## 2024-04-29 NOTE — ED Provider Notes (Signed)
 [place] EMERGENCY DEPARTMENT AT [facility] Provider Note   CSN: [id number] Arrival date & time: 04/28/24  2215     History  No chief complaint on file.   Jay Bradshaw is a 89 y.o. male.  Patient with past medical history significant for tremors, hypertension, coronary artery disease presents to the emergency department via EMS complaining of needing evaluation after a fall.  He states he felt somewhat weak when he stood up and tried to sit back to the chair but missed and fell between the ottoman and the couch.  He does endorse bumping his head on the ottoman but states it was covered in blankets.  He denies any physical pain at this time.  Patient also complains of cough, fever, generalized weakness since Thursday.  He denies shortness of breath, chest pain, abdominal pain, nausea, vomiting.  HPI     Home Medications Prior to Admission medications   Medication Sig Start Date End Date Taking? Authorizing Provider  aspirin  EC 81 MG tablet Take 81 mg by mouth at bedtime. Swallow whole.    [provider]  beta carotene w/minerals (OCUVITE) tablet Take 1 tablet by mouth every other day. Alternate with multivitamin    [provider]  bisacodyl  (DULCOLAX) 5 MG EC tablet Take 5 mg by mouth daily as needed for moderate constipation.    [provider]  CALCIUM -MAGNESIUM  PO Take 500-800 mg by mouth 2 (two) times daily.     [provider]  cholecalciferol  (VITAMIN D) 400 UNITS TABS tablet Take 400 Units by mouth 2 (two) times daily.    [provider]  Coenzyme Q10 (CO Q10) 100 MG CAPS Take 200 mg by mouth daily.    [provider]  levothyroxine  (SYNTHROID , LEVOTHROID) 88 MCG tablet Take 88 mcg by mouth daily before breakfast.    [provider]  lovastatin (MEVACOR) 20 MG tablet Take 20 mg by mouth daily.     [provider]  Melatonin 3 MG TABS Take 3 mg by mouth at bedtime as needed (sleep).     [provider]  metoprolol  succinate (TOPROL -XL) 50 MG 24 hr tablet Take 50 mg by mouth at bedtime.     [provider]  Multiple Vitamin (MULTIVITAMIN) capsule Take 1 capsule by mouth every other day. Alternate with Ocuvite    [provider]  nitroGLYCERIN  (NITROSTAT ) 0.4 MG SL tablet Place 1 tablet (0.4 mg total) under the tongue every 5 (five) minutes as needed for chest pain (up to 3 doses MAX). 08/17/17   Nishan, Peter C, MD  Potassium 99 MG TABS Take 99 mg by mouth at bedtime.     [provider]  PSYLLIUM HUSK PO Take 1 capsule by mouth 3 (three) times daily as needed (depending on meals he ate that day). Gel caps    [provider]  saw palmetto  160 MG capsule Take 160 mg by mouth 3 (three) times daily.    [provider]  tamsulosin  (FLOMAX ) 0.4 MG CAPS capsule Take 0.4 mg by mouth See admin instructions. Monday midnight, Thursday morning at 8am and Saturday at 1600    [provider]  vitamin C  (ASCORBIC ACID ) 500 MG tablet Take 500 mg by mouth every other day. Alternate with vitamin E     [provider]  VITAMIN E  BLEND PO Take 300 Units by mouth every other day. Alternate with vitamin C     [provider]      Allergies  Oxycodone  and Primidone     Review of Systems   Review of Systems  Physical Exam Updated Vital Signs BP (!) 131/52   Pulse 83   Temp (!) 101.2 F (38.4 C) (Oral)   Resp 15   Ht 6' (1.829 m)   Wt 79.4 kg   SpO2 99%   BMI 23.73 kg/m  Physical Exam Vitals and nursing note reviewed.  Constitutional:      General: He is not in acute distress.    Appearance: He is well-developed.  HENT:     Head: Normocephalic and atraumatic.  Eyes:     Conjunctiva/sclera: Conjunctivae normal.  Cardiovascular:     Rate and Rhythm: Normal rate and regular rhythm.  Pulmonary:     Effort: Pulmonary effort is normal. No respiratory distress.     Breath sounds: Rhonchi present.  Abdominal:      Palpations: Abdomen is soft.     Tenderness: There is no abdominal tenderness.  Musculoskeletal:        General: No swelling.     Cervical back: Neck supple.  Skin:    General: Skin is warm and dry.     Capillary Refill: Capillary refill takes less than 2 seconds.  Neurological:     Mental Status: He is alert.     Comments: Patient with tremor in bilateral hands  Psychiatric:        Mood and Affect: Mood normal.     ED Results / Procedures / Treatments   Labs (all labs ordered are listed, but only abnormal results are displayed) Labs Reviewed  RESP PANEL BY RT-PCR (RSV, FLU A&B, COVID)  RVPGX2 - Abnormal; Notable for the following components:      Result Value   SARS Coronavirus 2 by RT PCR POSITIVE (*)    All other components within normal limits  BASIC METABOLIC PANEL WITH GFR - Abnormal; Notable for the following components:   Sodium 132 (*)    Glucose, Bld 118 (*)    BUN 24 (*)    Calcium  8.2 (*)    All other components within normal limits  CBC WITH DIFFERENTIAL/PLATELET - Abnormal; Notable for the following components:   WBC 10.8 (*)    RBC 3.81 (*)    Hemoglobin 10.7 (*)    HCT 33.5 (*)    Neutro Abs 9.3 (*)    All other components within normal limits  URINALYSIS, ROUTINE W REFLEX MICROSCOPIC - Abnormal; Notable for the following components:   APPearance HAZY (*)    Ketones, ur 5 (*)    All other components within normal limits    EKG EKG Interpretation Date/Time:  Sunday April 28 2024 22:54:57 EDT Ventricular Rate:  93 PR Interval:  164 QRS Duration:  94 QT Interval:  359 QTC Calculation: 447 R Axis:   77  Text Interpretation: Sinus rhythm Multiple ventricular premature complexes Confirmed by Eldon Greenland ([id number]) on 04/28/2024 11:07:24 PM  Radiology DG Chest 2 View Result Date: 04/28/2024 EXAM: 2 VIEW(S) XRAY OF THE CHEST 04/28/2024 11:22:58 PM COMPARISON: 12/19/2020 CLINICAL HISTORY: Fever, weakness. BIB EMS for evaluation after a fall at home.  Patient states legs buckled and he went to ground. also complains of productive cough, general weakness. FINDINGS: LUNGS AND PLEURA: No focal pulmonary opacity. No pulmonary edema. No pleural effusion. No pneumothorax. HEART AND MEDIASTINUM: No acute abnormality of the cardiac and mediastinal silhouettes. Thoracic aortic atherosclerosis. BONES AND SOFT TISSUES: No acute osseous abnormality. Bilateral shoulder arthroplasties. IMPRESSION: 1. No acute process. Electronically  signed by: Zadie Herter MD 04/28/2024 11:26 PM EDT RP Workstation: [id number]   CT Head Wo Contrast Result Date: 04/28/2024 CLINICAL DATA:  Head trauma, minor (Age >= 65y); Neck trauma (Age >= 65y). Fall EXAM: CT HEAD WITHOUT CONTRAST CT CERVICAL SPINE WITHOUT CONTRAST TECHNIQUE: Multidetector CT imaging of the head and cervical spine was performed following the standard protocol without intravenous contrast. Multiplanar CT image reconstructions of the cervical spine were also generated. RADIATION DOSE REDUCTION: This exam was performed according to the departmental dose-optimization program which includes automated exposure control, adjustment of the mA and/or kV according to patient size and/or use of iterative reconstruction technique. COMPARISON:  03/16/2018 FINDINGS: CT HEAD FINDINGS Brain: Normal anatomic configuration. Parenchymal volume loss is commensurate with the patient's age. Stable mild periventricular white matter changes are present likely reflecting the sequela of small vessel ischemia. 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 No abnormal intra or extra-axial mass lesion or fluid collection. No abnormal mass effect or midline shift. No evidence of acute intracranial hemorrhage or infarct. Ventricular size is normal. Cerebellum unremarkable. Vascular: No asymmetric hyperdense vasculature at the skull base. Skull: Intact Sinuses/Orbits: Dense opacification of the right maxillary sinus with high density  intraluminal contents and thickening and sclerosis of the sinus walls in keeping with chronic sinusitis. Remaining paranasal sinuses are clear. Orbits are unremarkable. Other: Mastoid air cells and middle ear cavities are clear. CT CERVICAL SPINE FINDINGS Alignment: Reversal of the normal cervical lordosis with 2-3 mm anterolisthesis C3-4 and C4-5, likely degenerative in nature. Skull base and vertebrae: Motion artifact limits evaluation of C1 and C2. No definite acute fracture of the cervical spine. Craniocervical alignment is normal. The atlantodental interval is not widened. Soft tissues and spinal canal: No prevertebral fluid or swelling. No visible canal hematoma. Disc levels: Intervertebral disc space narrowing and endplate remodeling is seen throughout the cervical spine, most severe at C4-T1 in keeping with changes of advanced degenerative disc disease. Prevertebral soft tissues are not thickened on sagittal reformats. Multilevel uncovertebral and facet arthrosis results in multilevel moderate to severe foraminal narrowing., most severe on the right at C2-3 and C3-4 Upper chest: Negative. Other: None IMPRESSION: 1. No acute intracranial abnormality. 2. No definite acute fracture of the cervical spine. 3. Advanced multilevel degenerative changes of the cervical spine with multilevel moderate to severe foraminal narrowing, most severe on the right at C2-3 and C3-4. 4. Chronic right maxillary sinusitis. Electronically Signed   By: Worthy Heads M.D.   On: 04/28/2024 23:09   CT Cervical Spine Wo Contrast Result Date: 04/28/2024 CLINICAL DATA:  Head trauma, minor (Age >= 65y); Neck trauma (Age >= 65y). Fall EXAM: CT HEAD WITHOUT CONTRAST CT CERVICAL SPINE WITHOUT CONTRAST TECHNIQUE: Multidetector CT imaging of the head and cervical spine was performed following the standard protocol without intravenous contrast. Multiplanar CT image reconstructions of the cervical spine were also generated. RADIATION DOSE  REDUCTION: This exam was performed according to the departmental dose-optimization program which includes automated exposure control, adjustment of the mA and/or kV according to patient size and/or use of iterative reconstruction technique. COMPARISON:  03/16/2018 FINDINGS: CT HEAD FINDINGS Brain: Normal anatomic configuration. Parenchymal volume loss is commensurate with the patient's age. Stable mild periventricular white matter changes are present likely reflecting the sequela of small vessel ischemia. 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 No abnormal intra or extra-axial mass lesion or fluid collection. No abnormal mass effect or midline shift. No evidence of acute intracranial hemorrhage or  infarct. Ventricular size is normal. Cerebellum unremarkable. Vascular: No asymmetric hyperdense vasculature at the skull base. Skull: Intact Sinuses/Orbits: Dense opacification of the right maxillary sinus with high density intraluminal contents and thickening and sclerosis of the sinus walls in keeping with chronic sinusitis. Remaining paranasal sinuses are clear. Orbits are unremarkable. Other: Mastoid air cells and middle ear cavities are clear. CT CERVICAL SPINE FINDINGS Alignment: Reversal of the normal cervical lordosis with 2-3 mm anterolisthesis C3-4 and C4-5, likely degenerative in nature. Skull base and vertebrae: Motion artifact limits evaluation of C1 and C2. No definite acute fracture of the cervical spine. Craniocervical alignment is normal. The atlantodental interval is not widened. Soft tissues and spinal canal: No prevertebral fluid or swelling. No visible canal hematoma. Disc levels: Intervertebral disc space narrowing and endplate remodeling is seen throughout the cervical spine, most severe at C4-T1 in keeping with changes of advanced degenerative disc disease. Prevertebral soft tissues are not thickened on sagittal reformats. Multilevel uncovertebral and facet arthrosis  results in multilevel moderate to severe foraminal narrowing., most severe on the right at C2-3 and C3-4 Upper chest: Negative. Other: None IMPRESSION: 1. No acute intracranial abnormality. 2. No definite acute fracture of the cervical spine. 3. Advanced multilevel degenerative changes of the cervical spine with multilevel moderate to severe foraminal narrowing, most severe on the right at C2-3 and C3-4. 4. Chronic right maxillary sinusitis. Electronically Signed   By: Worthy Heads M.D.   On: 04/28/2024 23:09    Procedures Procedures    Medications Ordered in ED Medications - No data to display  ED Course/ Medical Decision Making/ A&P                                 Medical Decision Making Amount and/or Complexity of Data Reviewed Labs: ordered. Radiology: ordered.   This patient presents to the ED for concern of a fall and viral symptoms, this involves an extensive number of treatment options, and is a complaint that carries with it a high risk of complications and morbidity.  The differential diagnosis includes COVID-19, influenza, RSV, other viral process, pneumonia, intracranial abnormality, fracture, dislocation, others   Co morbidities / Chronic conditions that complicate the patient evaluation  Tremors, hypertension   Additional history obtained:  Additional history obtained from EMR   Lab Tests:  I Ordered, and personally interpreted labs.  The pertinent results include: Positive for COVID-19   Imaging Studies ordered:  I ordered imaging studies including chest x-ray, CT of the head and cervical spine I independently visualized and interpreted imaging which showed no acute findings on chest x-ray, CT shows: 1. No acute intracranial abnormality.  2. No definite acute fracture of the cervical spine.  3. Advanced multilevel degenerative changes of the cervical spine  with multilevel moderate to severe foraminal narrowing, most severe  on the right at C2-3 and  C3-4.  4. Chronic right maxillary sinusitis.   I agree with the radiologist interpretation   Cardiac Monitoring: / EKG:  The patient was maintained on a cardiac monitor.  I personally viewed and interpreted the cardiac monitored which showed an underlying rhythm of: Sinus rhythm with PVCs   Social Determinants of Health:  Patient is a former smoker   Test / Admission - Considered:  Patient with no acute findings on imaging.  Workup consistent with COVID-19 infection.  Patient was able to drink water  without difficulty and ambulated without assistance to the bathroom.  I discussed antiviral therapy with the patient but the patient declined antivirals at this time.  Patient appears stable for discharge home.  Return precautions provided.         Final Clinical Impression(s) / ED Diagnoses Final diagnoses:  Fall, initial encounter  COVID-19    Rx / DC Orders ED Discharge Orders     None         Delories Fetter 04/29/24 0109    Eldon Greenland, MD 04/29/24 [phone]

## 2024-04-29 NOTE — Discharge Instructions (Addendum)
 Your workup shows a Covid-19 infection.  You may use Tylenol  or ibuprofen at home for fever and pain control.  Be sure to hydrate and rest.  Follow-up as needed with your primary care provider.  If you develop any life-threatening symptoms please return to the emergency department.

## 2024-04-29 NOTE — ED Notes (Addendum)
 Patient able to tolerate fluids. Patient OOB and ambulated in hallway to restroom.

## 2024-06-04 ENCOUNTER — Other Ambulatory Visit: Payer: Self-pay | Admitting: Student

## 2024-06-04 DIAGNOSIS — R109 Unspecified abdominal pain: Secondary | ICD-10-CM

## 2024-06-07 ENCOUNTER — Encounter: Payer: Self-pay | Admitting: Student

## 2024-06-12 ENCOUNTER — Ambulatory Visit
Admission: RE | Admit: 2024-06-12 | Discharge: 2024-06-12 | Disposition: A | Discharge status: Home / Self Care | Source: Ambulatory Visit | Attending: Student | Admitting: Student

## 2024-06-12 DIAGNOSIS — R109 Unspecified abdominal pain: Secondary | ICD-10-CM

## 2024-06-12 MED ORDER — IOPAMIDOL (ISOVUE-300) INJECTION 61%
100.0000 mL | Freq: Once | INTRAVENOUS | Status: AC | PRN
Start: 2024-06-12 — End: 2024-06-12
  Administered 2024-06-12: 100 mL via INTRAVENOUS

## 2024-10-11 NOTE — Progress Notes (Signed)
 Assessment/Plan:    1.  Essential Tremor  -DaTscan  completed August, 2023 was normal.  -pt not able to tolerate primidone .   -on beta blocker already from primary care so cannot give propranolol for tremor  -discussed that the above 2 meds are the only first line meds for tremor  -has some features of parkinsonism but DaTscan  negative.  Will do MRI to look at vascular burden but discussed starting levodopa in the meantime.  He wants to hold on that until after the first of the year.  Will call him at that time.  - Patient asks about why ibuprofen helps his symptoms.  I think that it is likely the ibuprofen just helps his arthritis and he just moves better when he takes it, but otherwise I really had no great explanation.  - Patient describes these events where he has trouble walking and has more shaking and likely freezing as seizures.  I discussed with the patient that these are not seizures and it is probably best not to use that term.  - Patient and I did discuss that essential tremor alone can affect cerebellar climbing fibers and affect balance.   Subjective:   Jay Bradshaw was seen today in follow up for essential tremor.  My previous records were reviewed prior to todays visit.  I last saw the patient about a year and a half ago, at which point we decided to just follow-up on an as-needed basis.  He felt that primidone  caused tinnitus.  He was already on a beta-blocker.  He was not ready for second line medications at that point in time.  Today, he reports that tremor is worse/more intense and it seems to involved my ability to locomote.  Recently, he was at grocery and carrying bag in the R hand and he was parked far away and then he suddenly felt like he couldn't walk well.  He felt like steps were short and I felt unsure of myself.  He felt like my feet were immobilized but if I took a bigger step, I would fall.  He didn't fall but he was tremulous.  When he got home, he still  had some trouble ambulating normally.  He notes if he takes an ibuprofen, but not tylenol , his tremor will be reduced within 10 minutes.   He had another event where he was trying to get off of the toilet and he felt his legs were immobilized and he could not get up.    He did have a fall for which he was in the emergency room back in June.  He tried to get up from the couch and was a bit weak and was trying to avoid tripping on something and fell and ER records indicate he his head on the ottoman but pt denies that today.  It turns out that the patient was positive for COVID-19.  He was evaluated in the emergency room and released.  Current prescribed movement disorder medications: Toprol  XL, 50 mg (prescribed by primary care, not for tremor)   PREVIOUS MEDICATIONS: primidone  (stated caused tinnitus, UTI, rash, achy joints and he d/c it.  Tinnitus lasted for months after d/c)  ALLERGIES:   Allergies  Allergen Reactions   Oxycodone  Other (See Comments)    Difficulty urinating for 24 plus hours   Primidone  Rash, Swelling and Tinitus    CURRENT MEDICATIONS:  Current Meds  Medication Sig   aspirin  EC 81 MG tablet Take 81 mg by mouth at bedtime.  Swallow whole.   beta carotene w/minerals (OCUVITE) tablet Take 1 tablet by mouth every other day. Alternate with multivitamin   bisacodyl  (DULCOLAX) 5 MG EC tablet Take 5 mg by mouth daily as needed for moderate constipation.   CALCIUM -MAGNESIUM  PO Take 500-800 mg by mouth 2 (two) times daily.    cholecalciferol  (VITAMIN D) 400 UNITS TABS tablet Take 400 Units by mouth 2 (two) times daily.   Coenzyme Q10 (CO Q10) 100 MG CAPS Take 200 mg by mouth daily.   finasteride (PROSCAR) 5 MG tablet Take 5 mg by mouth daily.   levothyroxine  (SYNTHROID , LEVOTHROID) 88 MCG tablet Take 88 mcg by mouth daily before breakfast.   lovastatin (MEVACOR) 20 MG tablet Take 20 mg by mouth daily.    Melatonin 3 MG TABS Take 3 mg by mouth at bedtime as needed (sleep).     metoprolol  succinate (TOPROL -XL) 50 MG 24 hr tablet Take 50 mg by mouth at bedtime.    Multiple Vitamin (MULTIVITAMIN) capsule Take 1 capsule by mouth every other day. Alternate with Ocuvite   Potassium 99 MG TABS Take 99 mg by mouth at bedtime.    PSYLLIUM HUSK PO Take 1 capsule by mouth 3 (three) times daily as needed (depending on meals he ate that day). Gel caps   saw palmetto  160 MG capsule Take 160 mg by mouth 3 (three) times daily.   tamsulosin  (FLOMAX ) 0.4 MG CAPS capsule Take 0.4 mg by mouth See admin instructions. Monday midnight, Thursday morning at 8am and Saturday at 1600   vitamin C  (ASCORBIC ACID ) 500 MG tablet Take 500 mg by mouth every other day. Alternate with vitamin E    VITAMIN E  BLEND PO Take 300 Units by mouth every other day. Alternate with vitamin C     Objective:    PHYSICAL EXAMINATION:    VITALS:   Vitals:   10/15/24 1243  BP: (!) 144/70  Pulse: 86  SpO2: 92%  Weight: 173 lb (78.5 kg)  Height: 6' (1.829 m)    GEN:  The patient appears stated age and is in NAD. HEENT:  Normocephalic, atraumatic.  The mucous membranes are moist. The superficial temporal arteries are without ropiness or tenderness. CV:  RRR Lungs:  CTAB Neck/HEME:  There are no carotid bruits bilaterally.  Neurological examination:  Orientation: The patient is alert and oriented x3. Cranial nerves: There is good facial symmetry. The speech is fluent and clear. Soft palate rises symmetrically and there is no tongue deviation. Hearing is intact to conversational tone. Sensation: Sensation is intact to light touch throughout Motor: Strength is at least antigravity x4.  Movement examination: Tone: There is nl tone in the bilateral upper extremities.  The tone in the lower extremities is nl.  Abnormal movements: there is bilateral UE rest tremor,R>L today  This does not increase with distraction procedures.  There is postural tremor.  There is intention tremor.   Coordination:  There is  decremation with toe taps on the L only Gait and Station: Patient is wide-based.  He is short stepped.  He slightly drags the left leg. I have reviewed and interpreted the following labs independently   Chemistry      Component Value Date/Time   NA 132 (L) 04/28/2024 2228   K 4.0 04/28/2024 2228   CL 101 04/28/2024 2228   CO2 22 04/28/2024 2228   BUN 24 (H) 04/28/2024 2228   CREATININE 1.07 04/28/2024 2228   CREATININE 0.93 08/26/2016 1541      Component Value  Date/Time   CALCIUM  8.2 (L) 04/28/2024 2228   ALKPHOS 62 12/19/2020 1048   AST 21 12/19/2020 1048   ALT 16 12/19/2020 1048   BILITOT 1.0 12/19/2020 1048      Lab Results  Component Value Date   WBC 10.8 (H) 04/28/2024   HGB 10.7 (L) 04/28/2024   HCT 33.5 (L) 04/28/2024   MCV 87.9 04/28/2024   PLT 349 04/28/2024   No results found for: TSH   Chemistry      Component Value Date/Time   NA 132 (L) 04/28/2024 2228   K 4.0 04/28/2024 2228   CL 101 04/28/2024 2228   CO2 22 04/28/2024 2228   BUN 24 (H) 04/28/2024 2228   CREATININE 1.07 04/28/2024 2228   CREATININE 0.93 08/26/2016 1541      Component Value Date/Time   CALCIUM  8.2 (L) 04/28/2024 2228   ALKPHOS 62 12/19/2020 1048   AST 21 12/19/2020 1048   ALT 16 12/19/2020 1048   BILITOT 1.0 12/19/2020 1048         Total time spent on today's visit was 43 minutes, including both face-to-face time and nonface-to-face time.  Time included that spent on review of records (prior notes available to me/labs/imaging if pertinent), discussing treatment and goals, answering patient's questions and coordinating care.  Cc:  Larnell Hamilton, MD

## 2024-10-15 ENCOUNTER — Ambulatory Visit: Admitting: Neurology

## 2024-10-15 ENCOUNTER — Encounter: Payer: Self-pay | Admitting: Neurology

## 2024-10-15 VITALS — BP 144/70 | HR 86 | Ht 72.0 in | Wt 173.0 lb

## 2024-10-15 DIAGNOSIS — R251 Tremor, unspecified: Secondary | ICD-10-CM

## 2024-10-15 DIAGNOSIS — R2681 Unsteadiness on feet: Secondary | ICD-10-CM | POA: Diagnosis not present

## 2024-10-15 NOTE — Patient Instructions (Addendum)
 We will do the MRI brain in January We will start carbidopa/levodopa 25/100 (this is the parkinsons medication we discussed) in January.  I will send you a RX for this in January and call you then.  When we do it, the titration schedule will be as follows:  Take 1/2 tablet three times daily, at least 30 minutes before meals (approximately 10am/2pm/6pm), for one week Then take 1/2 tablet in the morning, 1/2 tablet in the afternoon, 1 tablet in the evening, at least 30 minutes before meals, for one week Then take 1/2 tablet in the morning, 1 tablet in the afternoon, 1 tablet in the evening, at least 30 minutes before meals, for one week Then take 1 tablet three times daily at 10am/2pm/6pm, at least 30 minutes before meals   As a reminder, carbidopa/levodopa can be taken at the same time as a carbohydrate, but we like to have you take your pill either 30 minutes before a protein source or 1 hour after as protein can interfere with carbidopa/levodopa absorption.

## 2024-10-30 NOTE — Progress Notes (Unsigned)
 Cardiology Office Note    Date:  11/06/2024   ID:  Jay Bradshaw, DOB Jan 23, 1935, MRN [id number]  PCP:  Larnell Hamilton, MD  Cardiologist:  Dr. Delford  CC: follow up   History of Present Illness:  Jay Bradshaw is a 89 y.o. male with a history of non obstructive CAD, emphysema, essential tremor, hypothyroidism who presents to clinic for cardiology follow up.  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 He moved to [place] from [place] . He established care with me  in 08/2015. He had reported having a cath in 2008 with no interventions.  LHC on 09/25/15 showed 20% prox RCA, 20% mRCA, 20% dLAD, 60% 1st Marg with moderate eccentric stenosis that did not appear to be flow limiting. Per cath report, PCI would be straight forward to do if he had chest pain c/w angina moving forward. Subsequently had uncomplicated left shoulder surgery Dr Josefina 10/02/2015   Seen by PA 08/2016 with chest pain Cath set up with Dr Verlin  Reviewed films: moderate disease in small OM with negative  FFR medical Rx   Some stress from wifes failing health No chest pain.   Daughter lives in [place] And has 30 yo twins  Echo: 09/08/16 EF 60-65% grade 2 diastolic dysfunction mild RAE Reviewed  Had right TKR Dr Ernie 12/19/20 with no cardiac complications Had appendicitis with surgery 12/2020  Feels his tremor may be worse in UE;s seen by Dr Tat DAT Scan negative and did not tolerate Primidone    Hospitalized 10/2024 with fall and rectus sheath hematoma Received a unit of blood Baby aspirin  for CAD held   Had an episode of SSCP in October lasting 24 hours and another shorter one in November Not like his typical angina   Past Medical History:  Diagnosis Date   Anxiety    Arthritis    Balance problem    more noticed in the dark   Complication of anesthesia    opioids causes urine retention   Constipation    takes Colace daily as needed   COPD (chronic obstructive pulmonary disease) (HCC)    Coronary  artery disease    takes Metoprolol  daily, Prox RCA lesion, 20 %stenosed, Mid RCA lesion 20% stenosed, Dist LAD lesion 20% stenosed EF greater than 65%,, 1st mrg lesion 60% stenosed, mild non-obstructive disease in LAD and RCA, Stenosis of OM Branch appears moderate   Emphysema lung (HCC)    Enlarged prostate    takes Tamsulosin  daily   History of echocardiogram 09/08/2016   EF 60-65% grade 2 diastolic dysfunction mild RAE Reviewed   Hyperlipidemia    takes Lovastatin daily   Hypertension    elevated BP but not on meds   Hypothyroidism    takes Synthroid  daily   Joint pain    Osteoarthritis of left shoulder 09/29/2015   Osteoarthritis of right shoulder, primary 12/16/2014   PONV (postoperative nausea and vomiting)    Productive cough    Seasonal allergies    Shortness of breath dyspnea    Tremors of nervous system    essential tremors   Urinary frequency    takes Ditropan  daily    Past Surgical History:  Procedure Laterality Date   CARDIAC CATHETERIZATION N/A 09/25/2015   Procedure: Left Heart Cath and Coronary Angiography;  Surgeon: Lonni JONETTA Verlin, MD;  Location: [facility] INVASIVE CV LAB;  Service: Cardiovascular;  Laterality: N/A;   CARDIAC CATHETERIZATION N/A 08/29/2016   Procedure: Left Heart Cath and Coronary Angiography;  Surgeon: Lonni  JONETTA Cash, MD;  Location: [facility] INVASIVE CV LAB;  Service: Cardiovascular;  Laterality: N/A;   CATARACT EXTRACTION  Aug and Sep 2011   first left eye, then right eye   COLONOSCOPY  June 2008   Dr. Gladis Kays ref by Colonel GRIMES WITH INSERTION OF UROLIFT N/A 11/09/2017   Procedure: CYSTOSCOPY WITH INSERTION OF UROLIFT;  Surgeon: Matilda Senior, MD;  Location: [facility];  Service: Urology;  Laterality: N/A;   excision of fibroma  October 1994   in skin on back of neck right of midline, Norleen MYRTIS Sprung ref by Rolene   HERNIA REPAIR     IRRIGATION AND DEBRIDEMENT SEBACEOUS CYST  2010   from the back, [facility]   LAPAROSCOPIC APPENDECTOMY N/A 12/19/2020   Procedure: APPENDECTOMY LAPAROSCOPIC;  Surgeon: Signe Mitzie LABOR, MD;  Location: [facility] OR;  Service: General;  Laterality: N/A;   LIPOMA EXCISION  Nov 2004   from neck, right side (near location of mandible cyst), Dr Sheena Sharps ref by Rasalen   PARTIAL KNEE ARTHROPLASTY Right 09/22/2020   Procedure: UNICOMPARTMENTAL KNEE;  Surgeon: Josefina Chew, MD;  Location: [facility] ORS;  Service: Orthopedics;  Laterality: Right;   PROSTATE BIOPSY  2006   Dr. Kayla Minott   SHOULDER HEMI-ARTHROPLASTY Right 12/16/2014   Procedure: SHOULDER HEMI-ARTHROPLASTY;  Surgeon: Chew SHAUNNA Josefina, MD;  Location: [facility] OR;  Service: Orthopedics;  Laterality: Right;   SHOULDER SURGERY Right 12/16/2014   hemi-arthroplasty    dr josefina   thyroid adenoma removal  August 1970   right side, [facility]   TOTAL SHOULDER ARTHROPLASTY Right 12/16/2014   Procedure: RIGHT TOTAL SHOULDER ARTHROPLASTY;  Surgeon: Chew SHAUNNA Josefina, MD;  Location: [facility] OR;  Service: Orthopedics;  Laterality: Right;   TOTAL SHOULDER ARTHROPLASTY Left 09/29/2015   TOTAL SHOULDER ARTHROPLASTY Left 09/29/2015   Procedure: TOTAL LEFT SHOULDER ARTHROPLASTY;  Surgeon: Chew Josefina, MD;  Location: [facility] OR;  Service: Orthopedics;  Laterality: Left;   vacuolar cyst removal     in right mandible fixed/filled, [place] Rolene ref by Mariella, DDS   wisdom teeth extracted      Current Medications: Outpatient Medications Prior to Visit  Medication Sig Dispense Refill   aspirin  EC 81 MG tablet Take 81 mg by mouth at bedtime. Swallow whole.     beta carotene w/minerals (OCUVITE) tablet Take 1 tablet by mouth every other day. Alternate with multivitamin     bisacodyl  (DULCOLAX) 5 MG EC tablet Take 5 mg by mouth daily as needed for moderate constipation.     CALCIUM -MAGNESIUM  PO Take 500-800 mg by mouth 2 (two) times daily.      cholecalciferol  (VITAMIN D) 400 UNITS TABS tablet Take 400 Units by mouth 2 (two)  times daily.     Coenzyme Q10 (CO Q10) 100 MG CAPS Take 200 mg by mouth daily.     Ferrous Sulfate Dried (FEOSOL) 200 (65 Fe) MG TABS      finasteride (PROSCAR) 5 MG tablet Take 5 mg by mouth daily.     levothyroxine  (SYNTHROID , LEVOTHROID) 88 MCG tablet Take 88 mcg by mouth daily before breakfast.     lovastatin (MEVACOR) 20 MG tablet Take 20 mg by mouth daily.      Melatonin 3 MG TABS Take 3 mg by mouth at bedtime as needed (sleep).      metoprolol  succinate (TOPROL -XL) 50 MG 24 hr tablet Take 50 mg by mouth at bedtime.      Multiple Vitamin (MULTIVITAMIN) capsule  Take 1 capsule by mouth every other day. Alternate with Ocuvite     Potassium 99 MG TABS Take 99 mg by mouth at bedtime.      PSYLLIUM HUSK PO Take 1 capsule by mouth 3 (three) times daily as needed (depending on meals he ate that day). Gel caps     tamsulosin  (FLOMAX ) 0.4 MG CAPS capsule Take 0.4 mg by mouth See admin instructions. Monday midnight, Thursday morning at 8am and Saturday at 1600     vitamin C  (ASCORBIC ACID ) 500 MG tablet Take 500 mg by mouth every other day. Alternate with vitamin E      VITAMIN E  BLEND PO Take 300 Units by mouth every other day. Alternate with vitamin C      nitroGLYCERIN  (NITROSTAT ) 0.4 MG SL tablet Place 1 tablet (0.4 mg total) under the tongue every 5 (five) minutes as needed for chest pain (up to 3 doses MAX). 25 tablet 3   saw palmetto  160 MG capsule Take 160 mg by mouth 3 (three) times daily.     No facility-administered medications prior to visit.     Allergies:   Oxycodone  and Primidone    Social History   Socioeconomic History   Marital status: Married    Spouse name: Not on file   Number of children: Not on file   Years of education: Not on file   Highest education level: Not on file  Occupational History   Occupation: retired    Comment: biology faculty [employer]  Tobacco Use   Smoking status: Former    Current packs/day: 0.00    Types: Cigarettes    Quit date:  11/02/1971    Years since quitting: 53.0   Smokeless tobacco: Never  Vaping Use   Vaping status: Never Used  Substance and Sexual Activity   Alcohol use: Yes    Comment: 1 time per week   Drug use: No   Sexual activity: Never  Other Topics Concern   Not on file  Social History Narrative   Right handed    Lives with wife in one story home   Caffeine 2 cups one in AM one after supper   Social Drivers of Corporate Investment Banker Strain: Not on file  Food Insecurity: Not on file  Transportation Needs: No Transportation Needs (10/29/2024)   Received from [facility]   PRAPARE - Transportation    In the past 12 months, has lack of transportation kept you from medical appointments or from getting medications?: No    In the past 12 months, has lack of transportation kept you from meetings, work, or from getting things needed for daily living?: No  Physical Activity: Not on file  Stress: Not on file  Social Connections: Not on file     Family History:  The patient's family history includes Breast cancer in his mother; Hypertension in his mother; Prostate cancer in his brother; Tremor in his brother, daughter, and father.     ROS:   Please see the history of present illness.    ROS All other systems reviewed and are negative.   PHYSICAL EXAM:   VS:  BP 132/73 (BP Location: Left Arm, Patient Position: Sitting, Cuff Size: Normal)   Pulse 75   Ht 6' (1.829 m)   Wt 168 lb 12.8 oz (76.6 kg)   SpO2 97%   BMI 22.89 kg/m    Affect appropriate Healthy:  appears stated age HEENT: normal Neck supple with no adenopathy JVP normal  no bruits no thyromegaly Lungs clear with no wheezing and good diaphragmatic motion Heart:  S1/S2 no murmur, no rub, gallop or click PMI normal Abdomen: benighn, BS positve, post RLQ appendectomy  Distal pulses intact with no bruits Trace LE  edema Neuro non-focal essential UE tremors  Post right TKR   Wt Readings from Last 3 Encounters:   11/06/24 168 lb 12.8 oz (76.6 kg)  10/15/24 173 lb (78.5 kg)  04/28/24 175 lb (79.4 kg)      Studies/Labs Reviewed:   EKG:  SR rate 61 normal 08/05/20  11/06/2024 SR Rate 65 normal   Recent Labs: 04/28/2024: BUN 24; Creatinine, Ser 1.07; Hemoglobin 10.7; Platelets 349; Potassium 4.0; Sodium 132   Lipid Panel No results found for: CHOL, TRIG, HDL, CHOLHDL, VLDL, LDLCALC, LDLDIRECT  Additional studies/ records that were reviewed today include:  Myoview 09/21/16 Study Highlights  The left ventricular ejection fraction is normal (55-65%). Nuclear stress EF: 65%. ST segment depression was noted during stress in the II, III, aVF, V5 and V6 leads. This is a low risk study.   Low risk stress nuclear study with ECG changes; small, moderate intensity, reversible inferior basal defect consistent with mild inferior ischemia; EF 65 with normal wall motion.       Cardiac catheterization 08/29/16  Conclusion   Prox RCA lesion, 20% stenosed. Mid RCA lesion, 20% stenosed. Dist LAD lesion, 20% stenosed. The left ventricular systolic function is normal. 1st Mrg lesion, 60% stenosed. The vessel is small to moderate in size. The stenosis is eccentric and does not appear to be flow limiting.   Recommendations: He has a moderate eccentric stenosis in the first OM branch. This is a small to medium sized vessel. The lesion does not appear to be flow limiting. With upcoming surgery, we have discussed medical management of his CAD for now. He would prefer to attempt medical management for now. Will proceed with surgery next week. If he has chest pain c/w angina going forward, could bring him back for re-look cath and PCI of the OM which would be technically easy to do.       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 Ibuprofen seems to help mobility likely arthritic component   LE edema: now resolved. Echo 2017  with normal RV/LV function   Orhto:  Post right TKR [facility] 12/19/20   Rectus Hematoma:  10/2024 post mechanical fall Transfused a unit of blood no surgery indicated    Lexiscan Myovue    F/U with me in a year   [employer]

## 2024-10-30 NOTE — Progress Notes (Signed)
 [facility] Hospitalist  PROGRESS NOTE Epic Chat Secure Text preferred   Admit Date: 10/28/2024   Today's Date: 10/30/2024  Length of Stay: 2 Code Status: Full Code  Patient's PCP: No Pcp  Allergies: Patient has no known allergies.  Isolation: No active isolations  Subjective:  Patient was seen and examined at bedside today. His WBC is trending up, no fever. He complained of dysuria but reports that he cannot differentiate if it is new or due to his BPH.   Clinical Events Summary: Patient is an 89 year old male with past medical history of heart per lipidemia, hypothyroidism, BPH, being worked up for possible Parkinson disease presented with abdominal pain after fall.  Imaging revealed right rectus sheath intramuscular hematoma.  He had acute blood loss anemia with hemoglobin of 8.3 from baseline of 10.  He received 1 unit PRBC.  He was evaluated by surgery and recommendation for abdominal binder.  Review of Systems  As above  Objective:  Vitals:   10/29/24 2000 10/29/24 2300 10/30/24 0355 10/30/24 0730  BP: 118/59 151/72 133/68 139/78  BP Location: Left arm Left arm Left arm Left arm  Patient Position: Sitting Sitting Sitting Sitting  Pulse: 74 74 79 76  Resp: 15 17 16 18   Temp: 36.1 C (97 F) 36.1 C (97 F) 36.4 C (97.5 F) 36.1 C (97 F)  TempSrc: Temporal Temporal Temporal Temporal  SpO2: 95% 98% 95% 95%  Weight:      Height:       I/O last 3 completed shifts: In: - (0 mL/kg)  Out: 1255 (15.8 mL/kg) [Urine:1255 (0.4 mL/kg/hr)] Weight: 79.4 kg  I/O this shift: In: 240 (3 mL/kg) [P.O.:240] Out: - (0 mL/kg)  Weight: 79.4 kg   Physical Exam General: no acute distress Respiratory: non labored breathing CVS: normal heart rate Abdomen: soft Neuro:Awake, alert  Current Medications: Current Medications[1]   Labs: Personally reviewed by me today  Recent Results (from the past 24 hours)  Basic metabolic panel   Collection Time: 10/30/24  2:52 AM  Result Value Ref  Range   Sodium 138 136 - 145 mmol/L   Potassium 4.2 3.4 - 5.1 mmol/L   Chloride 103 98 - 107 mmol/L   CO2 27 20 - 31 mmol/L   BUN 19 9 - 23 mg/dL   Creatinine 9.11 9.29 - 1.30 mg/dL   Glucose 879 (H) 74 - 106 mg/dL   Calcium  8.5 (L) 8.7 - 10.4 mg/dL   Anion Gap 8 5 - 15 mmol/L   eGFR >60 >=60 mL/min/1.38m*2   BUN/Creatinine Ratio 22    Osmolality Calc 279 mosm/kg  CBC auto differential   Collection Time: 10/30/24  2:52 AM  Result Value Ref Range   WBC 13.3 (H) 4.8 - 10.8 10*3/uL   RBC 3.29 (L) 4.70 - 6.10 10*6/uL   Hemoglobin 9.0 (L) 14.0 - 18.0 g/dL   Hematocrit 71.5 (L) 59.9 - 54.0 %   MCV 86.3 80.0 - 94.0 fL   MCH 27.4 27.0 - 34.0 pg   MCHC 31.7 31.5 - 36.0 g/dL   RDW 83.2 (H) 88.4 - 85.4 %   MPV 10.1 7.4 - 10.4 fL   Platelets 395 130 - 400 10*3/uL   Neutrophils Relative 79.5 (H) 34.6 - 71.4 %   Neutrophils Absolute 10.6 (H) 1.8 - 7.3 10*3/uL   Lymphocytes Relative 11.1 (L) 19.6 - 52.7 %   Lymphocytes Absolute 1.5 1.5 - 4.0 10*3/uL   Monocytes Relative 6.0 2.4 - 11.8 %   Monocytes Absolute  0.8 0.2 - 1.0 10*3/uL   Eosinophils Relative 2.5 0.0 - 7.8 %   Eosinophils Absolute 0.3 0.0 - 0.7 10*3/uL   Basophils Relative 0.4 0.0 - 1.8 %   Basophils Absolute 0.1 0.0 - 0.2 10*3/uL     CT Abd & Pelvis IV contrast only EXAM:  CT Abdomen and Pelvis With Intravenous Contrast  CLINICAL HISTORY:  The patient is 89 years old and is Male; pain/fall  TECHNIQUE:  Axial computed tomography images of the abdomen and pelvis with intravenous contrast.  Sagittal and coronal reformatted images were created and reviewed.  This CT exam was performed using one or more of the following dose reduction techniques:  automated exposure control, adjustment of the mA and/or kV according to patient size, and/or use of iterative reconstruction technique.  COMPARISON:  No relevant prior studies available.  FINDINGS:   Lung bases:  There is left basilar subsegmental atelectasis.   Pleural space:  Trace  left pleural effusion.   ABDOMEN:   Liver:  Within normal limits.   Gallbladder and bile ducts:  Within normal limits.  No calcified stones.  No ductal dilation.   Pancreas:  Within normal limits.   Spleen:  Within normal limits.   Adrenals:  Within normal limits.   Kidneys and ureters:  Within normal limits.  No solid mass.  No hydronephrosis.   Stomach and bowel:  Scattered diverticula in the colon.  No diverticulitis.   PELVIS:   Bladder:  There is are trabeculations of the urinary bladder without significant thickening.   Reproductive:  Within normal limits.   Subperitoneal space:  Small amount of blood products in the space of Retzius anterior to the urinary bladder.   ABDOMEN and PELVIS:   Intraperitoneal space:  No free air.  No significant fluid collection.   Bones/joints:  No acute fracture.   Soft tissues:  Hyperenhancing 8.6 x 5.6 x 11.3 cm fluid collection in the right rectus muscle consistent with intramuscular hematoma.       Trace right fat-containing inguinal hernia.   Vasculature:  Atherosclerotic disease.   Lymph nodes:  Within normal limits.  IMPRESSION:      1.  8.6 x 5.6 x 11.3 cm right rectus intramuscular hematoma.. 2.  Small amount of blood products in the Space of Retzius anterior to the urinary bladder. 3.  Trace left pleural effusion with atelectatic change.  Electronically signed by:  Alm March DO  10/28/2024 07:45 AM EST RP Workstation: [id number]   Assessment / Plan: Assessment & Plan Acute blood loss anemia Intramuscular hematoma -Presented with abdominal pain after mechanical fall - Imaging showed right rectus sheath intramuscular hematoma, hemoglobin 8.3 on presentation from baseline of 10 - S/p 1 unit PRBC transfusion, hemoglobin has remained stable at 9 - Surgery input noted and appreciated - Continue abdominal binder - PT/OT eval -Patient on baby aspirin  at home which is for CAD prevention, hold  Anemia -See  above  Hyperlipidemia -Continue home statin  Tremor Fall -Mechanical fall, no head injury or loss of consciousness - PT/OT eval - being worked up outpatient for Parkinson disease  Hypothyroidism -Continue home levothyroxine   Leukocytosis -WBC trending up, no signs of sepsis but patient complained of dysuria -initial UA was negative for infection but given increasing WBC and dysuria, will repeat UA. Also ordered procal -monitor with repeat CBC tomorrow    Statement of Medical Necessity:   Needs continued hospitalization for rectus sheath hematoma       [1]  Current Facility-Administered  Medications:  .  acetaminophen  (Tylenol ) tablet 650 mg, 650 mg, Oral, q4h PRN **OR** acetaminophen  (Tylenol ) solution 640 mg, 640 mg, Oral, q4h PRN, Charmaine FORBES Rattler, DO .  atorvastatin (Lipitor) tablet 10 mg, 10 mg, Oral, Daily, Charmaine FORBES Rattler, DO, 10 mg at 10/29/24 2109 .  finasteride (Proscar) tablet 5 mg, 5 mg, Oral, Nightly, Charmaine FORBES Rattler, DO, 5 mg at 10/29/24 2109 .  levothyroxine  (Synthroid , Levoxyl ) tablet 88 mcg, 88 mcg, Oral, Nightly, Charmaine FORBES Rattler, DO, 88 mcg at 10/29/24 2109 .  melatonin tablet 3 mg, 3 mg, Oral, Nightly PRN, Courtney E Stewart, DO .  metoprolol  succinate XL (Toprol -XL) 24 hr tablet 50 mg, 50 mg, Oral, Nightly, Charmaine FORBES Rattler, DO, 50 mg at 10/29/24 2109 .  ondansetron  ODT (Zofran -ODT) disintegrating tablet 4 mg, 4 mg, Oral, q6h PRN **OR** ondansetron  (Zofran ) injection 4 mg, 4 mg, Intravenous, q6h PRN, Courtney E Stewart, DO .  polyethylene glycol (PEG) 3350 (Miralax ) packet 17 g, 17 g, Oral, Daily, Courtney E Stewart, DO, 17 g at 10/29/24 0930 .  sodium chloride  (NS) 0.9 % flush 3 mL, 3 mL, Intravenous, q8h SCH, Courtney E Stewart, DO, 3 mL at 10/30/24 0600 .  sodium chloride  0.9 % infusion 250 mL, 250 mL, Intravenous, PRN, Courtney E Stewart, DO .  tamsulosin  (Flomax ) 24 hr capsule 0.4 mg, 0.4 mg, Oral, Every other day, Charmaine FORBES Rattler, DO,  0.4 mg at 10/29/24 0931 .  Therapeutic-M tablet 1 tablet, 1 tablet, Oral, Daily, Charmaine FORBES Rattler, DO, 1 tablet at 10/29/24 0930

## 2024-11-06 ENCOUNTER — Encounter: Payer: Self-pay | Admitting: Cardiovascular Disease

## 2024-11-06 ENCOUNTER — Ambulatory Visit: Attending: Cardiology | Admitting: Cardiovascular Disease

## 2024-11-06 VITALS — BP 132/73 | HR 75 | Ht 72.0 in | Wt 168.8 lb

## 2024-11-06 DIAGNOSIS — E782 Mixed hyperlipidemia: Secondary | ICD-10-CM

## 2024-11-06 DIAGNOSIS — R072 Precordial pain: Secondary | ICD-10-CM | POA: Diagnosis not present

## 2024-11-06 DIAGNOSIS — I2510 Atherosclerotic heart disease of native coronary artery without angina pectoris: Secondary | ICD-10-CM | POA: Diagnosis not present

## 2024-11-06 DIAGNOSIS — G250 Essential tremor: Secondary | ICD-10-CM

## 2024-11-06 NOTE — Patient Instructions (Addendum)
 Medication Instructions:  No changes  *If you need a refill on your cardiac medications before your next appointment, please call your pharmacy*   Lab Work: Not needed    Testing/Procedures: Your doctor has scheduled you for a  Lexiscan Myocardial Perfusion scan to obtain information about the blood flow to your heart. The test consists of taking pictures of your heart in two phases: while resting and after a stress test.  The stress test may involve walking on a treadmill, or if you are unable to exercise adequately, you will be given a drug intended to have a similar effect on the heart to that of exercise.  The test will take approximately 3 to 4  hours to complete.  If you are pregnant or breastfeeding,  please notify the staff prior to your test.  How to prepare for your test: Do not eat or drink 2 hours prior to your test Do not consume products containing caffeine 12 hours prior to your test (examples: coffee (regular OR decaf), chocolate, sodas, tea) Your doctor may need you to hold certain medications prior to the test.  If so, these are listed below and should not be taken for 24 hours prior to the test.  If not listed below, you may take your medications as normal.  You may resume taking held medications on your normal schedule once the test is complete.   Meds to hold:  none Do bring a list of your current medications with you.  If you have held any meds in preparation for the test, please bring them, as you may be required to take them once the test is completed. Do wear comfortable clothes and walking shoes.  Do not wear dresses or overalls. Do NOT wear cologne, perfume, aftershave, or fragranced lotions the day of your test (deodorants okay). If these instructions are not followed your test will have to be rescheduled.   A nuclear cardiologist will review your test, prepare a report and send it to your physician.   If you have questions or concerns about your appointment, you can  call the Nuclear Cardiology department at [phone]. If you cannot keep your appointment, please provide 48 hours notification to avoid a possible $50.00 charge to your account.   Please arrive 15 minutes prior to your appointment time for registration and insurance purposes    Follow-Up: At [facility], you and your health needs are our priority.  As part of our continuing mission to provide you with exceptional heart care, we have created designated Provider Care Teams.  These Care Teams include your primary Cardiologist (physician) and Advanced Practice Providers (APPs -  Physician Assistants and Nurse Practitioners) who all work together to provide you with the care you need, when you need it.     Your next appointment:   12 month(s)  The format for your next appointment:   In Person  Provider:   Maude Emmer, MD

## 2024-11-15 ENCOUNTER — Encounter (HOSPITAL_COMMUNITY)

## 2024-11-19 ENCOUNTER — Telehealth (HOSPITAL_COMMUNITY): Payer: Self-pay

## 2024-11-19 NOTE — Telephone Encounter (Signed)
 Detailed instructions left on the patient's answering machine. S.Tayjah Lobdell CCT

## 2024-11-22 ENCOUNTER — Other Ambulatory Visit: Payer: Self-pay | Admitting: Cardiovascular Disease

## 2024-11-22 DIAGNOSIS — R072 Precordial pain: Secondary | ICD-10-CM

## 2024-11-22 NOTE — Addendum Note (Signed)
 Addended by: Amour Trigg L on: 11/22/2024 10:36 AM   Modules accepted: Orders

## 2024-11-26 ENCOUNTER — Ambulatory Visit: Payer: Self-pay | Admitting: Cardiovascular Disease

## 2024-11-26 ENCOUNTER — Ambulatory Visit (HOSPITAL_COMMUNITY)
Admission: RE | Admit: 2024-11-26 | Discharge: 2024-11-26 | Disposition: A | Discharge status: Home / Self Care | Source: Ambulatory Visit | Attending: Cardiovascular Disease | Admitting: Cardiovascular Disease

## 2024-11-26 DIAGNOSIS — R072 Precordial pain: Secondary | ICD-10-CM | POA: Insufficient documentation

## 2024-11-26 LAB — MYOCARDIAL PERFUSION IMAGING
Base ST Depression (mm): 0 mm
LV dias vol: 116 mL (ref 62–150)
LV sys vol: 29 mL
Nuc Stress EF: 75 %
Peak HR: 83 {beats}/min
Rest HR: 55 {beats}/min
Rest Nuclear Isotope Dose: 10.9 mCi
SDS: 6
SRS: 0
SSS: 6
ST Depression (mm): 0 mm
Stress Nuclear Isotope Dose: 32.3 mCi
TID: 0.9

## 2024-11-26 MED ORDER — REGADENOSON 0.4 MG/5ML IV SOLN
INTRAVENOUS | Status: AC
  Filled 2024-11-26: qty 5

## 2024-11-26 MED ORDER — TECHNETIUM TC 99M TETROFOSMIN IV KIT
32.3000 | PACK | Freq: Once | INTRAVENOUS | Status: AC | PRN
  Administered 2024-11-26: 32.3 via INTRAVENOUS

## 2024-11-26 MED ORDER — REGADENOSON 0.4 MG/5ML IV SOLN
0.4000 mg | Freq: Once | INTRAVENOUS | Status: AC
  Administered 2024-11-26: 0.4 mg via INTRAVENOUS

## 2024-11-26 MED ORDER — TECHNETIUM TC 99M TETROFOSMIN IV KIT
10.9000 | PACK | Freq: Once | INTRAVENOUS | Status: AC | PRN
  Administered 2024-11-26: 10.9 via INTRAVENOUS

## 2024-12-04 ENCOUNTER — Telehealth: Payer: Self-pay | Admitting: Neurology

## 2024-12-04 NOTE — Telephone Encounter (Signed)
 Called and spoke to patient. He feels he has a lot going on right now and doesn't want to add to it at this time. He would like to wait and discuss  at the next visit with Dr. Evonnie

## 2024-12-04 NOTE — Telephone Encounter (Signed)
 Last visit, we discussed ordering an MRI of the brain and starting a trial of levodopa, even though we know he does not have idiopathic Parkinsons disease.  He wanted to wait until after the first of the year.  However, I do see that he has been admitted since then to the hospital and has been undergoing testing for his heart.  Ask him if he is ready to do the above testing and start levodopa, or if he wants to wait on that for now and we can discuss it at next visit

## 2024-12-05 ENCOUNTER — Ambulatory Visit: Payer: Self-pay | Admitting: Cardiovascular Disease

## 2025-04-15 ENCOUNTER — Ambulatory Visit: Admitting: Neurology
